# Patient Record
Sex: Female | Born: 1950
Health system: Southern US, Community
[De-identification: ages and names within clinical notes are randomized; demographics above are authoritative.]

## PROBLEM LIST (undated history)

## (undated) DIAGNOSIS — I10 Essential (primary) hypertension: Secondary | ICD-10-CM

## (undated) DIAGNOSIS — Z992 Dependence on renal dialysis: Secondary | ICD-10-CM

## (undated) DIAGNOSIS — I219 Acute myocardial infarction, unspecified: Secondary | ICD-10-CM

## (undated) DIAGNOSIS — R7303 Prediabetes: Secondary | ICD-10-CM

## (undated) HISTORY — PX: TUBAL LIGATION: SHX77

## (undated) HISTORY — PX: ABDOMINAL EXPLORATION SURGERY: SHX538

---

## 2015-08-17 ENCOUNTER — Emergency Department (HOSPITAL_COMMUNITY): Payer: Self-pay

## 2015-08-17 ENCOUNTER — Emergency Department (HOSPITAL_COMMUNITY)
Admission: EM | Admit: 2015-08-17 | Discharge: 2015-08-17 | Disposition: A | Discharge status: Home / Self Care | Payer: Self-pay | Attending: Emergency Medicine | Admitting: Emergency Medicine

## 2015-08-17 ENCOUNTER — Encounter (HOSPITAL_COMMUNITY): Payer: Self-pay | Admitting: Emergency Medicine

## 2015-08-17 DIAGNOSIS — J9809 Other diseases of bronchus, not elsewhere classified: Secondary | ICD-10-CM

## 2015-08-17 DIAGNOSIS — E119 Type 2 diabetes mellitus without complications: Secondary | ICD-10-CM | POA: Insufficient documentation

## 2015-08-17 DIAGNOSIS — E040 Nontoxic diffuse goiter: Secondary | ICD-10-CM | POA: Insufficient documentation

## 2015-08-17 DIAGNOSIS — J40 Bronchitis, not specified as acute or chronic: Secondary | ICD-10-CM | POA: Insufficient documentation

## 2015-08-17 DIAGNOSIS — I10 Essential (primary) hypertension: Secondary | ICD-10-CM | POA: Insufficient documentation

## 2015-08-17 DIAGNOSIS — R911 Solitary pulmonary nodule: Secondary | ICD-10-CM | POA: Insufficient documentation

## 2015-08-17 HISTORY — DX: Essential (primary) hypertension: I10

## 2015-08-17 LAB — BASIC METABOLIC PANEL
ANION GAP: 8 (ref 5–15)
BUN: 22 mg/dL — ABNORMAL HIGH (ref 6–20)
CALCIUM: 8.9 mg/dL (ref 8.9–10.3)
CO2: 27 mmol/L (ref 22–32)
CREATININE: 1.33 mg/dL — AB (ref 0.44–1.00)
Chloride: 105 mmol/L (ref 101–111)
GFR, EST AFRICAN AMERICAN: 49 mL/min — AB (ref >60)
GFR, EST NON AFRICAN AMERICAN: 42 mL/min — AB (ref >60)
Glucose, Bld: 196 mg/dL — ABNORMAL HIGH (ref 65–99)
Potassium: 4.3 mmol/L (ref 3.5–5.1)
SODIUM: 140 mmol/L (ref 135–145)

## 2015-08-17 LAB — I-STAT TROPONIN, ED: TROPONIN I, POC: 0.01 ng/mL (ref 0.00–0.08)

## 2015-08-17 LAB — CBC
HCT: 38.1 % (ref 36.0–46.0)
HEMOGLOBIN: 12.6 g/dL (ref 12.0–15.0)
MCH: 28.9 pg (ref 26.0–34.0)
MCHC: 33.1 g/dL (ref 30.0–36.0)
MCV: 87.4 fL (ref 78.0–100.0)
PLATELETS: 276 10*3/uL (ref 150–400)
RBC: 4.36 MIL/uL (ref 3.87–5.11)
RDW: 12.8 % (ref 11.5–15.5)
WBC: 7.6 10*3/uL (ref 4.0–10.5)

## 2015-08-17 LAB — D-DIMER, QUANTITATIVE (NOT AT ARMC): D DIMER QUANT: 0.56 ug{FEU}/mL — AB (ref 0.00–0.50)

## 2015-08-17 MED ORDER — SODIUM CHLORIDE 0.9 % IV BOLUS (SEPSIS)
1000.0000 mL | Freq: Once | INTRAVENOUS | Status: AC
  Administered 2015-08-17: 1000 mL via INTRAVENOUS

## 2015-08-17 MED ORDER — ALBUTEROL SULFATE (2.5 MG/3ML) 0.083% IN NEBU
2.5000 mg | INHALATION_SOLUTION | Freq: Four times a day (QID) | RESPIRATORY_TRACT | Status: DC | PRN
Start: 2015-08-17 — End: 2018-03-17

## 2015-08-17 MED ORDER — IPRATROPIUM-ALBUTEROL 0.5-2.5 (3) MG/3ML IN SOLN
3.0000 mL | Freq: Once | RESPIRATORY_TRACT | Status: AC
  Administered 2015-08-17: 3 mL via RESPIRATORY_TRACT
  Filled 2015-08-17: qty 3

## 2015-08-17 MED ORDER — ALBUTEROL SULFATE HFA 108 (90 BASE) MCG/ACT IN AERS: 1.0000 | INHALATION_SPRAY | Freq: Four times a day (QID) | RESPIRATORY_TRACT | Status: DC | PRN

## 2015-08-17 MED ORDER — IOHEXOL 350 MG/ML SOLN
100.0000 mL | Freq: Once | INTRAVENOUS | Status: AC | PRN
  Administered 2015-08-17: 100 mL via INTRAVENOUS

## 2015-08-17 NOTE — ED Notes (Signed)
Erika Bates reports difficult IV stick; this RN unsuccessful attempt to right hand. IV team consult in place.

## 2015-08-17 NOTE — ED Provider Notes (Signed)
CSN: [id number]     Arrival date & time 08/17/15  1421 History   First MD Initiated Contact with Patient 08/17/15 1559     Chief Complaint  Patient presents with  . Chest Pain  . Shortness of Breath     (Consider location/radiation/quality/duration/timing/severity/associated sxs/prior Treatment) The history is provided by the patient.  Telecia Larocque is a 64 y.o. female hx of HTN, DM, who presented with shortness of breath. She just moved here from a [place] close to [place]. Acute onset of shortness of breath since yesterday. Associated with some diaphoresis. Also has nonproductive cough. Denies any fevers or chills. No known CAD or cardiac stents. She did run out of her albuterol.   Past Medical History  Diagnosis Date  . Hypertension   . Diabetes mellitus without complication (HCC)    History reviewed. No pertinent past surgical history. History reviewed. No pertinent family history. Social History  Substance Use Topics  . Smoking status: Never Smoker   . Smokeless tobacco: None  . Alcohol Use: No   OB History    No data available     Review of Systems  Respiratory: Positive for shortness of breath.   All other systems reviewed and are negative.     Allergies  Review of patient's allergies indicates no known allergies.  Home Medications   Prior to Admission medications   Medication Sig Start Date End Date Taking? Authorizing Provider  DM-Doxylamine-Acetaminophen (NYQUIL COLD & FLU PO) Take 15 mLs by mouth every 6 (six) hours as needed (cold symptoms).   Yes Historical Provider, MD   BP 127/69 mmHg  Pulse 65  Resp 20  SpO2 100% Physical Exam  Constitutional: She is oriented to person, place, and time. She appears well-developed and well-nourished.  HENT:  Head: Normocephalic.  Eyes: Conjunctivae are normal. Pupils are equal, round, and reactive to light.  Neck: Normal range of motion. Neck supple.  Cardiovascular: Normal rate, regular rhythm and  normal heart sounds.   Pulmonary/Chest:  Diminished breath sounds. No wheezing   Abdominal: Soft. Bowel sounds are normal. She exhibits no distension. There is no tenderness. There is no rebound.  Musculoskeletal: Normal range of motion. She exhibits no edema or tenderness.  Neurological: She is alert and oriented to person, place, and time. Coordination normal.  Skin: Skin is warm and dry.  Psychiatric: She has a normal mood and affect. Her behavior is normal. Judgment and thought content normal.  Nursing note and vitals reviewed.   ED Course  Procedures (including critical care time) Labs Review Labs Reviewed  BASIC METABOLIC PANEL - Abnormal; Notable for the following:    Glucose, Bld 196 (*)    BUN 22 (*)    Creatinine, Ser 1.33 (*)    GFR calc non Af Amer 42 (*)    GFR calc Af Amer 49 (*)    All other components within normal limits  D-DIMER, QUANTITATIVE (NOT AT [facility]) - Abnormal; Notable for the following:    D-Dimer, Quant 0.56 (*)    All other components within normal limits  CBC  I-STAT TROPOININ, ED    Imaging Review Dg Chest 2 View  08/17/2015  CLINICAL DATA:  Cough for 1 week with worsening shortness of breath. Initial encounter. EXAM: CHEST  2 VIEW COMPARISON:  None. FINDINGS: A calcified granuloma is seen in the left lung. The lungs are otherwise clear. Heart size is normal. No pneumothorax or pleural effusion. No focal bony abnormality. IMPRESSION: No acute disease. Electronically Signed  By: Drusilla Kanner M.D.   On: 08/17/2015 15:35   Ct Angio Chest Pe W/cm &/or Wo Cm  08/17/2015  CLINICAL DATA:  Patient with intermittent left-sided chest pain radiating to the thorax. Shortness of breath. EXAM: CT ANGIOGRAPHY CHEST WITH CONTRAST TECHNIQUE: Multidetector CT imaging of the chest was performed using the standard protocol during bolus administration of intravenous contrast. Multiplanar CT image reconstructions and MIPs were obtained to evaluate the vascular  anatomy. CONTRAST:  OMNIPAQUE IOHEXOL 350 MG/ML SOLN COMPARISON:  Chest radiograph earlier same date. FINDINGS: Mediastinum/Nodes: No enlarged axillary, mediastinal or hilar lymphadenopathy. Normal heart size. No pericardial effusion. Small hiatal hernia. Adequate opacification the main pulmonary artery. No evidence for pulmonary embolism. Lungs/Pleura: Central airways are patent. There is mosaic attenuation to the lungs bilaterally. Within the right upper lobe there is a 7 mm nodule (image 79; series 10). Additionally there is an adjacent 6 mm right upper lobe nodule (image 76; series 10). Within the left upper lobe there is a low attenuation branching tubular structure (image 78; series 10), most compatible with mucous filled bronchus/bronchocele. There is a 14 mm calcified granuloma within the left lower lobe. No pleural effusion or pneumothorax. Upper abdomen: Unremarkable Musculoskeletal: No aggressive or acute appearing osseous lesions. Review of the MIP images confirms the above findings. IMPRESSION: No evidence for acute pulmonary embolism. Mosaic attenuation to the lungs. This may be secondary to small airways disease versus vascular etiology. Favor small airways disease. Consider followup evaluation with high-resolution chest CT including inspiration and expiration imaging. Right upper lobe pulmonary nodules measuring up to 7 mm. Recommend follow-up chest CT in 3 months. This recommendation follows the consensus statement: Guidelines for Management of Small Pulmonary Nodules Detected on CT Scans: A Statement from the Fleischner Society as published in Radiology 2005; 237:395-400. Low-density branching tubular structure within the left upper lobe favored to represent a bronchocele. Attention on above recommended followup chest CTs is recommended. Electronically Signed   By: Annia Belt M.D.   On: 08/17/2015 21:20   I have personally reviewed and evaluated these images and lab results as part of my  medical decision-making.   EKG Interpretation   Date/Time:  Friday August 17 2015 14:35:48 EST Ventricular Rate:  75 PR Interval:  166 QRS Duration: 74 QT Interval:  380 QTC Calculation: 424 R Axis:   28 Text Interpretation:  Sinus rhythm Low voltage, precordial leads No old  tracing to compare Confirmed by KNAPP  MD-J, JON ([id number]) on 08/17/2015  2:43:51 PM      MDM   Final diagnoses:  None    Anysha Nordness is a 64 y.o. female here with shortness of breath, chest pain. She traveled here recently. Consider ACS (symptoms for a day so trop x 1 sufficient), bronchitis, pneumonia, PE. Will get labs, d-dimer, trop x 1, CXR.  9:30 PM D-dimer mildly elevated. CT showed no PE. Has pulmonary nodules and bronchocele. No wheezing and has improved air movement. Will dc home with prn albuterol. No need for steroids currently.     Richardean Canal, MD 08/17/15 2130

## 2015-08-17 NOTE — Discharge Instructions (Signed)
Use either albuterol pump or nebulizer every 4 hrs for shortness of breath.   You have lung nodule that needs to be followed up with imaging in several months.   See your doctor.   Return to ER if you have worse shortness of breath, chest pain, fevers.

## 2015-08-17 NOTE — ED Notes (Signed)
Pt c/o intermittent left side CP radiating through thorax to back, onset last night with SOB worsening on laying down.

## 2015-08-17 NOTE — Progress Notes (Signed)
EDCM spoke to patient and her daughter at bedside. Patient does not speak english.  Patient's daughter confirms patient does not have a pcp or insurance living in [place].  Patient is visiting here. [facility] provided patient with pamphlet to [facility], informed patient of services there.  EDCM also provided patient with list of pcps who accept self pay patients, list of discount pharmacies and websites needymeds.org and GoodRX.com for medication assistance, phone number to inquire about the orange card, phone number to inquire about Mediciad, phone number to inquire about the Affordable Care Act, financial resources in the community such as local churches, salvation army, urban ministries, and dental assistance for uninsured patients.  Patient thankful for resources.  No further EDCM needs at this time.

## 2015-08-17 NOTE — ED Notes (Signed)
Patient calls out for worsening SOB. Lung sounds clear throughout, VS stable. Pt diaphoretic in cold room, was not diaphoretic on initial exam.

## 2017-07-22 ENCOUNTER — Emergency Department (HOSPITAL_COMMUNITY): Payer: Self-pay

## 2017-07-22 ENCOUNTER — Emergency Department (HOSPITAL_COMMUNITY)
Admission: EM | Admit: 2017-07-22 | Discharge: 2017-07-22 | Disposition: A | Discharge status: Home / Self Care | Payer: Self-pay | Attending: Emergency Medicine | Admitting: Emergency Medicine

## 2017-07-22 ENCOUNTER — Encounter (HOSPITAL_COMMUNITY): Payer: Self-pay | Admitting: *Deleted

## 2017-07-22 DIAGNOSIS — N183 Chronic kidney disease, stage 3 unspecified: Secondary | ICD-10-CM

## 2017-07-22 DIAGNOSIS — M5442 Lumbago with sciatica, left side: Secondary | ICD-10-CM | POA: Insufficient documentation

## 2017-07-22 DIAGNOSIS — E119 Type 2 diabetes mellitus without complications: Secondary | ICD-10-CM | POA: Insufficient documentation

## 2017-07-22 DIAGNOSIS — I10 Essential (primary) hypertension: Secondary | ICD-10-CM | POA: Insufficient documentation

## 2017-07-22 DIAGNOSIS — N12 Tubulo-interstitial nephritis, not specified as acute or chronic: Secondary | ICD-10-CM | POA: Insufficient documentation

## 2017-07-22 LAB — URINALYSIS, ROUTINE W REFLEX MICROSCOPIC
BILIRUBIN URINE: NEGATIVE
Glucose, UA: NEGATIVE mg/dL
KETONES UR: NEGATIVE mg/dL
LEUKOCYTES UA: NEGATIVE
Nitrite: POSITIVE — AB
Protein, ur: 30 mg/dL — AB
SPECIFIC GRAVITY, URINE: 1.012 (ref 1.005–1.030)
pH: 5 (ref 5.0–8.0)

## 2017-07-22 LAB — BASIC METABOLIC PANEL
ANION GAP: 8 (ref 5–15)
BUN: 13 mg/dL (ref 6–20)
CALCIUM: 9.3 mg/dL (ref 8.9–10.3)
CO2: 24 mmol/L (ref 22–32)
Chloride: 106 mmol/L (ref 101–111)
Creatinine, Ser: 1.05 mg/dL — ABNORMAL HIGH (ref 0.44–1.00)
GFR calc Af Amer: >60 mL/min (ref >60)
GFR, EST NON AFRICAN AMERICAN: 55 mL/min — AB (ref >60)
GLUCOSE: 115 mg/dL — AB (ref 65–99)
POTASSIUM: 3.8 mmol/L (ref 3.5–5.1)
Sodium: 138 mmol/L (ref 135–145)

## 2017-07-22 LAB — CBC WITH DIFFERENTIAL/PLATELET
BASOS ABS: 0 10*3/uL (ref 0.0–0.1)
BASOS PCT: 0 %
EOS ABS: 0.3 10*3/uL (ref 0.0–0.7)
Eosinophils Relative: 4 %
HCT: 38.3 % (ref 36.0–46.0)
HEMOGLOBIN: 13 g/dL (ref 12.0–15.0)
Lymphocytes Relative: 54 %
Lymphs Abs: 4.1 10*3/uL — ABNORMAL HIGH (ref 0.7–4.0)
MCH: 28.8 pg (ref 26.0–34.0)
MCHC: 33.9 g/dL (ref 30.0–36.0)
MCV: 84.9 fL (ref 78.0–100.0)
Monocytes Absolute: 0.6 10*3/uL (ref 0.1–1.0)
Monocytes Relative: 7 %
NEUTROS ABS: 2.7 10*3/uL (ref 1.7–7.7)
NEUTROS PCT: 35 %
PLATELETS: 254 10*3/uL (ref 150–400)
RBC: 4.51 MIL/uL (ref 3.87–5.11)
RDW: 13 % (ref 11.5–15.5)
WBC: 7.7 10*3/uL (ref 4.0–10.5)

## 2017-07-22 MED ORDER — CEPHALEXIN 500 MG PO CAPS: 500.0000 mg | ORAL_CAPSULE | Freq: Three times a day (TID) | ORAL | 0 refills | Status: AC

## 2017-07-22 NOTE — ED Provider Notes (Addendum)
[facility] EMERGENCY DEPARTMENT Provider Note   CSN: [id number] Arrival date & time: 07/22/17  4098     History   Chief Complaint Chief Complaint  Patient presents with  . Leg Pain  . Back Pain    HPI Erika Bates is a 66 y.o. female who presents for evaluation of Left lower lateral back pain that occasionally radiates down her leg. She reports that her pain has been gradually worsening for three days.  She denies any dysuria, does endorse increased frequency and urgency.  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  She does not know what her diagnosis was.  She denies any numbness or tingling to her bilateral legs or genitals.  No changes to bowel or bladder function.    She says that she does not normally have lower back pain at baseline and this is not normal for her.   HPI  Past Medical History:  Diagnosis Date  . Diabetes mellitus without complication (HCC)   . Hypertension     Patient Active Problem List   Diagnosis Date Noted  . History of kidney problems 07/22/2017    History reviewed. No pertinent surgical history.  OB History    No data available       Home Medications    Prior to Admission medications   Medication Sig Start Date End Date Taking? Authorizing Provider  albuterol (PROVENTIL HFA;VENTOLIN HFA) 108 (90 BASE) MCG/ACT inhaler Inhale 1-2 puffs into the lungs every 6 (six) hours as needed for wheezing or shortness of breath. 08/17/15   Charlynne Pander, MD  albuterol (PROVENTIL) (2.5 MG/3ML) 0.083% nebulizer solution Take 3 mLs (2.5 mg total) by nebulization every 6 (six) hours as needed for wheezing or shortness of breath. 08/17/15   Charlynne Pander, MD  cephALEXin (KEFLEX) 500 MG capsule Take 1 capsule (500 mg total) by mouth 3 (three) times daily. 07/22/17 08/05/17  Cristina Gong, PA-C  DM-Doxylamine-Acetaminophen (NYQUIL COLD  & FLU PO) Take 15 mLs by mouth every 6 (six) hours as needed (cold symptoms).    [provider]    Family History No family history on file.  Social History Social History  Substance Use Topics  . Smoking status: Never Smoker  . Smokeless tobacco: Not on file  . Alcohol use No     Allergies   Patient has no known allergies.   Review of Systems Review of Systems  Constitutional: Negative for chills and fever.  HENT: Negative for ear pain and sore throat.   Eyes: Negative for pain and visual disturbance.  Respiratory: Negative for cough and shortness of breath.   Cardiovascular: Negative for chest pain and palpitations.  Gastrointestinal: Negative for abdominal pain and vomiting.  Genitourinary: Positive for frequency and urgency. Negative for dysuria and hematuria.  Musculoskeletal: Positive for back pain. Negative for arthralgias.       Leg pain  Skin: Negative for color change and rash.  Neurological: Negative for seizures, syncope and numbness.  All other systems reviewed and are negative.    Physical Exam Updated Vital Signs BP (!) 147/72 (BP Location: Right Arm)   Pulse 61   Temp 97.6 F (36.4 C)   Resp 16   SpO2 98%   Physical Exam  Constitutional: She appears well-developed and well-nourished. No distress.  HENT:  Head: Normocephalic and atraumatic.  Eyes: Conjunctivae are normal.  Neck:  Neck supple.  Cardiovascular: Normal rate and regular rhythm.  No murmur heard. Pulmonary/Chest: Effort normal and breath sounds normal. No respiratory distress.  Abdominal: Soft. Normal appearance and bowel sounds are normal. She exhibits no distension. There is no tenderness. There is CVA tenderness (on the left side. ).  Musculoskeletal: Normal range of motion. She exhibits no edema.  5/5 strength to bilateral upper and lower extremities including plantar dorsi-flexion, knee, hip, elbow, and wrists.   She has TTP over lumbar paraspinal muscles.  No midline  lumbar tenderness.   Neurological: She is alert.  Sensation intact to bilateral lower extremities.    Skin: Skin is warm and dry.  Psychiatric: She has a normal mood and affect.  Nursing note and vitals reviewed.    ED Treatments / Results  Labs (all labs ordered are listed, but only abnormal results are displayed) Labs Reviewed  URINE CULTURE - Abnormal; Notable for the following:       Result Value   Culture   (*)    Value: >=100,000 COLONIES/mL ESCHERICHIA COLI SUSCEPTIBILITIES TO FOLLOW    All other components within normal limits  URINALYSIS, ROUTINE W REFLEX MICROSCOPIC - Abnormal; Notable for the following:    APPearance HAZY (*)    Hgb urine dipstick SMALL (*)    Protein, ur 30 (*)    Nitrite POSITIVE (*)    Bacteria, UA RARE (*)    Squamous Epithelial / LPF 0-5 (*)    All other components within normal limits  CBC WITH DIFFERENTIAL/PLATELET - Abnormal; Notable for the following:    Lymphs Abs 4.1 (*)    All other components within normal limits  BASIC METABOLIC PANEL - Abnormal; Notable for the following:    Glucose, Bld 115 (*)    Creatinine, Ser 1.05 (*)    GFR calc non Af Amer 55 (*)    All other components within normal limits    EKG  EKG Interpretation None       Radiology Dg Lumbar Spine Complete  Result Date: 07/22/2017 CLINICAL DATA:  Increasing lower back pain that radiates into the left hip and upper leg. Symptoms for several days. No known injury. EXAM: LUMBAR SPINE - COMPLETE 4+ VIEW COMPARISON:  None. FINDINGS: No convincing fracture; superior endplate concavity at L5 has a chronic smooth appearance. Mild spurring and L2-3 disc narrowing. Slight levocurvature. No listhesis. Prominent osteopenia. The lowest lumbar vertebra has a pseudoarticulation with the sacrum at its right transverse process. IMPRESSION: 1. No acute finding. 2. Osteopenia. Electronically Signed   By: Marnee Spring M.D.   On: 07/22/2017 13:09    Procedures Procedures  (including critical care time)  Medications Ordered in ED Medications - No data to display   Initial Impression / Assessment and Plan / ED Course  I have reviewed the triage vital signs and the nursing notes.  Pertinent labs & imaging results that were available during my care of the patient were reviewed by me and considered in my medical decision making (see chart for details).  Clinical Course as of Jul 23 1617  Wed Jul 22, 2017  1246 Attempted to see patient, she is not in room.   [EH]    Clinical Course User Index [EH] Cristina Gong, PA-C   Patient with back pain.  No neurological deficits and normal neuro exam.  Patient can walk but states is painful.  No loss of bowel or bladder control.  No concern for cauda equina.  No fever, night sweats, weight  loss, h/o cancer, IVDU.  RICE protocol and pain medicine indicated and discussed with patient. On UA she was found to have urine that was hazy, small blood, nitrite positive, with 0-5 wbc.  Given that she has back pain I feel this represents pyelonephritis.  Given her history of requiring dialysis and kidney problems in the past along with chills a CBC and a BMP were obtained.  Her Cr is better than her last set of labs and is slightly outside the reference range but essentially normal.  WBC with out leukocytosis or neutropenia.  She is afebrile, not tachycardic.  Does not meet sepsis criteria.  I feel like she is safe for outpatient trial of antibiotics.  She was given strict return precautions for both her back pain and pyelonephritis and states her understanding.      Final Clinical Impressions(s) / ED Diagnoses   Final diagnoses:  Acute left-sided low back pain with left-sided sciatica  Pyelonephritis    New Prescriptions Discharge Medication List as of 07/22/2017  4:13 PM    START taking these medications   Details  cephALEXin (KEFLEX) 500 MG capsule Take 1 capsule (500 mg total) by mouth 3 (three) times daily.,  Starting Wed 07/22/2017, Until Wed 08/05/2017, Print         Cristina GongHammond, Damarion Mendizabal W, PA-C 07/23/17 1624    Cathren LaineSteinl, Kevin, MD 07/24/17 1405    Cristina GongHammond, Maylee Bare W, PA-C 08/04/17 1511    Cathren LaineSteinl, Kevin, MD 08/07/17 1240

## 2017-07-22 NOTE — ED Notes (Signed)
Patient transported to X-ray 

## 2017-07-22 NOTE — Discharge Instructions (Signed)
Please take Tylenol (acetaminophen) to relieve your pain.  You may take tylenol, up to 1,000 mg (two extra strength pills).  Do not take more than 3,000 mg tylenol in a 24 hour period.  Please check all medication labels as many medications such as pain and cold medications may contain tylenol. Please do not drink alcohol while taking this medication.  ° °You may have diarrhea from the antibiotics.  It is very important that you continue to take the antibiotics even if you get diarrhea unless a medical professional tells you that you may stop taking them.  If you stop too early the bacteria you are being treated for will become stronger and you may need different, more powerful antibiotics that have more side effects and worsening diarrhea.  Please stay well hydrated and consider probiotics as they may decrease the severity of your diarrhea.   ° °

## 2017-07-22 NOTE — ED Triage Notes (Signed)
To ED for eval of lower back pain, left hip pain, and left leg pain for the past 3 days. Pt states the pain is worse with walking. No injury noted. Denies difficulty with urination or bm

## 2017-07-24 LAB — URINE CULTURE

## 2017-07-25 ENCOUNTER — Telehealth: Payer: Self-pay

## 2017-07-25 NOTE — Progress Notes (Signed)
ED Antimicrobial Stewardship Positive Culture Follow Up   Erika Bates is an 66 y.o. female who presented to [facility] on 07/22/2017 with a chief complaint of left lower lateral back pain that radiates to leg and dysuria. Chief Complaint  Patient presents with  . Leg Pain  . Back Pain    Recent Results (from the past 720 hour(s))  Urine culture     Status: Abnormal   Collection Time: 07/22/17  2:14 PM  Result Value Ref Range Status   Specimen Description URINE, CLEAN CATCH  Final   Special Requests NONE  Final   Culture >=100,000 COLONIES/mL ESCHERICHIA COLI (A)  Final   Report Status 07/24/2017 FINAL  Final   Organism ID, Bacteria ESCHERICHIA COLI (A)  Final      Susceptibility   Escherichia coli - MIC*    AMPICILLIN >=32 RESISTANT Resistant     CEFAZOLIN 32 INTERMEDIATE Intermediate     CEFTRIAXONE <=1 SENSITIVE Sensitive     CIPROFLOXACIN <=0.25 SENSITIVE Sensitive     GENTAMICIN <=1 SENSITIVE Sensitive     IMIPENEM <=0.25 SENSITIVE Sensitive     NITROFURANTOIN 32 SENSITIVE Sensitive     TRIMETH/SULFA >=320 RESISTANT Resistant     AMPICILLIN/SULBACTAM >=32 RESISTANT Resistant     PIP/TAZO <=4 SENSITIVE Sensitive     Extended ESBL NEGATIVE Sensitive     * >=100,000 COLONIES/mL ESCHERICHIA COLI    [x]  Treated with cephalexin 500 mg TID, organism resistant to prescribed antimicrobial   New antibiotic prescription: Ciprofloxacin 500 mg twice daily x 14 days   ED Provider: Dietrich Pates, PA-C   Vinnie Level, PharmD., BCPS Clinical Pharmacist Pager [phone]

## 2017-07-25 NOTE — Telephone Encounter (Incomplete)
Post ED Visit - Positive Culture Follow-up: Successful Patient Follow-Up  Culture assessed and recommendations reviewed by: []  Enzo Bi, Pharm.D. []  Celedonio Miyamoto, Pharm.D., BCPS AQ-ID []  Garvin Fila, Pharm.D., BCPS []  Georgina Pillion, Pharm.D., BCPS []  [place], [address].D., BCPS, AAHIVP []  Estella Husk, Pharm.D., BCPS, AAHIVP []  Lysle Pearl, PharmD, BCPS []  Casilda Carls, PharmD, BCPS []  Pollyann Samples, PharmD, BCPS [facility] Pharm D Positive urine culture  []  Patient discharged without antimicrobial prescription and treatment is now indicated [x]  Organism is resistant to prescribed ED discharge antimicrobial []  Patient with positive blood cultures  Changes discussed with ED provider: Dietrich Pates PA New antibiotic prescription Ciprofloxacin 500 mg BID x 14 days Called to [facility] city/[address] [phone]  Contacted patient, date 07/25/17, time 1201   Erika Bates, Linnell Fulling 07/25/2017, 11:55 AM

## 2017-12-16 ENCOUNTER — Emergency Department (HOSPITAL_COMMUNITY)
Admission: EM | Admit: 2017-12-16 | Discharge: 2017-12-16 | Discharge status: Against Medical Advice | Payer: Self-pay | Attending: Emergency Medicine | Admitting: Emergency Medicine

## 2017-12-16 ENCOUNTER — Encounter (HOSPITAL_COMMUNITY): Payer: Self-pay | Admitting: Emergency Medicine

## 2017-12-16 DIAGNOSIS — Z5321 Procedure and treatment not carried out due to patient leaving prior to being seen by health care provider: Secondary | ICD-10-CM | POA: Insufficient documentation

## 2017-12-16 NOTE — ED Triage Notes (Signed)
Per [facility] pt from home for abd pain (RUQ) that started today after having a "coughing fit". Is worse with palpation. Denies vomiting.

## 2017-12-16 NOTE — ED Notes (Signed)
Pt was stuck twice in triage by phlebotomy in each ac. There was a flash of blood but no flow. Pt's veins are small and apparently roll for blood draw. Pt also stated to phlebotomy, " when I was in [place] I had dialysis".

## 2018-01-27 DIAGNOSIS — I219 Acute myocardial infarction, unspecified: Secondary | ICD-10-CM

## 2018-01-27 HISTORY — DX: Acute myocardial infarction, unspecified: I21.9

## 2018-02-11 ENCOUNTER — Other Ambulatory Visit: Payer: Self-pay

## 2018-02-11 ENCOUNTER — Encounter (HOSPITAL_COMMUNITY): Payer: Self-pay | Admitting: Emergency Medicine

## 2018-02-11 DIAGNOSIS — J0100 Acute maxillary sinusitis, unspecified: Secondary | ICD-10-CM | POA: Insufficient documentation

## 2018-02-11 DIAGNOSIS — R51 Headache: Secondary | ICD-10-CM | POA: Insufficient documentation

## 2018-02-11 NOTE — ED Triage Notes (Signed)
Pt from home with c/o headache that radiates from head to back that began last night. Pt denies deficits and none are visible. Unremarkable neuro exam and pupils are equal and responsive. Pt states headache began when she lifted grandchild last night. Pt report sensitivity to light.

## 2018-02-12 ENCOUNTER — Emergency Department (HOSPITAL_COMMUNITY)
Admission: EM | Admit: 2018-02-12 | Discharge: 2018-02-12 | Disposition: A | Discharge status: Home / Self Care | Payer: Self-pay | Attending: Emergency Medicine | Admitting: Emergency Medicine

## 2018-02-12 ENCOUNTER — Emergency Department (HOSPITAL_COMMUNITY): Payer: Self-pay

## 2018-02-12 DIAGNOSIS — G4489 Other headache syndrome: Secondary | ICD-10-CM

## 2018-02-12 DIAGNOSIS — J0100 Acute maxillary sinusitis, unspecified: Secondary | ICD-10-CM

## 2018-02-12 LAB — CBC WITH DIFFERENTIAL/PLATELET
BASOS ABS: 0.1 10*3/uL (ref 0.0–0.1)
BASOS PCT: 1 %
Eosinophils Absolute: 0.5 10*3/uL (ref 0.0–0.7)
Eosinophils Relative: 6 %
HCT: 39.6 % (ref 36.0–46.0)
Hemoglobin: 13.3 g/dL (ref 12.0–15.0)
Lymphocytes Relative: 52 %
Lymphs Abs: 4.3 10*3/uL — ABNORMAL HIGH (ref 0.7–4.0)
MCH: 29 pg (ref 26.0–34.0)
MCHC: 33.6 g/dL (ref 30.0–36.0)
MCV: 86.3 fL (ref 78.0–100.0)
MONO ABS: 0.6 10*3/uL (ref 0.1–1.0)
MONOS PCT: 7 %
Neutro Abs: 2.8 10*3/uL (ref 1.7–7.7)
Neutrophils Relative %: 34 %
Platelets: 259 10*3/uL (ref 150–400)
RBC: 4.59 MIL/uL (ref 3.87–5.11)
RDW: 13 % (ref 11.5–15.5)
WBC: 8.1 10*3/uL (ref 4.0–10.5)

## 2018-02-12 LAB — BASIC METABOLIC PANEL
Anion gap: 9 (ref 5–15)
BUN: 16 mg/dL (ref 6–20)
CALCIUM: 9.4 mg/dL (ref 8.9–10.3)
CO2: 26 mmol/L (ref 22–32)
Chloride: 102 mmol/L (ref 101–111)
Creatinine, Ser: 1.21 mg/dL — ABNORMAL HIGH (ref 0.44–1.00)
GFR calc Af Amer: 54 mL/min — ABNORMAL LOW (ref >60)
GFR, EST NON AFRICAN AMERICAN: 46 mL/min — AB (ref >60)
GLUCOSE: 157 mg/dL — AB (ref 65–99)
Potassium: 3.9 mmol/L (ref 3.5–5.1)
Sodium: 137 mmol/L (ref 135–145)

## 2018-02-12 LAB — SEDIMENTATION RATE: SED RATE: 37 mm/h — AB (ref 0–22)

## 2018-02-12 MED ORDER — DIPHENHYDRAMINE HCL 50 MG/ML IJ SOLN
25.0000 mg | Freq: Once | INTRAMUSCULAR | Status: AC
  Administered 2018-02-12: 25 mg via INTRAVENOUS
  Filled 2018-02-12: qty 1

## 2018-02-12 MED ORDER — METOCLOPRAMIDE HCL 5 MG/ML IJ SOLN
10.0000 mg | Freq: Once | INTRAMUSCULAR | Status: AC
  Administered 2018-02-12: 10 mg via INTRAVENOUS
  Filled 2018-02-12: qty 2

## 2018-02-12 MED ORDER — AMOXICILLIN 500 MG PO CAPS: 500.0000 mg | ORAL_CAPSULE | Freq: Three times a day (TID) | ORAL | 0 refills | Status: DC

## 2018-02-12 NOTE — ED Provider Notes (Signed)
[facility]-EMERGENCY DEPT Provider Note   CSN: [id number] Arrival date & time: 02/11/18  2149     History   Chief Complaint Chief Complaint  Patient presents with  . Headache    HPI Erika Bates is a 67 y.o. female.  The history is provided by the patient and a relative.  Headache   This is a new problem. The current episode started 12 to 24 hours ago. The problem occurs constantly. The problem has been gradually worsening. The pain is moderate. The pain radiates to the upper back. Associated symptoms include malaise/fatigue. Pertinent negatives include no fever, no nausea and no vomiting. She has tried acetaminophen for the symptoms. The treatment provided no relief.  pt reports onset of HA over 24 hrs ago It is gradually worsening It is throughout the head and radiates into back No fever/vomiting No focal weakness No confusion per family No recent trauma She thought it started after picking up her grandchild No previous history of stroke   History is provided by daughter.  Patient is [place] and no immediate medical interpreter available  Past Medical History:  Diagnosis Date  . Diabetes mellitus without complication (HCC)    prediabetic   . Hypertension     Patient Active Problem List   Diagnosis Date Noted  . History of kidney problems 07/22/2017    History reviewed. No pertinent surgical history.   OB History   None      Home Medications    Prior to Admission medications   Medication Sig Start Date End Date Taking? Authorizing Provider  albuterol (PROVENTIL HFA;VENTOLIN HFA) 108 (90 BASE) MCG/ACT inhaler Inhale 1-2 puffs into the lungs every 6 (six) hours as needed for wheezing or shortness of breath. Patient not taking: Reported on 02/12/2018 08/17/15   Charlynne Pander, MD  albuterol (PROVENTIL) (2.5 MG/3ML) 0.083% nebulizer solution Take 3 mLs (2.5 mg total) by nebulization every 6 (six) hours as needed for wheezing or  shortness of breath. Patient not taking: Reported on 02/12/2018 08/17/15   Charlynne Pander, MD    Family History No family history on file.  Social History Social History   Tobacco Use  . Smoking status: Never Smoker  . Smokeless tobacco: Never Used  Substance Use Topics  . Alcohol use: No  . Drug use: No     Allergies   Patient has no known allergies.   Review of Systems Review of Systems  Constitutional: Positive for malaise/fatigue. Negative for fever.  Cardiovascular: Negative for chest pain.  Gastrointestinal: Negative for nausea and vomiting.  Neurological: Positive for headaches. Negative for syncope and weakness.  All other systems reviewed and are negative.    Physical Exam Updated Vital Signs BP 118/73 (BP Location: Left Arm)   Pulse 61   Temp 98 F (36.7 C) (Oral)   Resp 20   SpO2 100%   Physical Exam CONSTITUTIONAL: Elderly, ill-appearing HEAD: Normocephalic/atraumatic EYES: EOMI/PERRL, no nystagmus, no ptosis ENMT: Mucous membranes moist NECK: supple no meningeal signs, no bruits SPINE/BACK:entire spine nontender CV: S1/S2 noted, no murmurs/rubs/gallops noted LUNGS: Lungs are clear to auscultation bilaterally, no apparent distress ABDOMEN: soft, nontender, no rebound or guarding GU:no cva tenderness NEURO:Awake/alert, face symmetric, no arm or leg drift is noted Equal 5/5 strength with shoulder abduction, elbow flex/extension, wrist flex/extension in upper extremities and equal hand grips bilaterally Equal 5/5 strength with hip flexion,knee flex/extension, foot dorsi/plantar flexion Cranial nerves 3/4/5/6/04/06/09/11/12 tested and intact EXTREMITIES: pulses normal, full ROM SKIN: warm, color  normal PSYCH: no abnormalities of mood noted, alert and oriented to situation    ED Treatments / Results  Labs (all labs ordered are listed, but only abnormal results are displayed) Labs Reviewed  BASIC METABOLIC PANEL - Abnormal; Notable for the  following components:      Result Value   Glucose, Bld 157 (*)    Creatinine, Ser 1.21 (*)    GFR calc non Af Amer 46 (*)    GFR calc Af Amer 54 (*)    All other components within normal limits  CBC WITH DIFFERENTIAL/PLATELET - Abnormal; Notable for the following components:   Lymphs Abs 4.3 (*)    All other components within normal limits  SEDIMENTATION RATE - Abnormal; Notable for the following components:   Sed Rate 37 (*)    All other components within normal limits    EKG None  Radiology Ct Head Wo Contrast  Result Date: 02/12/2018 CLINICAL DATA:  67 y/o F; 1 day of headache starting in the posterior head and moving to the frontal area. EXAM: CT HEAD WITHOUT CONTRAST TECHNIQUE: Contiguous axial images were obtained from the base of the skull through the vertex without intravenous contrast. COMPARISON:  None. FINDINGS: Brain: No evidence of acute infarction, hemorrhage, hydrocephalus, extra-axial collection or mass lesion/mass effect. Vascular: No hyperdense vessel or unexpected calcification. Skull: Normal. Negative for fracture or focal lesion. Sinuses/Orbits: Opacification of the left maxillary sinus with chronic inflammatory changes of the sinus walls. Normal aeration of the mastoid air cells. Orbits are unremarkable. Other: None. IMPRESSION: 1. No acute intracranial abnormality.  Unremarkable CT of the brain. 2. Left maxillary sinus opacification and chronic inflammatory changes. Electronically Signed   By: Mitzi Hansen M.D.   On: 02/12/2018 04:11    Procedures Procedures (including critical care time)  Medications Ordered in ED Medications  metoCLOPramide (REGLAN) injection 10 mg (10 mg Intravenous Given 02/12/18 0310)  diphenhydrAMINE (BENADRYL) injection 25 mg (25 mg Intravenous Given 02/12/18 0310)     Initial Impression / Assessment and Plan / ED Course  I have reviewed the triage vital signs and the nursing notes.  Pertinent labs  results that were  available during my care of the patient were reviewed by me and considered in my medical decision making (see chart for details).     3:04 AM Patient with headache without any neuro deficits.  Imaging and labs have been ordered as this is new onset. Patient and family are agreeable.  She is hemodynamically appropriate.  She is afebrile at this time.  No signs of stroke 6:26 AM Scan revealed sinusitis.  Patient appears improved.  She is ambulatory no distress.  No focal neuro deficits.  She is afebrile.  No signs of meningitis, or SAH.  My suspicion for acute neurologic emergency is low  Discussed with daughter plan.  Will place her on antibiotics  Final Clinical Impressions(s) / ED Diagnoses   Final diagnoses:  Other headache syndrome  Acute non-recurrent maxillary sinusitis    ED Discharge Orders        Ordered    amoxicillin (AMOXIL) 500 MG capsule  3 times daily     02/12/18 1610       Zadie Rhine, MD 02/12/18 [phone]

## 2018-02-15 ENCOUNTER — Emergency Department (HOSPITAL_COMMUNITY): Payer: Self-pay

## 2018-02-15 ENCOUNTER — Other Ambulatory Visit: Payer: Self-pay

## 2018-02-15 ENCOUNTER — Inpatient Hospital Stay (HOSPITAL_COMMUNITY)
Admission: EM | Admit: 2018-02-15 | Discharge: 2018-02-17 | DRG: 247 | Disposition: A | Discharge status: Home / Self Care | Payer: Self-pay | Attending: Cardiovascular Disease | Admitting: Cardiovascular Disease

## 2018-02-15 ENCOUNTER — Encounter (HOSPITAL_COMMUNITY): Payer: Self-pay | Admitting: Emergency Medicine

## 2018-02-15 DIAGNOSIS — N183 Chronic kidney disease, stage 3 unspecified: Secondary | ICD-10-CM | POA: Diagnosis present

## 2018-02-15 DIAGNOSIS — I214 Non-ST elevation (NSTEMI) myocardial infarction: Principal | ICD-10-CM | POA: Clinically undetermined

## 2018-02-15 DIAGNOSIS — Z8249 Family history of ischemic heart disease and other diseases of the circulatory system: Secondary | ICD-10-CM

## 2018-02-15 DIAGNOSIS — E669 Obesity, unspecified: Secondary | ICD-10-CM | POA: Diagnosis present

## 2018-02-15 DIAGNOSIS — Z794 Long term (current) use of insulin: Secondary | ICD-10-CM

## 2018-02-15 DIAGNOSIS — Z6836 Body mass index (BMI) 36.0-36.9, adult: Secondary | ICD-10-CM

## 2018-02-15 DIAGNOSIS — E119 Type 2 diabetes mellitus without complications: Secondary | ICD-10-CM

## 2018-02-15 DIAGNOSIS — I25110 Atherosclerotic heart disease of native coronary artery with unstable angina pectoris: Secondary | ICD-10-CM | POA: Diagnosis present

## 2018-02-15 DIAGNOSIS — Z955 Presence of coronary angioplasty implant and graft: Secondary | ICD-10-CM

## 2018-02-15 DIAGNOSIS — I200 Unstable angina: Secondary | ICD-10-CM | POA: Diagnosis present

## 2018-02-15 DIAGNOSIS — E1122 Type 2 diabetes mellitus with diabetic chronic kidney disease: Secondary | ICD-10-CM | POA: Diagnosis present

## 2018-02-15 DIAGNOSIS — N189 Chronic kidney disease, unspecified: Secondary | ICD-10-CM | POA: Diagnosis present

## 2018-02-15 DIAGNOSIS — I129 Hypertensive chronic kidney disease with stage 1 through stage 4 chronic kidney disease, or unspecified chronic kidney disease: Secondary | ICD-10-CM | POA: Diagnosis present

## 2018-02-15 DIAGNOSIS — E785 Hyperlipidemia, unspecified: Secondary | ICD-10-CM

## 2018-02-15 DIAGNOSIS — I10 Essential (primary) hypertension: Secondary | ICD-10-CM

## 2018-02-15 HISTORY — DX: Acute myocardial infarction, unspecified: I21.9

## 2018-02-15 HISTORY — DX: Prediabetes: R73.03

## 2018-02-15 HISTORY — DX: Dependence on renal dialysis: Z99.2

## 2018-02-15 LAB — HEMOGLOBIN A1C
HEMOGLOBIN A1C: 8.4 % — AB (ref 4.8–5.6)
MEAN PLASMA GLUCOSE: 194.38 mg/dL

## 2018-02-15 LAB — CBC WITH DIFFERENTIAL/PLATELET
Abs Immature Granulocytes: 0 10*3/uL (ref 0.0–0.1)
Basophils Absolute: 0.1 10*3/uL (ref 0.0–0.1)
Basophils Relative: 1 %
EOS ABS: 0.4 10*3/uL (ref 0.0–0.7)
Eosinophils Relative: 5 %
HEMATOCRIT: 41.5 % (ref 36.0–46.0)
Hemoglobin: 13.7 g/dL (ref 12.0–15.0)
Immature Granulocytes: 0 %
LYMPHS ABS: 3.6 10*3/uL (ref 0.7–4.0)
Lymphocytes Relative: 47 %
MCH: 27.7 pg (ref 26.0–34.0)
MCHC: 33 g/dL (ref 30.0–36.0)
MCV: 84 fL (ref 78.0–100.0)
MONOS PCT: 6 %
Monocytes Absolute: 0.4 10*3/uL (ref 0.1–1.0)
Neutro Abs: 3.2 10*3/uL (ref 1.7–7.7)
Neutrophils Relative %: 41 %
Platelets: 238 10*3/uL (ref 150–400)
RBC: 4.94 MIL/uL (ref 3.87–5.11)
RDW: 12.8 % (ref 11.5–15.5)
WBC: 7.7 10*3/uL (ref 4.0–10.5)

## 2018-02-15 LAB — BASIC METABOLIC PANEL
Anion gap: 11 (ref 5–15)
BUN: 12 mg/dL (ref 6–20)
CHLORIDE: 102 mmol/L (ref 101–111)
CO2: 21 mmol/L — ABNORMAL LOW (ref 22–32)
CREATININE: 1.23 mg/dL — AB (ref 0.44–1.00)
Calcium: 8.9 mg/dL (ref 8.9–10.3)
GFR calc Af Amer: 53 mL/min — ABNORMAL LOW (ref >60)
GFR calc non Af Amer: 45 mL/min — ABNORMAL LOW (ref >60)
Glucose, Bld: 183 mg/dL — ABNORMAL HIGH (ref 65–99)
Potassium: 4.4 mmol/L (ref 3.5–5.1)
SODIUM: 134 mmol/L — AB (ref 135–145)

## 2018-02-15 LAB — D-DIMER, QUANTITATIVE: D-Dimer, Quant: 0.35 ug/mL-FEU (ref 0.00–0.50)

## 2018-02-15 LAB — I-STAT TROPONIN, ED: Troponin i, poc: 0.96 ng/mL (ref 0.00–0.08)

## 2018-02-15 LAB — TROPONIN I
Troponin I: 1.47 ng/mL (ref <0.03)
Troponin I: 1.46 ng/mL (ref <0.03)

## 2018-02-15 LAB — GLUCOSE, CAPILLARY: GLUCOSE-CAPILLARY: 137 mg/dL — AB (ref 65–99)

## 2018-02-15 LAB — MRSA PCR SCREENING: MRSA BY PCR: NEGATIVE

## 2018-02-15 LAB — MAGNESIUM: Magnesium: 1.7 mg/dL (ref 1.7–2.4)

## 2018-02-15 LAB — HEPARIN LEVEL (UNFRACTIONATED): Heparin Unfractionated: 0.52 IU/mL (ref 0.30–0.70)

## 2018-02-15 MED ORDER — SODIUM CHLORIDE 0.9% FLUSH
3.0000 mL | Freq: Two times a day (BID) | INTRAVENOUS | Status: DC
  Administered 2018-02-15: 3 mL via INTRAVENOUS

## 2018-02-15 MED ORDER — HEPARIN BOLUS VIA INFUSION
4000.0000 [IU] | Freq: Once | INTRAVENOUS | Status: AC
Start: 2018-02-15 — End: 2018-02-15
  Administered 2018-02-15: 4000 [IU] via INTRAVENOUS
  Filled 2018-02-15: qty 4000

## 2018-02-15 MED ORDER — SODIUM CHLORIDE 0.9 % WEIGHT BASED INFUSION
1.0000 mL/kg/h | INTRAVENOUS | Status: DC
  Administered 2018-02-16: 1 mL/kg/h via INTRAVENOUS

## 2018-02-15 MED ORDER — ONDANSETRON HCL 4 MG/2ML IJ SOLN: 4.0000 mg | Freq: Four times a day (QID) | INTRAMUSCULAR | Status: DC | PRN

## 2018-02-15 MED ORDER — ASPIRIN EC 81 MG PO TBEC
81.0000 mg | DELAYED_RELEASE_TABLET | Freq: Every day | ORAL | Status: DC
  Administered 2018-02-17: 81 mg via ORAL
  Filled 2018-02-15: qty 1

## 2018-02-15 MED ORDER — ASPIRIN 81 MG PO CHEW: 324.0000 mg | CHEWABLE_TABLET | ORAL | Status: DC

## 2018-02-15 MED ORDER — SODIUM CHLORIDE 0.9% FLUSH: 3.0000 mL | INTRAVENOUS | Status: DC | PRN

## 2018-02-15 MED ORDER — SODIUM CHLORIDE 0.9 % IV SOLN: 250.0000 mL | INTRAVENOUS | Status: DC | PRN

## 2018-02-15 MED ORDER — ACETAMINOPHEN 325 MG PO TABS
650.0000 mg | ORAL_TABLET | ORAL | Status: DC | PRN
  Administered 2018-02-15 – 2018-02-16 (×3): 650 mg via ORAL
  Filled 2018-02-15 (×3): qty 2

## 2018-02-15 MED ORDER — ASPIRIN 300 MG RE SUPP: 300.0000 mg | RECTAL | Status: DC

## 2018-02-15 MED ORDER — NITROGLYCERIN 0.4 MG SL SUBL
0.4000 mg | SUBLINGUAL_TABLET | SUBLINGUAL | Status: DC | PRN
  Administered 2018-02-15: 0.4 mg via SUBLINGUAL
  Filled 2018-02-15: qty 1

## 2018-02-15 MED ORDER — ASPIRIN 81 MG PO CHEW
81.0000 mg | CHEWABLE_TABLET | ORAL | Status: AC
  Administered 2018-02-16: 81 mg via ORAL
  Filled 2018-02-15: qty 1

## 2018-02-15 MED ORDER — HEPARIN (PORCINE) IN NACL 100-0.45 UNIT/ML-% IJ SOLN
900.0000 [IU]/h | INTRAMUSCULAR | Status: DC
  Administered 2018-02-15: 900 [IU]/h via INTRAVENOUS
  Filled 2018-02-15 (×2): qty 250

## 2018-02-15 MED ORDER — NITROGLYCERIN IN D5W 200-5 MCG/ML-% IV SOLN
0.0000 ug/min | INTRAVENOUS | Status: DC
  Administered 2018-02-15: 5 ug/min via INTRAVENOUS
  Filled 2018-02-15: qty 250

## 2018-02-15 MED ORDER — ATORVASTATIN CALCIUM 80 MG PO TABS
80.0000 mg | ORAL_TABLET | Freq: Every day | ORAL | Status: DC
  Administered 2018-02-15 – 2018-02-16 (×2): 80 mg via ORAL
  Filled 2018-02-15 (×2): qty 1

## 2018-02-15 MED ORDER — ASPIRIN 81 MG PO CHEW
324.0000 mg | CHEWABLE_TABLET | Freq: Once | ORAL | Status: AC
Start: 2018-02-15 — End: 2018-02-15
  Administered 2018-02-15: 324 mg via ORAL
  Filled 2018-02-15: qty 4

## 2018-02-15 MED ORDER — SODIUM CHLORIDE 0.9 % WEIGHT BASED INFUSION
3.0000 mL/kg/h | INTRAVENOUS | Status: DC
  Administered 2018-02-16: 3 mL/kg/h via INTRAVENOUS

## 2018-02-15 NOTE — ED Notes (Signed)
Pt denies any further pain after nitro was administered ; pt states " I feel much better "

## 2018-02-15 NOTE — Progress Notes (Signed)
ANTICOAGULATION CONSULT NOTE - Initial Consult  Pharmacy Consult for heparin Indication: chest pain/ACS  No Known Allergies  Patient Measurements: Height: 5\' 3"  (160 cm) Weight: 200 lb (90.7 kg) IBW/kg (Calculated) : 52.4 Heparin Dosing Weight: 73.1kg  Vital Signs: Temp: 97.8 F (36.6 C) (05/20 0815) Temp Source: Oral (05/20 0815) BP: 152/83 (05/20 0815) Pulse Rate: 60 (05/20 0815)  Labs: Recent Labs    02/15/18 0834  HGB 13.7  HCT 41.5  PLT 238  CREATININE 1.23*    Estimated Creatinine Clearance: 49.4 mL/min (A) (by C-G formula based on SCr of 1.23 mg/dL (H)).   Medical History: Past Medical History:  Diagnosis Date  . Diabetes mellitus without complication (HCC)    prediabetic   . Hypertension     Medications:  Infusions:  . heparin      Assessment: 58 yof presented to the ED with CP. Troponin elevated at 0.96 and now starting IV heparin. Baseline CBC is WNL and pt is not on anticoagulation PTA.   Goal of Therapy:  Heparin level 0.3-0.7 units/ml Monitor platelets by anticoagulation protocol: Yes   Plan:  Heparin bolus 4000 units IV x 1 Heparin gtt 900 units/hr Check a 6 hr heparin level Daily heparin level and CBC  Naasir Carreira, Drake Leach 02/15/2018,9:14 AM

## 2018-02-15 NOTE — Progress Notes (Signed)
ANTICOAGULATION CONSULT NOTE  Pharmacy Consult:  Heparin Indication: chest pain/ACS  No Known Allergies  Patient Measurements: Height: 5\' 3"  (160 cm) Weight: 198 lb 12.9 oz (90.2 kg) IBW/kg (Calculated) : 52.4 Heparin Dosing Weight: 73 kg  Vital Signs: Temp: 97.8 F (36.6 C) (05/20 0815) Temp Source: Oral (05/20 0815) BP: 134/80 (05/20 1500) Pulse Rate: 65 (05/20 1500)  Labs: Recent Labs    02/15/18 0834 02/15/18 1512  HGB 13.7  --   HCT 41.5  --   PLT 238  --   HEPARINUNFRC  --  0.52  CREATININE 1.23*  --     Estimated Creatinine Clearance: 49.2 mL/min (A) (by C-G formula based on SCr of 1.23 mg/dL (H)).   Assessment: 67 yof presented to the ED with CP. Troponin elevated at 0.96 and Pharmacy consulted to manage IV heparin. Baseline CBC is WNL and pt is not on anticoagulation PTA.   Heparin level is therapeutic; no bleeding reported.  Goal of Therapy:  Heparin level 0.3-0.7 units/ml Monitor platelets by anticoagulation protocol: Yes    Plan:  Continue heparin gtt at 900 units/hr F/U AM labs   Jyoti Harju D. Laney Potash, PharmD, BCPS, BCCCP Pager:  [phone] 02/15/2018, 4:27 PM

## 2018-02-15 NOTE — ED Triage Notes (Signed)
Pt arrives via EMS with complaints of CP and neck pain that started last night, pt reports taking 1g ibuprofen with no relief. 9/10 pain

## 2018-02-15 NOTE — ED Provider Notes (Addendum)
Emergency Department Provider Note   I have reviewed the triage vital signs and the nursing notes.   HISTORY  Chief Complaint Chest Pain   HPI Erika Bates is a 67 y.o. female with PMH of DM, HTN, and elevated BMI resents to the emergency department for evaluation of chest pain radiating to the neck.  Patient describes the pain as sharp, central chest pain.  It began last night with no obvious provoking factors.  Pain is not worse with movement or touching the area.  No chest wall injury.  She denies any shortness of breath or leg swelling/pain. No history of DVT.  Patient states that she does believe she had a heart attack in the past but states it was approximately 30 years ago and she does not recall what was done for this.  He does not follow with a cardiologist currently.  She was seen several days ago for headache and states that has resolved.   Past Medical History:  Diagnosis Date  . Diabetes mellitus without complication (HCC)    prediabetic   . Hypertension     Patient Active Problem List   Diagnosis Date Noted  . Unstable angina (HCC) 02/15/2018  . History of kidney problems 07/22/2017    History reviewed. No pertinent surgical history.    Allergies Patient has no known allergies.  Family History  Problem Relation Age of Onset  . Hypertension Father     Social History Social History   Tobacco Use  . Smoking status: Never Smoker  . Smokeless tobacco: Never Used  Substance Use Topics  . Alcohol use: No  . Drug use: No    Review of Systems  Constitutional: No fever/chills Eyes: No visual changes. ENT: No sore throat. Cardiovascular: Positive chest pain. Respiratory: Denies shortness of breath. Gastrointestinal: No abdominal pain.  No nausea, no vomiting.  No diarrhea.  No constipation. Genitourinary: Negative for dysuria. Musculoskeletal: Negative for back pain. Skin: Negative for rash. Neurological: Negative for headaches, focal weakness  or numbness.  10-point ROS otherwise negative.  ____________________________________________   PHYSICAL EXAM:  VITAL SIGNS: ED Triage Vitals  Enc Vitals Group     BP 02/15/18 0815 (!) 152/83     Pulse Rate 02/15/18 0815 60     Resp 02/15/18 0815 14     Temp 02/15/18 0815 97.8 F (36.6 C)     Temp Source 02/15/18 0815 Oral     SpO2 02/15/18 0812 100 %     Pain Score 02/15/18 0815 9   Constitutional: Alert and oriented. Well appearing and in no acute distress. Eyes: Conjunctivae are normal. Head: Atraumatic. Nose: No congestion/rhinnorhea. Mouth/Throat: Mucous membranes are moist.  Neck: No stridor. Cardiovascular: Normal rate, regular rhythm. Good peripheral circulation. Grossly normal heart sounds.   Respiratory: Normal respiratory effort.  No retractions. Lungs CTAB. Gastrointestinal: Soft and nontender. No distention.  Musculoskeletal: No lower extremity tenderness nor edema. No gross deformities of extremities. Neurologic:  Normal speech and language. No gross focal neurologic deficits are appreciated.  Skin:  Skin is warm, dry and intact. No rash noted.  ____________________________________________   LABS (all labs ordered are listed, but only abnormal results are displayed)  Labs Reviewed  BASIC METABOLIC PANEL - Abnormal; Notable for the following components:      Result Value   Sodium 134 (*)    CO2 21 (*)    Glucose, Bld 183 (*)    Creatinine, Ser 1.23 (*)    GFR calc non Af Amer 45 (*)  GFR calc Af Amer 53 (*)    All other components within normal limits  HEMOGLOBIN A1C - Abnormal; Notable for the following components:   Hgb A1c MFr Bld 8.4 (*)    All other components within normal limits  I-STAT TROPONIN, ED - Abnormal; Notable for the following components:   Troponin i, poc 0.96 (*)    All other components within normal limits  MRSA PCR SCREENING  CBC WITH DIFFERENTIAL/PLATELET  D-DIMER, QUANTITATIVE (NOT AT [facility])  HEPARIN LEVEL (UNFRACTIONATED)    HIV ANTIBODY (ROUTINE TESTING)  MAGNESIUM  TROPONIN I  TROPONIN I  TROPONIN I   ____________________________________________  EKG   EKG Interpretation  Date/Time:  Monday Feb 15 2018 08:09:42 EDT Ventricular Rate:  60 PR Interval:    QRS Duration: 86 QT Interval:  437 QTC Calculation: 437 R Axis:   0 Text Interpretation:  Sinus rhythm Low voltage, precordial leads Non-specific ST depressions noted.  No STEMI.  Confirmed by Alona Bene [phone]) on 02/15/2018 8:49:14 AM       ____________________________________________  RADIOLOGY  Dg Chest 2 View  Result Date: 02/15/2018 CLINICAL DATA:  Chest and neck pain beginning last night. EXAM: CHEST - 2 VIEW COMPARISON:  Chest radiographs and CTA 08/17/2015 FINDINGS: The cardiac silhouette is within normal limits in size. The thoracic aorta is mildly tortuous. A calcified granuloma is again noted in the left lung. 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 No acute airspace consolidation, edema, pleural effusion, or pneumothorax is identified. No acute osseous abnormality is seen. IMPRESSION: No active cardiopulmonary disease. Electronically Signed   By: Sebastian Ache M.D.   On: 02/15/2018 09:14    ____________________________________________   PROCEDURES  Procedure(s) performed:   .Critical Care  Performed by: Maia Plan, MD  Authorized by: Maia Plan, MD   Critical care provider statement:    Critical care time (minutes):  35   Critical care time was exclusive of:  Separately billable procedures and treating other patients and teaching time   Critical care was necessary to treat or prevent imminent or life-threatening deterioration of the following conditions:  Circulatory failure and cardiac failure   Critical care was time spent personally by me on the following activities:  Blood draw for specimens, discussions with consultants, evaluation of patient's  response to treatment, development of treatment plan with patient or surrogate, examination of patient, obtaining history from patient or surrogate, ordering and review of laboratory studies, ordering and performing treatments and interventions, ordering and review of radiographic studies, pulse oximetry, re-evaluation of patient's condition and review of old charts   I assumed direction of critical care for this patient from another provider in my specialty: no      ____________________________________________   INITIAL IMPRESSION / ASSESSMENT AND PLAN / ED COURSE  Pertinent labs & imaging results that were available during my care of the patient were reviewed by me and considered in my medical decision making (see chart for details).  Patient presents to the emergency department for evaluation of chest pain.  The pain she describes as sharp but radiating to the neck.  The patient has several CAD risk factors.  No clear GI symptoms.  I palpate the patient's sternum and she does not experience any discomfort with this. Patient with a HEART score of 5 (2 for EKG, 1 for age, 2 for risk factors). Doubt PE but with sharp pain and patient's age will send d-dimer. Patient  will be given ASA and Nitro here in the ED.   Patient's troponin is positive. CP improved with nitro. Plan to start heparin and admit to the cardiology service. Labs and CXR reviewed otherwise with no acute findings.   Discussed patient's case with Cardiology to request admission. Patient and family (if present) updated with plan. Care transferred to Cardiology service.  I reviewed all nursing notes, vitals, pertinent old records, EKGs, labs, imaging (as available).  ____________________________________________  FINAL CLINICAL IMPRESSION(S) / ED DIAGNOSES  Final diagnoses:  NSTEMI (non-ST elevated myocardial infarction) (HCC)     MEDICATIONS GIVEN DURING THIS VISIT:  Medications  nitroGLYCERIN (NITROSTAT) SL tablet 0.4 mg  (0.4 mg Sublingual Given 02/15/18 0947)  heparin ADULT infusion 100 units/mL (25000 units/290mL sodium chloride 0.45%) (900 Units/hr Intravenous New Bag/Given 02/15/18 0948)  aspirin EC tablet 81 mg (has no administration in time range)  acetaminophen (TYLENOL) tablet 650 mg (has no administration in time range)  ondansetron (ZOFRAN) injection 4 mg (has no administration in time range)  nitroGLYCERIN 50 mg in dextrose 5 % 250 mL (0.2 mg/mL) infusion (10 mcg/min Intravenous Rate/Dose Change 02/15/18 1457)  atorvastatin (LIPITOR) tablet 80 mg (has no administration in time range)  aspirin chewable tablet 324 mg (324 mg Oral Given 02/15/18 0945)  heparin bolus via infusion 4,000 Units (4,000 Units Intravenous Bolus from Bag 02/15/18 0947)    Note:  This document was prepared using Dragon voice recognition software and may include unintentional dictation errors.  Alona Bene, MD Emergency Medicine    Nathyn Luiz, Arlyss Repress, MD 02/15/18 1542   Maia Plan, MD 02/15/18 [phone]

## 2018-02-15 NOTE — ED Notes (Signed)
Daughter Erika Bates [phone]

## 2018-02-15 NOTE — H&P (Addendum)
Cardiology Admission History and Physical:   Patient ID: Erika Bates; MRN: [id number]; DOB: [birth year]   Admission date: 02/15/2018  Primary Care Provider: Patient, No Pcp Per Primary Cardiologist: new - Dr. Allyson Sabal Primary Electrophysiologist:    Chief Complaint:  Unstable angina  Patient Profile:   Erika Bates is a 67 y.o. female with a history of DM, HTN, obesity,   History of Present Illness:   Erika Bates presented to [facility] with onset of chest pain that radiated to her anterior neck that started last evening at midnight. She took 1000 mg of ibuprofen, which did not help. She had associated shortness of breath.  The pain finally subsided when she put a wet towel on her chest. The pain returned this morning prompting her to seek medical care. The pain was rated 9/10.  She is from [place], [place]. She states that in her 30s she had chest pain and was found resting in the road and was taken to the hospital. She was later told that she had a heart attack, but can't remember the circumstances or treatment. Since that time, she has not had a recurrence of chest pain. She states that in 1990 she had a kidney problem and was transported to [place] where she had dialysis for 6 months. She took herself off of HD and returned to her home country. She states she hasn't had a problem since. She has been in the [place] for approximately 10 years, alternating spending 1 year here and 1 year in [place]. She does not see a regular medical provider. Per Epic, she has a history of DM, HTN, and obesity.    On arrival, chest pain was relieved with nitro. Initial POC troponin 0.96. EKG with ST depression in precordial leads that are new compared to prior. She complains of 5/10 chest pain.   Past Medical History:  Diagnosis Date  . Diabetes mellitus without complication (HCC)    prediabetic   . Hypertension     History reviewed. No pertinent surgical history.   Medications Prior to Admission: Prior to  Admission medications   Medication Sig Start Date End Date Taking? Authorizing Provider  ibuprofen (ADVIL,MOTRIN) 200 MG tablet Take 200 mg by mouth every 6 (six) hours as needed for moderate pain.   Yes [provider]  albuterol (PROVENTIL HFA;VENTOLIN HFA) 108 (90 BASE) MCG/ACT inhaler Inhale 1-2 puffs into the lungs every 6 (six) hours as needed for wheezing or shortness of breath. Patient not taking: Reported on 02/12/2018 08/17/15   Erika Pander, MD  albuterol (PROVENTIL) (2.5 MG/3ML) 0.083% nebulizer solution Take 3 mLs (2.5 mg total) by nebulization every 6 (six) hours as needed for wheezing or shortness of breath. Patient not taking: Reported on 02/12/2018 08/17/15   Erika Pander, MD  amoxicillin (AMOXIL) 500 MG capsule Take 1 capsule (500 mg total) by mouth 3 (three) times daily. 02/12/18   Zadie Rhine, MD     Allergies:   No Known Allergies  Social History:   Social History   Socioeconomic History  . Marital status: Significant Other    Spouse name: Not on file  . Number of children: Not on file  . Years of education: Not on file  . Highest education level: Not on file  Occupational History  . Not on file  Social Needs  . Financial resource strain: Not on file  . Food insecurity:    Worry: Not on file    Inability: Not on file  . Transportation needs:  Medical: Not on file    Non-medical: Not on file  Tobacco Use  . Smoking status: Never Smoker  . Smokeless tobacco: Never Used  Substance and Sexual Activity  . Alcohol use: No  . Drug use: No  . Sexual activity: Not on file  Lifestyle  . Physical activity:    Days per week: Not on file    Minutes per session: Not on file  . Stress: Not on file  Relationships  . Social connections:    Talks on phone: Not on file    Gets together: Not on file    Attends religious service: Not on file    Active member of club or organization: Not on file    Attends meetings of clubs or organizations:  Not on file    Relationship status: Not on file  . Intimate partner violence:    Fear of current or ex partner: Not on file    Emotionally abused: Not on file    Physically abused: Not on file    Forced sexual activity: Not on file  Other Topics Concern  . Not on file  Social History Narrative  . Not on file    Family History:   The patient's family history includes Hypertension in her father.    ROS:  Please see the history of present illness.  All other ROS reviewed and negative.     Physical Exam/Data:   Vitals:   02/15/18 1000 02/15/18 1030 02/15/18 1100 02/15/18 1130  BP: 139/90 110/84 (!) 146/95 (!) 146/81  Pulse: 66 65 63 66  Resp: 17 18 (!) 21 14  Temp:      TempSrc:      SpO2: 95% 100% 99% 99%  Weight:      Height:       No intake or output data in the 24 hours ending 02/15/18 1253 Filed Weights   02/15/18 0900  Weight: 200 lb (90.7 kg)   Body mass index is 35.43 kg/m.  General:  Well nourished, well developed, in no acute distress HEENT: normal Neck: no JVD Vascular: No carotid bruits  Cardiac:  normal S1, S2; RRR; no murmur  Lungs:  clear to auscultation bilaterally, no wheezing, rhonchi or rales  Abd: soft, nontender, no hepatomegaly  Ext: no edema Musculoskeletal:  No deformities, BUE and BLE strength normal and equal Skin: warm and dry  Neuro:  CNs 2-12 intact, no focal abnormalities noted Psych:  Normal affect    EKG:  The ECG that was done and was personally reviewed and demonstrates ST depression in precordial leads  Relevant CV Studies:  none  Laboratory Data:  Chemistry Recent Labs  Lab 02/12/18 0334 02/15/18 0834  NA 137 134*  K 3.9 4.4  CL 102 102  CO2 26 21*  GLUCOSE 157* 183*  BUN 16 12  CREATININE 1.21* 1.23*  CALCIUM 9.4 8.9  GFRNONAA 46* 45*  GFRAA 54* 53*  ANIONGAP 9 11    No results for input(s): PROT, ALBUMIN, AST, ALT, ALKPHOS, BILITOT in the last 168 hours. Hematology Recent Labs  Lab 02/12/18 0334  02/15/18 0834  WBC 8.1 7.7  RBC 4.59 4.94  HGB 13.3 13.7  HCT 39.6 41.5  MCV 86.3 84.0  MCH 29.0 27.7  MCHC 33.6 33.0  RDW 13.0 12.8  PLT 259 238   Cardiac EnzymesNo results for input(s): TROPONINI in the last 168 hours.  Recent Labs  Lab 02/15/18 0846  TROPIPOC 0.96*    BNPNo results for input(s):  BNP, PROBNP in the last 168 hours.  DDimer  Recent Labs  Lab 02/15/18 0834  DDIMER 0.35    Radiology/Studies:  Dg Chest 2 View  Result Date: 02/15/2018 CLINICAL DATA:  Chest and neck pain beginning last night. EXAM: CHEST - 2 VIEW COMPARISON:  Chest radiographs and CTA 08/17/2015 FINDINGS: The cardiac silhouette is within normal limits in size. The thoracic aorta is mildly tortuous. A calcified granuloma is again noted in the left lung. 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 No acute airspace consolidation, edema, pleural effusion, or pneumothorax is identified. No acute osseous abnormality is seen. IMPRESSION: No active cardiopulmonary disease. Electronically Signed   By: Sebastian Ache M.D.   On: 02/15/2018 09:14    Assessment and Plan:   1. Unstable angina, NSTEMI - initial POC troponin 0.96 - EKG with diffuse ST depression in precordial leads - chest pain was relieved with nitro - risk factors for ACS include HTN, obesity, and DM; she is a nonsmoker - history is difficult to gather with language barrier, working on getting an interpreter. - she is still having chest pain rated as a 5/10   2. Previous CKD requiring ?HD - sCr on admission 1.23 - creatinine in Epic: 1.05 (06/2017), 1.21 (02/12/18) - pt making good urine - will start gentle hydration   3. DM - A1c pending   4. HTN - no home medications - pressures 140s systolic   Will admit to 2H and plan for heart catheterization.  Need interpreter [place].   Severity of Illness: The appropriate patient status for this patient is INPATIENT.  Inpatient status is judged to be reasonable and necessary in order to provide the required intensity of service to ensure the patient's safety. The patient's presenting symptoms, physical exam findings, and initial radiographic and laboratory data in the context of their chronic comorbidities is felt to place them at high risk for further clinical deterioration. Furthermore, it is not anticipated that the patient will be medically stable for discharge from the hospital within 2 midnights of admission. The following factors support the patient status of inpatient.   " The patient's presenting symptoms include unstable angina. " The worrisome physical exam findings include . " The initial radiographic and laboratory data are worrisome because of NSTEMI. " The chronic co-morbidities include DM, HTN.   * I certify that at the point of admission it is my clinical judgment that the patient will require inpatient hospital care spanning beyond 2 midnights from the point of admission due to high intensity of service, high risk for further deterioration and high frequency of surveillance required.*    For questions or updates, please contact [facility] Please consult [email or web address] for contact info under Cardiology/STEMI.    Signed, Marcelino Duster, [place]  02/15/2018 12:53 PM   Agree with note by Micah Flesher PA-C  67 year old female from [place], a [address] in [place], who presents with unstable angina and positive enzymes.  She has no cardiac risk factors.  She travels back and forth every other year.  She does have family that lives here in [place].  She developed chest pain last night which persisted.  EMS was called.  She was given sublingual nitroglycerin which resulted in improvement.  She is currently almost pain-free on IV heparin.  Her vital signs are stable.  Her EKG does show some nonspecific ST and T wave changes.  Enzymes are minimally elevated.  Her exam is  benign.  Her daughter  acted as an Equities trader.  She will need diagnostic coronary angiography.  I discussed the procedure with the patient and family and they agreed to proceed.  It is scheduled for 9:00 tomorrow morning and the patient's daughter will arrive at 8:00 and serve as interpreter.   Runell Gess, M.D., FACP, [facility], [facility] [address] [place], [place]  [address]  [phone] 02/15/2018 1:47 PM

## 2018-02-15 NOTE — ED Notes (Signed)
Attempted to give report to floor nurse ,nurse unable  To obtain report at this time , per secretary, nurse will call me back

## 2018-02-16 ENCOUNTER — Inpatient Hospital Stay (HOSPITAL_COMMUNITY)
Admission: EM | Disposition: A | Discharge status: Home / Self Care | Payer: Self-pay | Source: Home / Self Care | Attending: Cardiovascular Disease

## 2018-02-16 ENCOUNTER — Encounter (HOSPITAL_COMMUNITY): Payer: Self-pay

## 2018-02-16 ENCOUNTER — Other Ambulatory Visit: Payer: Self-pay

## 2018-02-16 ENCOUNTER — Inpatient Hospital Stay (HOSPITAL_COMMUNITY): Payer: Self-pay

## 2018-02-16 DIAGNOSIS — I214 Non-ST elevation (NSTEMI) myocardial infarction: Secondary | ICD-10-CM | POA: Clinically undetermined

## 2018-02-16 DIAGNOSIS — I5030 Unspecified diastolic (congestive) heart failure: Secondary | ICD-10-CM

## 2018-02-16 DIAGNOSIS — I2510 Atherosclerotic heart disease of native coronary artery without angina pectoris: Secondary | ICD-10-CM

## 2018-02-16 HISTORY — PX: LEFT HEART CATH AND CORONARY ANGIOGRAPHY: CATH118249

## 2018-02-16 HISTORY — PX: CORONARY STENT INTERVENTION: CATH118234

## 2018-02-16 LAB — POCT ACTIVATED CLOTTING TIME
Activated Clotting Time: 285 seconds
Activated Clotting Time: 323 seconds
Activated Clotting Time: 312 seconds
Activated Clotting Time: 406 seconds

## 2018-02-16 LAB — BASIC METABOLIC PANEL
ANION GAP: 8 (ref 5–15)
BUN: 13 mg/dL (ref 6–20)
CALCIUM: 8.7 mg/dL — AB (ref 8.9–10.3)
CO2: 23 mmol/L (ref 22–32)
CREATININE: 1.12 mg/dL — AB (ref 0.44–1.00)
Chloride: 106 mmol/L (ref 101–111)
GFR calc Af Amer: 59 mL/min — ABNORMAL LOW (ref >60)
GFR, EST NON AFRICAN AMERICAN: 51 mL/min — AB (ref >60)
GLUCOSE: 171 mg/dL — AB (ref 65–99)
Potassium: 4 mmol/L (ref 3.5–5.1)
Sodium: 137 mmol/L (ref 135–145)

## 2018-02-16 LAB — LIPID PANEL
Cholesterol: 212 mg/dL — ABNORMAL HIGH (ref 0–200)
HDL: 37 mg/dL — AB (ref >40)
LDL CALC: 128 mg/dL — AB (ref 0–99)
Total CHOL/HDL Ratio: 5.7 RATIO
Triglycerides: 233 mg/dL — ABNORMAL HIGH (ref <150)
VLDL: 47 mg/dL — ABNORMAL HIGH (ref 0–40)

## 2018-02-16 LAB — CBC
HCT: 39.7 % (ref 36.0–46.0)
Hemoglobin: 13.1 g/dL (ref 12.0–15.0)
MCH: 27.9 pg (ref 26.0–34.0)
MCHC: 33 g/dL (ref 30.0–36.0)
MCV: 84.5 fL (ref 78.0–100.0)
PLATELETS: 255 10*3/uL (ref 150–400)
RBC: 4.7 MIL/uL (ref 3.87–5.11)
RDW: 13 % (ref 11.5–15.5)
WBC: 8.8 10*3/uL (ref 4.0–10.5)

## 2018-02-16 LAB — ECHOCARDIOGRAM COMPLETE
Height: 63 in
Weight: 3255.7533 oz

## 2018-02-16 LAB — GLUCOSE, CAPILLARY
GLUCOSE-CAPILLARY: 157 mg/dL — AB (ref 65–99)
GLUCOSE-CAPILLARY: 205 mg/dL — AB (ref 65–99)
Glucose-Capillary: 134 mg/dL — ABNORMAL HIGH (ref 65–99)

## 2018-02-16 LAB — HEPARIN LEVEL (UNFRACTIONATED): Heparin Unfractionated: 0.38 IU/mL (ref 0.30–0.70)

## 2018-02-16 LAB — TROPONIN I: Troponin I: 1.51 ng/mL (ref <0.03)

## 2018-02-16 LAB — HIV ANTIBODY (ROUTINE TESTING W REFLEX): HIV Screen 4th Generation wRfx: NONREACTIVE

## 2018-02-16 SURGERY — LEFT HEART CATH AND CORONARY ANGIOGRAPHY
Anesthesia: LOCAL

## 2018-02-16 MED ORDER — VERAPAMIL HCL 2.5 MG/ML IV SOLN
INTRAVENOUS | Status: AC
  Filled 2018-02-16: qty 2

## 2018-02-16 MED ORDER — SODIUM CHLORIDE 0.9% FLUSH: 3.0000 mL | INTRAVENOUS | Status: DC | PRN

## 2018-02-16 MED ORDER — HEPARIN SODIUM (PORCINE) 1000 UNIT/ML IJ SOLN
INTRAMUSCULAR | Status: AC
  Filled 2018-02-16: qty 1

## 2018-02-16 MED ORDER — LIDOCAINE HCL (PF) 1 % IJ SOLN
INTRAMUSCULAR | Status: DC | PRN
  Administered 2018-02-16: 2 mL

## 2018-02-16 MED ORDER — HEPARIN (PORCINE) IN NACL 1000-0.9 UT/500ML-% IV SOLN
INTRAVENOUS | Status: AC
  Filled 2018-02-16: qty 1000

## 2018-02-16 MED ORDER — SODIUM CHLORIDE 0.9 % IV SOLN: 250.0000 mL | INTRAVENOUS | Status: DC | PRN

## 2018-02-16 MED ORDER — VERAPAMIL HCL 2.5 MG/ML IV SOLN
INTRAVENOUS | Status: DC | PRN
  Administered 2018-02-16: 10 mL via INTRA_ARTERIAL

## 2018-02-16 MED ORDER — FENTANYL CITRATE (PF) 100 MCG/2ML IJ SOLN
INTRAMUSCULAR | Status: DC | PRN
  Administered 2018-02-16 (×2): 25 ug via INTRAVENOUS
  Administered 2018-02-16: 50 ug via INTRAVENOUS

## 2018-02-16 MED ORDER — LIDOCAINE HCL (PF) 1 % IJ SOLN
INTRAMUSCULAR | Status: AC
  Filled 2018-02-16: qty 30

## 2018-02-16 MED ORDER — HEPARIN (PORCINE) IN NACL 2-0.9 UNITS/ML
INTRAMUSCULAR | Status: AC | PRN
  Administered 2018-02-16 (×2): 500 mL

## 2018-02-16 MED ORDER — TICAGRELOR 90 MG PO TABS
ORAL_TABLET | ORAL | Status: AC
  Filled 2018-02-16: qty 2

## 2018-02-16 MED ORDER — IOHEXOL 350 MG/ML SOLN
INTRAVENOUS | Status: DC | PRN
  Administered 2018-02-16: 205 mL via INTRA_ARTERIAL

## 2018-02-16 MED ORDER — LABETALOL HCL 5 MG/ML IV SOLN: 10.0000 mg | INTRAVENOUS | Status: AC | PRN

## 2018-02-16 MED ORDER — TICAGRELOR 90 MG PO TABS
ORAL_TABLET | ORAL | Status: DC | PRN
  Administered 2018-02-16: 180 mg via ORAL

## 2018-02-16 MED ORDER — NITROGLYCERIN 1 MG/10 ML FOR IR/CATH LAB
INTRA_ARTERIAL | Status: AC
  Filled 2018-02-16: qty 10

## 2018-02-16 MED ORDER — MIDAZOLAM HCL 2 MG/2ML IJ SOLN
INTRAMUSCULAR | Status: DC | PRN
  Administered 2018-02-16 (×2): 1 mg via INTRAVENOUS

## 2018-02-16 MED ORDER — HYDRALAZINE HCL 20 MG/ML IJ SOLN: 5.0000 mg | INTRAMUSCULAR | Status: AC | PRN

## 2018-02-16 MED ORDER — HEPARIN SODIUM (PORCINE) 1000 UNIT/ML IJ SOLN
INTRAMUSCULAR | Status: DC | PRN
  Administered 2018-02-16: 4000 [IU] via INTRAVENOUS
  Administered 2018-02-16: 4500 [IU] via INTRAVENOUS
  Administered 2018-02-16: 3000 [IU] via INTRAVENOUS

## 2018-02-16 MED ORDER — GUAIFENESIN-DM 100-10 MG/5ML PO SYRP: 5.0000 mL | ORAL_SOLUTION | ORAL | Status: DC | PRN

## 2018-02-16 MED ORDER — SODIUM CHLORIDE 0.9% FLUSH
3.0000 mL | Freq: Two times a day (BID) | INTRAVENOUS | Status: DC
  Administered 2018-02-16 (×2): 3 mL via INTRAVENOUS

## 2018-02-16 MED ORDER — TICAGRELOR 90 MG PO TABS
90.0000 mg | ORAL_TABLET | Freq: Two times a day (BID) | ORAL | Status: DC
  Administered 2018-02-16 – 2018-02-17 (×2): 90 mg via ORAL
  Filled 2018-02-16 (×2): qty 1

## 2018-02-16 MED ORDER — MIDAZOLAM HCL 2 MG/2ML IJ SOLN
INTRAMUSCULAR | Status: AC
  Filled 2018-02-16: qty 2

## 2018-02-16 MED ORDER — SODIUM CHLORIDE 0.9 % IV SOLN
INTRAVENOUS | Status: AC
  Administered 2018-02-16: 12:00:00 via INTRAVENOUS

## 2018-02-16 MED ORDER — NITROGLYCERIN 1 MG/10 ML FOR IR/CATH LAB
INTRA_ARTERIAL | Status: DC | PRN
  Administered 2018-02-16 (×4): 200 ug via INTRACORONARY

## 2018-02-16 MED ORDER — FENTANYL CITRATE (PF) 100 MCG/2ML IJ SOLN
INTRAMUSCULAR | Status: AC
  Filled 2018-02-16: qty 2

## 2018-02-16 SURGICAL SUPPLY — 24 items
BALLN SAPPHIRE 2.0X12 (BALLOONS) ×2
BALLN SAPPHIRE 2.5X15 (BALLOONS) ×2
BALLN SAPPHIRE ~~LOC~~ 3.25X15 (BALLOONS) ×2 IMPLANT
BALLOON SAPPHIRE 2.0X12 (BALLOONS) ×1 IMPLANT
BALLOON SAPPHIRE 2.5X15 (BALLOONS) ×1 IMPLANT
CATH INFINITI JR4 5F (CATHETERS) ×2 IMPLANT
CATH LAUNCHER 5F JR4 (CATHETERS) ×2 IMPLANT
CATH LAUNCHER 5F RADR (CATHETERS) ×1 IMPLANT
CATH OPTITORQUE TIG 4.0 5F (CATHETERS) ×2 IMPLANT
CATH VISTA GUIDE 6FR XBLAD3.0 (CATHETERS) ×2 IMPLANT
CATHETER LAUNCHER 5F RADR (CATHETERS) ×2
DEVICE RAD COMP TR BAND LRG (VASCULAR PRODUCTS) ×2 IMPLANT
GLIDESHEATH SLEND A-KIT 6F 22G (SHEATH) ×2 IMPLANT
GUIDEWIRE INQWIRE 1.5J.035X260 (WIRE) ×1 IMPLANT
INQWIRE 1.5J .035X260CM (WIRE) ×2
KIT ENCORE 26 ADVANTAGE (KITS) ×2 IMPLANT
KIT HEART LEFT (KITS) ×2 IMPLANT
KIT HEMO VALVE WATCHDOG (MISCELLANEOUS) ×2 IMPLANT
PACK CARDIAC CATHETERIZATION (CUSTOM PROCEDURE TRAY) ×2 IMPLANT
STENT SYNERGY DES 2.25X12 (Permanent Stent) ×2 IMPLANT
STENT SYNERGY DES 2.75X24 (Permanent Stent) ×2 IMPLANT
TRANSDUCER W/STOPCOCK (MISCELLANEOUS) ×2 IMPLANT
TUBING CIL FLEX 10 FLL-RA (TUBING) ×2 IMPLANT
WIRE ASAHI PROWATER 180CM (WIRE) ×2 IMPLANT

## 2018-02-16 NOTE — Progress Notes (Signed)
  Echocardiogram 2D Echocardiogram has been performed.  Delcie Roch 02/16/2018, 3:14 PM

## 2018-02-16 NOTE — Care Management Note (Addendum)
Case Management Note  Patient Details  Name: Erika Bates MRN: [id number] Date of Birth: 1951/09/10  Subjective/Objective:   From home , s/p stent intervention, will be on brilinta, she will need 30 day coupon for brilinta, NCM sent  Match letter to patient to  assist with other medications, she will need patient assist form.  She can also utilize the [facility] for med asst also if dc Mon- Friday.  She has a follow up apt with [facility] for 6/19 at  3 pm.  Will need paper scripts at discharge.  NCM spoke with patient's daughter Tonna Corner [phone].  NCM informed Marylene Land to put in diabetes educator consult.                 Action/Plan: DC home when ready.  Expected Discharge Date:                  Expected Discharge Plan:  Home/Self Care  In-House Referral:     Discharge planning Services  CM Consult, [facility], [facility], Medication Assistance  Post Acute Care Choice:    Choice offered to:     DME Arranged:    DME Agency:     HH Arranged:    HH Agency:     Status of Service:  Completed, signed off  If discussed at [employer], dates discussed:    Additional Comments:  Leone Haven, RN 02/16/2018, 1:58 PM

## 2018-02-16 NOTE — Interval H&P Note (Signed)
History and Physical Interval Note:  02/16/2018 8:52 AM  Rossi Beechler  has presented today for surgery, with the diagnosis of NSTEMI (+ Troponin - progressive Anterolateral ST depression/TWI).   The various methods of treatment have been discussed with the patient and family (using the patient's daughter as interpreter (video interpreter not available for her dialect). After consideration of risks, benefits and other options for treatment, the patient has consented to  Procedure(s): LEFT HEART CATH AND CORONARY ANGIOGRAPHY (N/A) with possible PERCUTANEOUS CORONARY INTERVENTION as a surgical intervention .  The patient's history has been reviewed, patient examined, no change in status, stable for surgery.  I have reviewed the patient's chart and labs.  Questions were answered to the patient's satisfaction.    Cath Lab Visit (complete for each Cath Lab visit)  Clinical Evaluation Leading to the Procedure:   ACS: Yes.    Non-ACS:    Anginal Classification: CCS IV  Anti-ischemic medical therapy: No Therapy  Non-Invasive Test Results: No non-invasive testing performed  Prior CABG: No previous CABG     Bryan Lemma

## 2018-02-16 NOTE — Progress Notes (Addendum)
ANTICOAGULATION CONSULT NOTE  Pharmacy Consult:  Heparin Indication: chest pain/ACS  No Known Allergies  Patient Measurements: Height: 5\' 3"  (160 cm) Weight: 203 lb 7.8 oz (92.3 kg) IBW/kg (Calculated) : 52.4 Heparin Dosing Weight: 73 kg  Vital Signs: Temp: 97.8 F (36.6 C) (05/21 0406) Temp Source: Oral (05/21 0406) BP: 125/108 (05/21 0600) Pulse Rate: 72 (05/21 0600)  Labs: Recent Labs    02/15/18 0834 02/15/18 1512 02/15/18 1926 02/16/18 0223  HGB 13.7  --   --  13.1  HCT 41.5  --   --  39.7  PLT 238  --   --  255  HEPARINUNFRC  --  0.52  --  0.38  CREATININE 1.23*  --   --  1.12*  TROPONINI  --  1.47* 1.46* 1.51*    Estimated Creatinine Clearance: 54.8 mL/min (A) (by C-G formula based on SCr of 1.12 mg/dL (H)).   Assessment: Erika Bates presented to the ED with CP. Troponin elevated at 0.96 initially and increased to 1.51 overnight. Pharmacy consulted to manage IV heparin.   Heparin level is therapeutic at 0.38 today, on 900 units/hr. Hgb is 13.1, plt 255. Pt was not on anticoagulation PTA. No documented signs/symptoms of bleeding.  Goal of Therapy:  Heparin level 0.3-0.7 units/ml Monitor platelets by anticoagulation protocol: Yes   Plan:  Continue heparin gtt at 900 units/hr Monitor daily HL, CBC, for s/sx of bleeding Follow up after cath  Girard Cooter, PharmD Clinical Pharmacist  Pager: [phone] Phone: [phone] 02/16/2018, 7:29 AM

## 2018-02-16 NOTE — Progress Notes (Signed)
TR BAND REMOVAL  LOCATION:  right radial  DEFLATED PER PROTOCOL:  Yes.    TIME BAND OFF / DRESSING APPLIED:   1500   SITE UPON ARRIVAL:   Level 0  SITE AFTER BAND REMOVAL:  Level 0  CIRCULATION SENSATION AND MOVEMENT:  Within Normal Limits  Yes.    COMMENTS:  Hematoma proximal to insertion site has developed X 2.  Each time manual pressure applied X 10 minutes and site wrapped with coban, ice applied, and maintained elevation of arm.

## 2018-02-17 ENCOUNTER — Telehealth: Payer: Self-pay | Admitting: *Deleted

## 2018-02-17 ENCOUNTER — Telehealth: Payer: Self-pay | Admitting: Cardiology

## 2018-02-17 DIAGNOSIS — E119 Type 2 diabetes mellitus without complications: Secondary | ICD-10-CM

## 2018-02-17 DIAGNOSIS — Z794 Long term (current) use of insulin: Secondary | ICD-10-CM

## 2018-02-17 DIAGNOSIS — I10 Essential (primary) hypertension: Secondary | ICD-10-CM

## 2018-02-17 DIAGNOSIS — E785 Hyperlipidemia, unspecified: Secondary | ICD-10-CM

## 2018-02-17 LAB — GLUCOSE, CAPILLARY: GLUCOSE-CAPILLARY: 154 mg/dL — AB (ref 65–99)

## 2018-02-17 LAB — CBC
HEMATOCRIT: 39.7 % (ref 36.0–46.0)
HEMOGLOBIN: 13.4 g/dL (ref 12.0–15.0)
MCH: 28 pg (ref 26.0–34.0)
MCHC: 33.8 g/dL (ref 30.0–36.0)
MCV: 82.9 fL (ref 78.0–100.0)
Platelets: 231 10*3/uL (ref 150–400)
RBC: 4.79 MIL/uL (ref 3.87–5.11)
RDW: 12.8 % (ref 11.5–15.5)
WBC: 7.9 10*3/uL (ref 4.0–10.5)

## 2018-02-17 LAB — BASIC METABOLIC PANEL
ANION GAP: 9 (ref 5–15)
BUN: 17 mg/dL (ref 6–20)
CALCIUM: 8.3 mg/dL — AB (ref 8.9–10.3)
CO2: 19 mmol/L — ABNORMAL LOW (ref 22–32)
Chloride: 108 mmol/L (ref 101–111)
Creatinine, Ser: 1.19 mg/dL — ABNORMAL HIGH (ref 0.44–1.00)
GFR, EST AFRICAN AMERICAN: 55 mL/min — AB (ref >60)
GFR, EST NON AFRICAN AMERICAN: 47 mL/min — AB (ref >60)
GLUCOSE: 151 mg/dL — AB (ref 65–99)
Potassium: 4.9 mmol/L (ref 3.5–5.1)
Sodium: 136 mmol/L (ref 135–145)

## 2018-02-17 MED ORDER — GLIMEPIRIDE 1 MG PO TABS: 1.0000 mg | ORAL_TABLET | Freq: Every day | ORAL | 0 refills | Status: DC

## 2018-02-17 MED ORDER — CARVEDILOL 3.125 MG PO TABS: 3.1250 mg | ORAL_TABLET | Freq: Two times a day (BID) | ORAL | 11 refills | Status: DC

## 2018-02-17 MED ORDER — ATORVASTATIN CALCIUM 80 MG PO TABS: 80.0000 mg | ORAL_TABLET | Freq: Every day | ORAL | 11 refills | Status: DC

## 2018-02-17 MED ORDER — NITROGLYCERIN 0.4 MG SL SUBL: 0.4000 mg | SUBLINGUAL_TABLET | SUBLINGUAL | 1 refills | Status: DC | PRN

## 2018-02-17 MED ORDER — LIVING WELL WITH DIABETES BOOK
Freq: Once | Status: AC
  Administered 2018-02-17: 11:00:00
  Filled 2018-02-17: qty 1

## 2018-02-17 MED ORDER — TICAGRELOR 90 MG PO TABS: 90.0000 mg | ORAL_TABLET | Freq: Two times a day (BID) | ORAL | 0 refills | Status: DC

## 2018-02-17 MED ORDER — ANGIOPLASTY BOOK
Freq: Once | Status: DC
  Filled 2018-02-17 (×2): qty 1

## 2018-02-17 MED ORDER — GLIMEPIRIDE 1 MG PO TABS: 1.0000 mg | ORAL_TABLET | Freq: Every day | ORAL | Status: DC

## 2018-02-17 MED ORDER — TICAGRELOR 90 MG PO TABS: 90.0000 mg | ORAL_TABLET | Freq: Two times a day (BID) | ORAL | 3 refills | Status: DC

## 2018-02-17 MED ORDER — ASPIRIN 81 MG PO TBEC: 81.0000 mg | DELAYED_RELEASE_TABLET | Freq: Every day | ORAL | 11 refills | Status: DC

## 2018-02-17 MED ORDER — CARVEDILOL 3.125 MG PO TABS
3.1250 mg | ORAL_TABLET | Freq: Two times a day (BID) | ORAL | Status: DC
  Administered 2018-02-17: 3.125 mg via ORAL
  Filled 2018-02-17: qty 1

## 2018-02-17 MED FILL — Heparin Sod (Porcine)-NaCl IV Soln 1000 Unit/500ML-0.9%: INTRAVENOUS | Qty: 500 | Status: AC

## 2018-02-17 MED FILL — Heparin Sod (Porcine)-NaCl IV Soln 1000 Unit/500ML-0.9%: INTRAVENOUS | Qty: 1000 | Status: AC

## 2018-02-17 NOTE — Progress Notes (Signed)
Inpatient Diabetes Program Recommendations  AACE/ADA: New Consensus Statement on Inpatient Glycemic Control (2015)  Target Ranges:  Prepandial:   less than 140 mg/dL      Peak postprandial:   less than 180 mg/dL (1-2 hours)      Critically ill patients:  140 - 180 mg/dL   Results for SHENISE, SUDDUTH (MRN [id number]) as of 02/17/2018 11:12  Ref. Range 02/16/2018 11:52 02/16/2018 17:23 02/16/2018 21:25 02/17/2018 06:12  Glucose-Capillary Latest Ref Range: 65 - 99 mg/dL 142 (H) 395 (H) 320 (H) 154 (H)  Results for ARLINE, SZETO (MRN [id number]) as of 02/17/2018 11:12  Ref. Range 02/15/2018 08:34  Hemoglobin A1C Latest Ref Range: 4.8 - 5.6 % 8.4 (H)   Review of Glycemic Control  Diabetes history:NO Outpatient Diabetes medications: NA Current orders for Inpatient glycemic control: None  Inpatient Diabetes Program Recommendations: Oral Agents: Noted Dr. Allyson Sabal noted consider using Jardiance for DM control. However, patient does not have any insurance and the cheapest cash price is $370. Therefore, would recommend prescribing a more affordable DM medication and have patient follow up with the clinic. Please consider discharging on Amaryl 1 mg daily. HgbA1C: A1C 8.4% on 02/15/18 indicating an average glucose of 194 mg/dl over the past 2-3 months.  NOTE: Spoke with patient and her daughter about new diabetes diagnosis. Patient speaks English well and is able to understand most of the conversation and her daughter translated when needed. Patient reports that she has never been told she had DM or pre-DM.  Discussed A1C results (8.4% on 02/15/18) and explained what an A1C is and informed patient that her current A1C indicates an average glucose of 194 mg/dl over the past 2-3 months. Discussed basic pathophysiology of DM Type 2, basic home care, importance of checking CBGs and maintaining good CBG control to prevent long-term and short-term complications. Reviewed glucose and A1C goals.  Reviewed signs and  symptoms of hyperglycemia and hypoglycemia along with treatment for both. Discussed impact of nutrition, exercise, stress, sickness, and medications on diabetes control. Reviewed Living Well with diabetes booklet and encouraged patient to read through entire book. Patient does not have any insurance or PCP but has been set up with an appointment for the [facility] for follow up. Encouraged patient to go to [facility] to get the Reli-On Prime glucometer for $9 and a box of 50 Reli-On test strips for $9 for glucose montioring. Discussed lifestyle modifications (diet and exercise) and patient has been eating a high carb diet so expect dietary changes with make a significant improvement in glycemic control. Explained to patient it would be recommended, to start patient on Amaryl 1 mg daily. Discussed how Amaryl works and discussed risk of hypoglycemia with Amaryl as well. Informed patient that Amaryl is a $4 medication at [facility].  Patient's daughter states that she will plan to go to [facility] today get glucometer and testing supplies. Asked patient to check her glucose at least once a day and any other time she had symptoms of hyperglycemia or hypoglycemia. Asked patient to keep a log book of glucose readings and to take the log with her to follow up appointments at the clinic.   Patient and her daughter verbalized understanding of information discussed and they state that they has no further questions at this time related to diabetes.   RNs to provide ongoing basic DM education at bedside with this patient and engage patient to actively check blood glucose. Discussed recommendations with Bettina Gavia, PA.   Thanks, Orlando Penner, RN,  MSN, CDE Diabetes Coordinator Inpatient Diabetes Program [phone] (Team Pager from 8am to 5pm)

## 2018-02-17 NOTE — Telephone Encounter (Signed)
Case manager Gavin Pound contacted the office to speak with a provider in regards to prescribing and monitoring Brilinta which the patient would be started on after a stent was put in place. Patient has an appointment with [facility] for 03/17/18 due to [facility] not having any sooner openings. MA will route need to lead MD for advice.

## 2018-02-17 NOTE — Telephone Encounter (Signed)
TOC Patient- Please call Patient- Pt Has an appointment with Corine Shelter on 02-26-18

## 2018-02-17 NOTE — Telephone Encounter (Signed)
She was discharged today with a prescription for Brilinta and has an upcoming appointment with cardiology on 02/26/2018 where she can receive refills. Once she has established with the clinic on 03/17/2018 we can write whatever additional refills are requested.

## 2018-02-17 NOTE — Telephone Encounter (Signed)
Pt currently admitted.

## 2018-02-17 NOTE — Plan of Care (Signed)
  Problem: Education: Goal: Knowledge of General Education information will improve Outcome: Progressing   Problem: Health Behavior/Discharge Planning: Goal: Ability to manage health-related needs will improve Outcome: Progressing   Problem: Clinical Measurements: Goal: Ability to maintain clinical measurements within normal limits will improve Outcome: Progressing Goal: Will remain free from infection Outcome: Progressing Goal: Diagnostic test results will improve Outcome: Progressing Goal: Respiratory complications will improve Outcome: Progressing Goal: Cardiovascular complication will be avoided Outcome: Progressing   Problem: Pain Managment: Goal: General experience of comfort will improve Outcome: Progressing   Problem: Education: Goal: Understanding of CV disease, CV risk reduction, and recovery process will improve Outcome: Progressing   Problem: Cardiovascular: Goal: Ability to achieve and maintain adequate cardiovascular perfusion will improve Outcome: Progressing Goal: Vascular access site(s) Level 0-1 will be maintained Outcome: Progressing   Problem: Health Behavior/Discharge Planning: Goal: Ability to safely manage health-related needs after discharge will improve Outcome: Progressing

## 2018-02-17 NOTE — Progress Notes (Addendum)
CARDIAC REHAB PHASE I   PRE:  Rate/Rhythm: 81 SR    BP: sitting 147/84    SaO2:   MODE:  Ambulation: 100 ft   POST:  Rate/Rhythm: 93 SR    BP: sitting 160/75     SaO2:   Pt ambulated and was steady with slow pace however felt dizzy and returned to room.  She denied CP. BP stable, elevated. Pts daughter is on the way to help with education/translation. Apparently her language translation is not available through any of [facility]'s formats so I will not have daughter sign responsibility waiver. Will return. [phone]  Harriet Masson CES, ACSM 02/17/2018 9:26 AM   Ed completed with daughter and pt. Good reception. Will refer to [facility] in [place] however she will probably do the Maintenance program due to lack of insurance. DM educator came to discuss DM more. They understand the importance of Brilinta. [phone]

## 2018-02-17 NOTE — Progress Notes (Addendum)
Progress Note  Patient Name: Samyia Motter Date of Encounter: 02/17/2018  Primary Cardiologist: Nanetta Batty, MD   Subjective   Pt is chest pain free. I discussed medications, limitations, and restrictions with pt's daughter.  Inpatient Medications    Scheduled Meds: . angioplasty book   Does not apply Once  . aspirin EC  81 mg Oral Daily  . atorvastatin  80 mg Oral q1800  . sodium chloride flush  3 mL Intravenous Q12H  . ticagrelor  90 mg Oral BID   Continuous Infusions: . sodium chloride    . nitroGLYCERIN Stopped (02/16/18 1054)   PRN Meds: sodium chloride, acetaminophen, guaiFENesin-dextromethorphan, nitroGLYCERIN, ondansetron (ZOFRAN) IV, sodium chloride flush   Vital Signs    Vitals:   02/16/18 1537 02/16/18 1915 02/17/18 0210 02/17/18 0641  BP: (!) 153/70 (!) 150/66 130/72 136/75  Pulse: 70 87 86   Resp: 19 (!) 25 15 13   Temp: 98 F (36.7 C) 98.2 F (36.8 C) 98.4 F (36.9 C) 97.8 F (36.6 C)  TempSrc: Oral Oral Oral Oral  SpO2: 100% 97% 98% 100%  Weight:   205 lb 0.4 oz (93 kg)   Height:        Intake/Output Summary (Last 24 hours) at 02/17/2018 0735 Last data filed at 02/16/2018 2000 Gross per 24 hour  Intake 1100 ml  Output 500 ml  Net 600 ml   Filed Weights   02/15/18 1452 02/16/18 0500 02/17/18 0210  Weight: 198 lb 12.9 oz (90.2 kg) 203 lb 7.8 oz (92.3 kg) 205 lb 0.4 oz (93 kg)    Telemetry    sinus - Personally Reviewed  ECG    No new tracings - Personally Reviewed  Physical Exam   GEN: No acute distress.   Neck: No JVD Cardiac: RRR, no murmurs, rubs, or gallops.  Respiratory: Clear to auscultation bilaterally. GI: Soft, nontender, non-distended  MS: No edema; No deformity. Neuro:  Nonfocal  Psych: Normal affect   Labs    Chemistry Recent Labs  Lab 02/15/18 0834 02/16/18 0223 02/17/18 0157  NA 134* 137 136  K 4.4 4.0 4.9  CL 102 106 108  CO2 21* 23 19*  GLUCOSE 183* 171* 151*  BUN 12 13 17   CREATININE 1.23*  1.12* 1.19*  CALCIUM 8.9 8.7* 8.3*  GFRNONAA 45* 51* 47*  GFRAA 53* 59* 55*  ANIONGAP 11 8 9      Hematology Recent Labs  Lab 02/15/18 0834 02/16/18 0223 02/17/18 0157  WBC 7.7 8.8 7.9  RBC 4.94 4.70 4.79  HGB 13.7 13.1 13.4  HCT 41.5 39.7 39.7  MCV 84.0 84.5 82.9  MCH 27.7 27.9 28.0  MCHC 33.0 33.0 33.8  RDW 12.8 13.0 12.8  PLT 238 255 231    Cardiac Enzymes Recent Labs  Lab 02/15/18 1512 02/15/18 1926 02/16/18 0223  TROPONINI 1.47* 1.46* 1.51*    Recent Labs  Lab 02/15/18 0846  TROPIPOC 0.96*     BNPNo results for input(s): BNP, PROBNP in the last 168 hours.   DDimer  Recent Labs  Lab 02/15/18 0834  DDIMER 0.35     Radiology    Dg Chest 2 View  Result Date: 02/15/2018 CLINICAL DATA:  Chest and neck pain beginning last night. EXAM: CHEST - 2 VIEW COMPARISON:  Chest radiographs and CTA 08/17/2015 FINDINGS: The cardiac silhouette is within normal limits in size. The thoracic aorta is mildly tortuous. A calcified granuloma is again noted in the left lung. Small tubular density projecting in the left  upper lobe likely corresponds to the bronchocele on prior CT. No acute airspace consolidation, edema, pleural effusion, or pneumothorax is identified. No acute osseous abnormality is seen. IMPRESSION: No active cardiopulmonary disease. Electronically Signed   By: Sebastian Ache M.D.   On: 02/15/2018 09:14    Cardiac Studies   Heart cath 02/16/18:  CULPRIT LESION: Ost Ramus to Ramus lesion is 95% stenosed.  A drug-eluting stent was successfully placed using a STENT SYNERGY DES 2.25X12. - post-dilated to 2.4 mm  Post intervention, there is a 0% residual stenosis.  LESION #2: Prox LAD lesion is 75% stenosed.  A drug-eluting stent was successfully placed using a STENT SYNERGY DES 2.75X24. - post-dilated to 3.1 mm  Post intervention, there is a 0% residual stenosis.  _________________________________________________________________  Suezanne Jacquet RCA to Prox RCA lesion  is 55% stenosed.  Prox Cx to Mid Cx lesion is 75% stenosed - followed by Suezanne Jacquet 2nd Mrg CTO 100% stenosed - fills via RPL-OM collaterals  The left ventricular systolic function is "low" normal. The left ventricular ejection fraction is 50-55% by visual estimate. --Mid to distal anterolateral hypokinesis  LV end diastolic pressure is normal.   Successful DES PCI of the proximal Ramus Intermedius and proximal LAD. Medical management of mid circumflex CTO as well as ostial RCA 50 to 60%.. Anterolateral hypokinesis on LV gram but otherwise preserved EF.   Plan:   Transfer to 6 Central post procedure unit for TR band removal and overnight monitoring.  Nitroglycerin was weaned off  Dual antiplatelet therapy for minimum 1 year  Continue aggressive risk factor modification with blood pressure, glycemic and lipid management   Echo 02/16/18: Study Conclusions - Left ventricle: The cavity size was normal. Wall thickness was   normal. Systolic function was normal. The estimated ejection   fraction was in the range of 55% to 60%. Wall motion was normal;   there were no regional wall motion abnormalities. Doppler   parameters are consistent with abnormal left ventricular   relaxation (grade 1 diastolic dysfunction).   Patient Profile     67 y.o. female is a 67 yo female who with a history of obesity. She takes no medications at home and does not see a a PCP regularly. She presented with NSTEMI and underwent heart cath 02/16/18.  Assessment & Plan    1. NSTEMI - pt underwent heart catheterization yesterday with placement of DES to proximal RI and proximal LAD. Recommended medical management of mid Cx CTO and ostial RCA that is 50-60% occluded.  - plan for DAPT with ASA and brilinta for 12 months - echo with preserved EF   2. HLD - 02/16/2018: Cholesterol 212; HDL 37; LDL Cholesterol 128; Triglycerides 233; VLDL 47 - started high dose lipitor for LDL goal of less than 70 - check lipids  and LFTs in 6 weeks   3. HTN - pressures have been labile, HR 60-80s - will add low dose coreg today - titrate as tolerated in the OP setting   4. DM - A1c this admission was 8.4 - will defer to primary to start PO medication, may be a good candidate for jardiance - needs to follow up with a PCP - consulted case management to give information about DM and information to establish with a PCP    For questions or updates, please contact [facility] Please consult [email or web address] for contact info under Cardiology/STEMI.      Signed, Roe Rutherford [place], PA  02/17/2018, 7:35 AM     Agree  with note by Micah Flesher PA-C  Postop day 1 LAD and ramus branch intervention by Dr. Herbie Baltimore via the right radial approach.  Her puncture site looks good.  She is asymptomatic.  She is on optimal medical therapy including DAPT.  We will get the diabetes teaching nurse to see her this morning.  She is signed up for a outpatient medicine clinic appointment.  We will arrange for her currently seen back in our office in the next 2 to 3 weeks.  Runell Gess, M.D., FACP, [facility], [facility] [address] [place], [place]  [address]  [phone] 02/17/2018 10:12 AM

## 2018-02-17 NOTE — Discharge Summary (Addendum)
Discharge Summary    Patient ID: Karima Carrell,  MRN: [id number], DOB/AGE: December 26, 1950 67 y.o.  Admit date: 02/15/2018 Discharge date: 02/17/2018  Primary Care Provider: Patient, No Pcp Per Primary Cardiologist: Nanetta Batty, MD  Discharge Diagnoses    Principal Problem:   Non-ST elevation (NSTEMI) myocardial infarction [facility]) Active Problems:   History of kidney problems   Unstable angina (HCC)   Type 2 diabetes mellitus without complication, without long-term current use of insulin (HCC)   Hyperlipidemia LDL goal <70   Hypertension, essential   Allergies No Known Allergies  Diagnostic Studies/Procedures    Heart cath 02-23-2018:  CULPRIT LESION: Ost Ramus to Ramus lesion is 95% stenosed.  A drug-eluting stent was successfully placed using a STENT SYNERGY DES 2.25X12. - post-dilated to 2.4 mm  Post intervention, there is a 0% residual stenosis.  LESION #2: Prox LAD lesion is 75% stenosed.  A drug-eluting stent was successfully placed using a STENT SYNERGY DES 2.75X24. - post-dilated to 3.1 mm  Post intervention, there is a 0% residual stenosis.  _________________________________________________________________  Suezanne Jacquet RCA to Prox RCA lesion is 55% stenosed.  Prox Cx to Mid Cx lesion is 75% stenosed - followed by Suezanne Jacquet 2nd Mrg CTO 100% stenosed - fills via RPL-OM collaterals  The left ventricular systolic function is "low" normal. The left ventricular ejection fraction is 50-55% by visual estimate. --Mid to distal anterolateral hypokinesis  LV end diastolic pressure is normal.  Successful DES PCI of the proximal Ramus Intermedius and proximal LAD. Medical management of mid circumflex CTO as well as ostial RCA 50 to 60%.. Anterolateral hypokinesis on LV gram but otherwise preserved EF.   Plan:   Transfer to 6 Central post procedure unit for TR band removal and overnight monitoring.  Nitroglycerin was weaned off  Dual antiplatelet therapy for minimum 1  year  Continue aggressive risk factor modification with blood pressure, glycemic and lipid management   Echo 02-23-2018: Study Conclusions - Left ventricle: The cavity size was normal. Wall thickness was normal. Systolic function was normal. The estimated ejection fraction was in the range of 55% to 60%. Wall motion was normal; there were no regional wall motion abnormalities. Doppler parameters are consistent with abnormal left ventricular relaxation (grade 1 diastolic dysfunction).   History of Present Illness     Ms. Dexheimer presented to [facility] with onset of chest pain that radiated to her anterior neck that started last evening at midnight. She took 1000 mg of ibuprofen, which did not help. She had associated shortness of breath.  The pain finally subsided when she put a wet towel on her chest. The pain returned this morning prompting her to seek medical care. The pain was rated 9/10.  She is from [place], [place]. She states that in her 30s she had chest pain and was found resting in the road and was taken to the hospital. She was later told that she had a heart attack, but can't remember the circumstances or treatment. Since that time, she has not had a recurrence of chest pain. She states that in 1990 she had a kidney problem and was transported to [place] where she had dialysis for 6 months. She took herself off of HD and returned to her home country. She states she hasn't had a problem since. She has been in the [place] for approximately 10 years, alternating spending 1 year here and 1 year in [place]. She does not see a regular medical provider. Per Epic, she has a history  of DM, HTN, and obesity.    On arrival, chest pain was relieved with nitro. Initial POC troponin 0.96. EKG with ST depression in precordial leads that are new compared to prior. She complains of 5/10 chest pain.    Hospital Course     Consultants: none  NSTEMI PT was taken to the cath lab 02/16/18 for heart  cath with successful placement of DES to proximal RI and proximal LAD. Recommended medical management of mid Cx CTO and ostial RCA that is 50-60% occluded.  Plan for DAPT with ASA and brilinta for 12 months. Echo with preserved EF.  She tolerated the procedure well. Right radial access site is C/D/I with good radial pulse. She denies chest pain since the procedure.   HTN Pressures have been labile. Started low dose of coreg this admission, titrate up as tolerated outpatient.   HLD with goal less than 70 02/16/2018: Cholesterol 212; HDL 37; LDL Cholesterol 128; Triglycerides 233; VLDL 47 Started high dose lipitor for LDL goal of less than 70.  Check lipids and LFTs in 6 weeks.  DM Diagnosed this admission with and A1c of 8.4%. Will defer to PCP for management. DM coordinator was consulted and recommended starting amaryl 1 mg daily. Script written without refills.  Prior ?CKD Records unavailable for her time in [place] undergoing HD for 6 months in the 1990s. sCr on arrival was 1.21, discharge creatinine was 1.19. Recommend repeat BMP at OP follow up.  I spoke with the patient's daughter and explains medications and restrictions. Pt is chest pain free and has ambulated in the halls on room air. She is ready for discharge.   _____________  Discharge Vitals Blood pressure (!) 160/75, pulse 85, temperature 97.8 F (36.6 C), temperature source Oral, resp. rate 12, height 5\' 3"  (1.6 m), weight 205 lb 0.4 oz (93 kg), SpO2 100 %.  Filed Weights   02/15/18 1452 02/16/18 0500 02/17/18 0210  Weight: 198 lb 12.9 oz (90.2 kg) 203 lb 7.8 oz (92.3 kg) 205 lb 0.4 oz (93 kg)    Labs & Radiologic Studies    CBC Recent Labs    02/15/18 0834 02/16/18 0223 02/17/18 0157  WBC 7.7 8.8 7.9  NEUTROABS 3.2  --   --   HGB 13.7 13.1 13.4  HCT 41.5 39.7 39.7  MCV 84.0 84.5 82.9  PLT 238 255 231   Basic Metabolic Panel Recent Labs    77/03/40 1512 02/16/18 0223 02/17/18 0157  NA  --  137 136  K  --  4.0  4.9  CL  --  106 108  CO2  --  23 19*  GLUCOSE  --  171* 151*  BUN  --  13 17  CREATININE  --  1.12* 1.19*  CALCIUM  --  8.7* 8.3*  MG 1.7  --   --    Liver Function Tests No results for input(s): AST, ALT, ALKPHOS, BILITOT, PROT, ALBUMIN in the last 72 hours. No results for input(s): LIPASE, AMYLASE in the last 72 hours. Cardiac Enzymes Recent Labs    02/15/18 1512 02/15/18 1926 02/16/18 0223  TROPONINI 1.47* 1.46* 1.51*   BNP Invalid input(s): POCBNP D-Dimer Recent Labs    02/15/18 0834  DDIMER 0.35   Hemoglobin A1C Recent Labs    02/15/18 0834  HGBA1C 8.4*   Fasting Lipid Panel Recent Labs    02/16/18 0223  CHOL 212*  HDL 37*  LDLCALC 128*  TRIG 233*  CHOLHDL 5.7   Thyroid Function Tests  No results for input(s): TSH, T4TOTAL, T3FREE, THYROIDAB in the last 72 hours.  Invalid input(s): FREET3 _____________  Dg Chest 2 View  Result Date: 02/15/2018 CLINICAL DATA:  Chest and neck pain beginning last night. EXAM: CHEST - 2 VIEW COMPARISON:  Chest radiographs and CTA 08/17/2015 FINDINGS: The cardiac silhouette is within normal limits in size. The thoracic aorta is mildly tortuous. A calcified granuloma is again noted in the left lung. 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 No acute airspace consolidation, edema, pleural effusion, or pneumothorax is identified. No acute osseous abnormality is seen. IMPRESSION: No active cardiopulmonary disease. Electronically Signed   By: Sebastian Ache M.D.   On: 02/15/2018 09:14   Ct Head Wo Contrast  Result Date: 02/12/2018 CLINICAL DATA:  67 y/o F; 1 day of headache starting in the posterior head and moving to the frontal area. EXAM: CT HEAD WITHOUT CONTRAST TECHNIQUE: Contiguous axial images were obtained from the base of the skull through the vertex without intravenous contrast. COMPARISON:  None. FINDINGS: Brain: No evidence of acute infarction, hemorrhage,  hydrocephalus, extra-axial collection or mass lesion/mass effect. Vascular: No hyperdense vessel or unexpected calcification. Skull: Normal. Negative for fracture or focal lesion. Sinuses/Orbits: Opacification of the left maxillary sinus with chronic inflammatory changes of the sinus walls. Normal aeration of the mastoid air cells. Orbits are unremarkable. Other: None. IMPRESSION: 1. No acute intracranial abnormality.  Unremarkable CT of the brain. 2. Left maxillary sinus opacification and chronic inflammatory changes. Electronically Signed   By: Mitzi Hansen M.D.   On: 02/12/2018 04:11   Disposition   Pt is being discharged home today in good condition.  Follow-up Plans & Appointments    Follow-up Information    [facility] Follow up on 03/17/2018.   Why:  3 pm for hospital follow up Contact information: [place] [phone]       [facility] Follow up.   Why:  you can utilize the pharmacy Contact information: [address] [facility] [address] [phone]       Abelino Derrick, PA-C Follow up on 02/26/2018.   Specialties:  Cardiology, Radiology Why:  8:30 for TCM follow up appt Contact information: [address] [place] [address] [phone]          Discharge Instructions    Amb Referral to Cardiac Rehabilitation   Complete by:  As directed    Diagnosis:   Coronary Stents NSTEMI PTCA     Diet - low sodium heart healthy   Complete by:  As directed    Discharge instructions   Complete by:  As directed    No driving for 1 week. No lifting over 5 lbs for 1 week. No sexual activity for 1 week. You may return to work in 1 week. Keep procedure site clean & dry. If you notice increased pain, swelling, bleeding or pus, call/return!  You may shower, but no soaking baths/hot tubs/pools for 1 week.   Increase  activity slowly   Complete by:  As directed       Discharge Medications   Allergies as of 02/17/2018   No Known Allergies     Medication List    STOP taking these medications   amoxicillin 500 MG capsule Commonly known as:  AMOXIL   ibuprofen 200 MG tablet Commonly known as:  ADVIL,MOTRIN  TAKE these medications   albuterol 108 (90 Base) MCG/ACT inhaler Commonly known as:  PROVENTIL HFA;VENTOLIN HFA Inhale 1-2 puffs into the lungs every 6 (six) hours as needed for wheezing or shortness of breath.   albuterol (2.5 MG/3ML) 0.083% nebulizer solution Commonly known as:  PROVENTIL Take 3 mLs (2.5 mg total) by nebulization every 6 (six) hours as needed for wheezing or shortness of breath.   aspirin 81 MG EC tablet Take 1 tablet (81 mg total) by mouth daily.   atorvastatin 80 MG tablet Commonly known as:  LIPITOR Take 1 tablet (80 mg total) by mouth daily at 6 PM.   carvedilol 3.125 MG tablet Commonly known as:  COREG Take 1 tablet (3.125 mg total) by mouth 2 (two) times daily with a meal.   glimepiride 1 MG tablet Commonly known as:  AMARYL Take 1 tablet (1 mg total) by mouth daily with breakfast. Start taking on:  02/18/2018   nitroGLYCERIN 0.4 MG SL tablet Commonly known as:  NITROSTAT Place 1 tablet (0.4 mg total) under the tongue every 5 (five) minutes as needed for chest pain.   ticagrelor 90 MG Tabs tablet Commonly known as:  BRILINTA Take 1 tablet (90 mg total) by mouth 2 (two) times daily.        Aspirin prescribed at discharge?  Yes High Intensity Statin Prescribed? (Lipitor 40-80mg  or Crestor 20-40mg ): Yes Beta Blocker Prescribed? Yes For EF <40%, was ACEI/ARB Prescribed? No: normal ef ADP Receptor Inhibitor Prescribed? (i.e. Plavix etc.-Includes Medically Managed Patients): Yes For EF <40%, Aldosterone Inhibitor Prescribed? No: normal EF Was EF assessed during THIS hospitalization? Yes Was Cardiac Rehab II ordered? (Included Medically managed  Patients): Yes   Outstanding Labs/Studies   Lipids and LFTs in 6 weeks, BMP at follow up  Needs DM management  Duration of Discharge Encounter   Greater than 30 minutes including physician time.  Signed, Roe Rutherford [address] [place], [place] 02/17/2018, 11:20 AM  Agree with note by Micah Flesher PA-C  Stable for discharge this morning status post LAD and ramus branch intervention by Dr. Herbie Baltimore.  Her LV function was normal.  She is on optimal medical medical therapy.  She had no recurrent symptoms.  Her exam is benign.  We will arrange outpatient follow-up.  Runell Gess, M.D., FACP, [facility], [facility] [address] [place], [place]  [address]  [phone] 02/17/2018 12:49 PM

## 2018-02-18 NOTE — Telephone Encounter (Signed)
Left message for pt to call.

## 2018-02-18 NOTE — Telephone Encounter (Signed)
Patient contacted regarding discharge from [facility] on 02/17/18  Patient understands to follow up with provider kilroy on 02/26/18 at 8:30 am at [facility] Patient understands discharge instructions? yes Patient understands medications and regiment? yes Patient understands to bring all medications to this visit? yes

## 2018-02-26 ENCOUNTER — Encounter: Payer: Self-pay | Admitting: Cardiology

## 2018-02-26 ENCOUNTER — Ambulatory Visit (INDEPENDENT_AMBULATORY_CARE_PROVIDER_SITE_OTHER): Payer: Self-pay | Admitting: Cardiology

## 2018-02-26 VITALS — BP 122/70 | HR 64 | Ht 63.0 in | Wt 197.4 lb

## 2018-02-26 DIAGNOSIS — E119 Type 2 diabetes mellitus without complications: Secondary | ICD-10-CM

## 2018-02-26 DIAGNOSIS — I2510 Atherosclerotic heart disease of native coronary artery without angina pectoris: Secondary | ICD-10-CM | POA: Insufficient documentation

## 2018-02-26 DIAGNOSIS — I214 Non-ST elevation (NSTEMI) myocardial infarction: Secondary | ICD-10-CM

## 2018-02-26 DIAGNOSIS — Z9861 Coronary angioplasty status: Secondary | ICD-10-CM

## 2018-02-26 DIAGNOSIS — E785 Hyperlipidemia, unspecified: Secondary | ICD-10-CM

## 2018-02-26 NOTE — Assessment & Plan Note (Signed)
LAD and RI PCI with DES 02/15/18. Residual occluded dCFX with R-L collaterals. Normal LVF

## 2018-02-26 NOTE — Progress Notes (Signed)
02/26/2018 Erika Bates   10-10-1950  [id number]  Primary Physician Patient, No Pcp Per Primary Cardiologist: Dr Allyson Sabal  HPI:  67 y/o female from [place], admitted 02/15/18 with a NSTEMI. The pt was on no medications prior to admission. Cath revealed mLAD, RI, and occluded dCFX with R-L collaterals. Her LVF was normal, Troponin peak 1.51. She underwent PCI to the RI and LAD with DES. She is in the office today for follow up. She is accompanied by her daughter who acted as an Equities trader. The pt has been doing well, no chest pain, tolerating her medications. Her daughter says they have "totalyy changed her diet".    Current Outpatient Medications  Medication Sig Dispense Refill  . aspirin EC 81 MG EC tablet Take 1 tablet (81 mg total) by mouth daily. 30 tablet 11  . atorvastatin (LIPITOR) 80 MG tablet Take 1 tablet (80 mg total) by mouth daily at 6 PM. 30 tablet 11  . carvedilol (COREG) 3.125 MG tablet Take 1 tablet (3.125 mg total) by mouth 2 (two) times daily with a meal. 60 tablet 11  . glimepiride (AMARYL) 1 MG tablet Take 1 tablet (1 mg total) by mouth daily with breakfast. 30 tablet 0  . nitroGLYCERIN (NITROSTAT) 0.4 MG SL tablet Place 1 tablet (0.4 mg total) under the tongue every 5 (five) minutes as needed for chest pain. 25 tablet 1  . ticagrelor (BRILINTA) 90 MG TABS tablet Take 1 tablet (90 mg total) by mouth 2 (two) times daily. 180 tablet 3  . albuterol (PROVENTIL HFA;VENTOLIN HFA) 108 (90 BASE) MCG/ACT inhaler Inhale 1-2 puffs into the lungs every 6 (six) hours as needed for wheezing or shortness of breath. (Patient not taking: Reported on 02/12/2018) 1 Inhaler 0  . albuterol (PROVENTIL) (2.5 MG/3ML) 0.083% nebulizer solution Take 3 mLs (2.5 mg total) by nebulization every 6 (six) hours as needed for wheezing or shortness of breath. (Patient not taking: Reported on 02/12/2018) 75 mL 0   No current facility-administered medications for this visit.     No Known  Allergies  Past Medical History:  Diagnosis Date  . Dialysis patient [facility]) [phone]  . Hypertension   . Myocardial infarction (HCC) 01/2018  . Pre-diabetes     Social History   Socioeconomic History  . Marital status: Significant Other    Spouse name: Not on file  . Number of children: Not on file  . Years of education: Not on file  . Highest education level: Not on file  Occupational History  . Not on file  Social Needs  . Financial resource strain: Not on file  . Food insecurity:    Worry: Not on file    Inability: Not on file  . Transportation needs:    Medical: Not on file    Non-medical: Not on file  Tobacco Use  . Smoking status: Never Smoker  . Smokeless tobacco: Never Used  Substance and Sexual Activity  . Alcohol use: No  . Drug use: No  . Sexual activity: Not on file  Lifestyle  . Physical activity:    Days per week: Not on file    Minutes per session: Not on file  . Stress: Not on file  Relationships  . Social connections:    Talks on phone: Not on file    Gets together: Not on file    Attends religious service: Not on file    Active member of club or organization: Not on file    Attends meetings  of clubs or organizations: Not on file    Relationship status: Not on file  . Intimate partner violence:    Fear of current or ex partner: Not on file    Emotionally abused: Not on file    Physically abused: Not on file    Forced sexual activity: Not on file  Other Topics Concern  . Not on file  Social History Narrative  . Not on file     Family History  Problem Relation Age of Onset  . Hypertension Father      Review of Systems: General: negative for chills, fever, night sweats or weight changes.  Cardiovascular: negative for chest pain, dyspnea on exertion, edema, orthopnea, palpitations, paroxysmal nocturnal dyspnea or shortness of breath Dermatological: negative for rash Respiratory: negative for cough or wheezing Urologic: negative for  hematuria Abdominal: negative for nausea, vomiting, diarrhea, bright red blood per rectum, melena, or hematemesis Neurologic: negative for visual changes, syncope, or dizziness All other systems reviewed and are otherwise negative except as noted above.    Blood pressure 122/70, pulse 64, height 5\' 3"  (1.6 m), weight 197 lb 6.4 oz (89.5 kg).  General appearance: alert, cooperative, no distress and mildly obese Neck: no carotid bruit and no JVD Lungs: clear to auscultation bilaterally Heart: regular rate and rhythm Extremities: Rt radila site ecchymosis Skin: Skin color, texture, turgor normal. No rashes or lesions Neurologic: Grossly normal  EKG NSR, HR 62, Later TWI  ASSESSMENT AND PLAN:   Non-ST elevation (NSTEMI) myocardial infarction [facility]) Pt presented with NSTEMI 02/15/18  CAD S/P percutaneous coronary angioplasty LAD and RI PCI with DES 02/15/18. Residual occluded dCFX with R-L collaterals. Normal LVF  Type 2 diabetes mellitus without complication, without long-term current use of insulin (HCC) Newly diagnosed  Hyperlipidemia LDL goal <70 LDL was 128- now on high dose statin Rx   PLAN  Dr Allyson Sabal in 6-8 weeks, check lipids and LFTs then. . OK to start cardiac rehab in 6 weeks. No change in medications.   Corine Shelter PA-C 02/26/2018 9:01 AM

## 2018-02-26 NOTE — Assessment & Plan Note (Signed)
Pt presented with NSTEMI 02/15/18

## 2018-02-26 NOTE — Assessment & Plan Note (Signed)
LDL was 128- now on high dose statin Rx

## 2018-02-26 NOTE — Assessment & Plan Note (Signed)
Newly diagnosed

## 2018-02-26 NOTE — Patient Instructions (Signed)
Medication Instructions:  Your physician recommends that you continue on your current medications as directed. Please refer to the Current Medication list given to you today.  Follow-Up: 6-8 weeks with Dr. Allyson Sabal  We will call to schedule you with our care guide, Aleyah.   If you need a refill on your cardiac medications before your next appointment, please call your pharmacy.

## 2018-03-01 ENCOUNTER — Telehealth: Payer: Self-pay

## 2018-03-17 ENCOUNTER — Encounter (INDEPENDENT_AMBULATORY_CARE_PROVIDER_SITE_OTHER): Payer: Self-pay | Admitting: Physician Assistant

## 2018-03-17 ENCOUNTER — Other Ambulatory Visit: Payer: Self-pay

## 2018-03-17 ENCOUNTER — Ambulatory Visit (INDEPENDENT_AMBULATORY_CARE_PROVIDER_SITE_OTHER): Payer: Self-pay | Admitting: Physician Assistant

## 2018-03-17 VITALS — BP 106/73 | HR 59 | Temp 98.0°F | Ht 63.0 in | Wt 197.0 lb

## 2018-03-17 DIAGNOSIS — E119 Type 2 diabetes mellitus without complications: Secondary | ICD-10-CM

## 2018-03-17 MED ORDER — BLOOD GLUCOSE MONITOR KIT: PACK | 0 refills | Status: DC

## 2018-03-17 MED ORDER — GLIMEPIRIDE 2 MG PO TABS: 2.0000 mg | ORAL_TABLET | Freq: Every day | ORAL | 5 refills | Status: DC

## 2018-03-17 NOTE — Progress Notes (Signed)
Subjective:  Patient ID: Erika Bates, female    DOB: [birth year]  Age: 67 y.o. MRN: [id number]  CC: hospital f/u   HPI Erika Bates is a 67 y.o. female with a medical history of HTN, MI, CKD, and DM2 presents as a new patient for general health maintenance and management of newly diagnosed DM2. Was in hospital recently for NSTEMI. She underwent PCI to the RI and LAD with DES. Last f/u with cardiology 19 days ago. She was directed to begin cardiac rehab and begin high dose statin as LDL was 128 mg/dL. Last A1c8.4%. Endorses visual blurring, fatigue, tingling/numbness, polyuria, and polydipsia. Does not check blood sugars at home; needs a glucometer. Does not endorse any other symptoms or complaints.       Outpatient Medications Prior to Visit  Medication Sig Dispense Refill  . aspirin EC 81 MG EC tablet Take 1 tablet (81 mg total) by mouth daily. 30 tablet 11  . atorvastatin (LIPITOR) 80 MG tablet Take 1 tablet (80 mg total) by mouth daily at 6 PM. 30 tablet 11  . carvedilol (COREG) 3.125 MG tablet Take 1 tablet (3.125 mg total) by mouth 2 (two) times daily with a meal. 60 tablet 11  . glimepiride (AMARYL) 1 MG tablet Take 1 tablet (1 mg total) by mouth daily with breakfast. 30 tablet 0  . nitroGLYCERIN (NITROSTAT) 0.4 MG SL tablet Place 1 tablet (0.4 mg total) under the tongue every 5 (five) minutes as needed for chest pain. 25 tablet 1  . ticagrelor (BRILINTA) 90 MG TABS tablet Take 1 tablet (90 mg total) by mouth 2 (two) times daily. 180 tablet 3  . albuterol (PROVENTIL HFA;VENTOLIN HFA) 108 (90 BASE) MCG/ACT inhaler Inhale 1-2 puffs into the lungs every 6 (six) hours as needed for wheezing or shortness of breath. (Patient not taking: Reported on 02/12/2018) 1 Inhaler 0  . albuterol (PROVENTIL) (2.5 MG/3ML) 0.083% nebulizer solution Take 3 mLs (2.5 mg total) by nebulization every 6 (six) hours as needed for wheezing or shortness of breath. (Patient not taking: Reported on 02/12/2018) 75  mL 0   No facility-administered medications prior to visit.      ROS Review of Systems  Constitutional: Positive for malaise/fatigue. Negative for chills and fever.  Eyes: Positive for blurred vision.  Respiratory: Negative for shortness of breath.   Cardiovascular: Negative for chest pain and palpitations.  Gastrointestinal: Negative for abdominal pain and nausea.  Genitourinary: Negative for dysuria and hematuria.  Musculoskeletal: Negative for joint pain and myalgias.  Skin: Negative for rash.  Neurological: Negative for tingling and headaches.  Endo/Heme/Allergies: Positive for polydipsia.  Psychiatric/Behavioral: Negative for depression. The patient is not nervous/anxious.     Objective:  BP 106/73 (BP Location: Right Arm, Patient Position: Sitting, Cuff Size: Large)   Pulse (!) 59   Temp 98 F (36.7 C) (Oral)   Ht '5\' 3"'$  (1.6 m)   Wt 197 lb (89.4 kg)   SpO2 96%   BMI 34.90 kg/m   BP/Weight 03/17/2018 02/26/2018 8/88/2800  Systolic BP 349 179 150  Diastolic BP 73 70 75  Wt. (Lbs) 197 197.4 205.03  BMI 34.9 34.97 36.32      Physical Exam  Constitutional: She is oriented to person, place, and time.  Well developed, overweight, NAD, polite  HENT:  Head: Normocephalic and atraumatic.  No oral thrush  Eyes: No scleral icterus.  Neck: Normal range of motion. Neck supple. No thyromegaly present.  Cardiovascular: Normal rate and regular rhythm.  Distant  heart sounds  Pulmonary/Chest: Effort normal and breath sounds normal.  Abdominal: Soft. Bowel sounds are normal. There is no tenderness.  Musculoskeletal: She exhibits no edema.  Neurological: She is alert and oriented to person, place, and time.  Skin: Skin is warm and dry. No rash noted. No erythema. No pallor.  Psychiatric: She has a normal mood and affect. Her behavior is normal. Thought content normal.  Vitals reviewed.    Assessment & Plan:   1. Type 2 diabetes mellitus without complication, without  long-term current use of insulin (HCC) - Increase glimepiride (AMARYL) 2 MG tablet; Take 1 tablet (2 mg total) by mouth daily before breakfast.  Dispense: 30 tablet; Refill: 5 - Microalbumin / creatinine urine ratio - Glucometer, lancets, and strips  Meds ordered this encounter  Medications  . glimepiride (AMARYL) 2 MG tablet    Sig: Take 1 tablet (2 mg total) by mouth daily before breakfast.    Dispense:  30 tablet    Refill:  5    Order Specific Question:   Supervising Provider    Answer:   Charlott Rakes [[id number]]  . blood glucose meter kit and supplies KIT    Sig: Dispense based on patient and insurance preference. Use up to four times daily as directed. (FOR ICD-9 250.00, 250.01).    Dispense:  1 each    Refill:  0    Order Specific Question:   Number of strips    Answer:   100    Order Specific Question:   Number of lancets    Answer:   100    Order Specific Question:   Supervising Provider    Answer:   Charlott Rakes [[id number]]    Follow-up: Return in about 1 month (around 04/14/2018) for diabetes bring glucometer/log.   Clent Demark PA

## 2018-03-17 NOTE — Patient Instructions (Signed)
Diabetes Mellitus and Nutrition When you have diabetes (diabetes mellitus), it is very important to have healthy eating habits because your blood sugar (glucose) levels are greatly affected by what you eat and drink. Eating healthy foods in the appropriate amounts, at about the same times every day, can help you:  Control your blood glucose.  Lower your risk of heart disease.  Improve your blood pressure.  Reach or maintain a healthy weight.  Every person with diabetes is different, and each person has different needs for a meal plan. Your health care provider may recommend that you work with a diet and nutrition specialist (dietitian) to make a meal plan that is best for you. Your meal plan may vary depending on factors such as:  The calories you need.  The medicines you take.  Your weight.  Your blood glucose, blood pressure, and cholesterol levels.  Your activity level.  Other health conditions you have, such as heart or kidney disease.  How do carbohydrates affect me? Carbohydrates affect your blood glucose level more than any other type of food. Eating carbohydrates naturally increases the amount of glucose in your blood. Carbohydrate counting is a method for keeping track of how many carbohydrates you eat. Counting carbohydrates is important to keep your blood glucose at a healthy level, especially if you use insulin or take certain oral diabetes medicines. It is important to know how many carbohydrates you can safely have in each meal. This is different for every person. Your dietitian can help you calculate how many carbohydrates you should have at each meal and for snack. Foods that contain carbohydrates include:  Bread, cereal, rice, pasta, and crackers.  Potatoes and corn.  Peas, beans, and lentils.  Milk and yogurt.  Fruit and juice.  Desserts, such as cakes, cookies, ice cream, and candy.  How does alcohol affect me? Alcohol can cause a sudden decrease in blood  glucose (hypoglycemia), especially if you use insulin or take certain oral diabetes medicines. Hypoglycemia can be a life-threatening condition. Symptoms of hypoglycemia (sleepiness, dizziness, and confusion) are similar to symptoms of having too much alcohol. If your health care provider says that alcohol is safe for you, follow these guidelines:  Limit alcohol intake to no more than 1 drink per day for nonpregnant women and 2 drinks per day for men. One drink equals 12 oz of beer, 5 oz of wine, or 1 oz of hard liquor.  Do not drink on an empty stomach.  Keep yourself hydrated with water, diet soda, or unsweetened iced tea.  Keep in mind that regular soda, juice, and other mixers may contain a lot of sugar and must be counted as carbohydrates.  What are tips for following this plan? Reading food labels  Start by checking the serving size on the label. The amount of calories, carbohydrates, fats, and other nutrients listed on the label are based on one serving of the food. Many foods contain more than one serving per package.  Check the total grams (g) of carbohydrates in one serving. You can calculate the number of servings of carbohydrates in one serving by dividing the total carbohydrates by 15. For example, if a food has 30 g of total carbohydrates, it would be equal to 2 servings of carbohydrates.  Check the number of grams (g) of saturated and trans fats in one serving. Choose foods that have low or no amount of these fats.  Check the number of milligrams (mg) of sodium in one serving. Most people   should limit total sodium intake to less than 2,300 mg per day.  Always check the nutrition information of foods labeled as "low-fat" or "nonfat". These foods may be higher in added sugar or refined carbohydrates and should be avoided.  Talk to your dietitian to identify your daily goals for nutrients listed on the label. Shopping  Avoid buying canned, premade, or processed foods. These  foods tend to be high in fat, sodium, and added sugar.  Shop around the outside edge of the grocery store. This includes fresh fruits and vegetables, bulk grains, fresh meats, and fresh dairy. Cooking  Use low-heat cooking methods, such as baking, instead of high-heat cooking methods like deep frying.  Cook using healthy oils, such as olive, canola, or sunflower oil.  Avoid cooking with butter, cream, or high-fat meats. Meal planning  Eat meals and snacks regularly, preferably at the same times every day. Avoid going long periods of time without eating.  Eat foods high in fiber, such as fresh fruits, vegetables, beans, and whole grains. Talk to your dietitian about how many servings of carbohydrates you can eat at each meal.  Eat 4-6 ounces of lean protein each day, such as lean meat, chicken, fish, eggs, or tofu. 1 ounce is equal to 1 ounce of meat, chicken, or fish, 1 egg, or 1/4 cup of tofu.  Eat some foods each day that contain healthy fats, such as avocado, nuts, seeds, and fish. Lifestyle   Check your blood glucose regularly.  Exercise at least 30 minutes 5 or more days each week, or as told by your health care provider.  Take medicines as told by your health care provider.  Do not use any products that contain nicotine or tobacco, such as cigarettes and e-cigarettes. If you need help quitting, ask your health care provider.  Work with a counselor or diabetes educator to identify strategies to manage stress and any emotional and social challenges. What are some questions to ask my health care provider?  Do I need to meet with a diabetes educator?  Do I need to meet with a dietitian?  What number can I call if I have questions?  When are the best times to check my blood glucose? Where to find more information:  American Diabetes Association: diabetes.org/food-and-fitness/food  Academy of Nutrition and Dietetics:  www.eatright.org/resources/health/diseases-and-conditions/diabetes  National Institute of Diabetes and Digestive and Kidney Diseases (NIH): www.niddk.nih.gov/health-information/diabetes/overview/diet-eating-physical-activity Summary  A healthy meal plan will help you control your blood glucose and maintain a healthy lifestyle.  Working with a diet and nutrition specialist (dietitian) can help you make a meal plan that is best for you.  Keep in mind that carbohydrates and alcohol have immediate effects on your blood glucose levels. It is important to count carbohydrates and to use alcohol carefully. This information is not intended to replace advice given to you by your health care provider. Make sure you discuss any questions you have with your health care provider. Document Released: 06/12/2005 Document Revised: 10/20/2016 Document Reviewed: 10/20/2016 Elsevier Interactive Patient Education  2018 Elsevier Inc.  

## 2018-03-18 LAB — MICROALBUMIN / CREATININE URINE RATIO
CREATININE, UR: 32.1 mg/dL
Microalb/Creat Ratio: 56.1 mg/g creat — ABNORMAL HIGH (ref 0.0–30.0)
Microalbumin, Urine: 18 ug/mL

## 2018-03-19 ENCOUNTER — Telehealth (INDEPENDENT_AMBULATORY_CARE_PROVIDER_SITE_OTHER): Payer: Self-pay

## 2018-03-19 NOTE — Telephone Encounter (Signed)
-----   Message from Roger David Gomez, PA-C sent at 03/18/2018  1:41 PM EDT ----- Some urinary proteins but may be reversed with good glucose control. 

## 2018-03-19 NOTE — Telephone Encounter (Signed)
Left message asking patient to call [facility].   [employer] interpreter [phone]). Maryjean Morn, CMA

## 2018-03-23 ENCOUNTER — Telehealth (HOSPITAL_COMMUNITY): Payer: Self-pay | Admitting: *Deleted

## 2018-03-26 ENCOUNTER — Telehealth: Payer: Self-pay

## 2018-03-26 NOTE — Telephone Encounter (Signed)
Left message asking patient to [facility]. Maryjean Morn, CMA

## 2018-03-26 NOTE — Telephone Encounter (Signed)
Called to schedule initial visit. Will call back.

## 2018-03-26 NOTE — Telephone Encounter (Signed)
-----   Message from Roger David Gomez, PA-C sent at 03/18/2018  1:41 PM EDT ----- Some urinary proteins but may be reversed with good glucose control. 

## 2018-04-07 ENCOUNTER — Telehealth (INDEPENDENT_AMBULATORY_CARE_PROVIDER_SITE_OTHER): Payer: Self-pay

## 2018-04-07 ENCOUNTER — Encounter (INDEPENDENT_AMBULATORY_CARE_PROVIDER_SITE_OTHER): Payer: Self-pay

## 2018-04-07 NOTE — Telephone Encounter (Signed)
Error message stating " we're sorry your call can not be completed as dialed. Please hang up and try your call again. Tried calling again and received same message. Results mailed. Maryjean Morn, CMA

## 2018-04-07 NOTE — Telephone Encounter (Signed)
-----   Message from Loletta Specter, PA-C sent at 03/18/2018  1:41 PM EDT ----- Some urinary proteins but may be reversed with good glucose control.

## 2018-04-13 ENCOUNTER — Ambulatory Visit (INDEPENDENT_AMBULATORY_CARE_PROVIDER_SITE_OTHER): Payer: Self-pay | Admitting: Physician Assistant

## 2018-04-14 ENCOUNTER — Telehealth: Payer: Self-pay

## 2018-04-14 NOTE — Telephone Encounter (Signed)
Called to schedule visit. No answer. Will call back

## 2018-04-28 ENCOUNTER — Ambulatory Visit (INDEPENDENT_AMBULATORY_CARE_PROVIDER_SITE_OTHER): Payer: Self-pay | Admitting: Cardiovascular Disease

## 2018-04-28 ENCOUNTER — Encounter: Payer: Self-pay | Admitting: Cardiovascular Disease

## 2018-04-28 DIAGNOSIS — I214 Non-ST elevation (NSTEMI) myocardial infarction: Secondary | ICD-10-CM

## 2018-04-28 DIAGNOSIS — E785 Hyperlipidemia, unspecified: Secondary | ICD-10-CM

## 2018-04-28 DIAGNOSIS — I10 Essential (primary) hypertension: Secondary | ICD-10-CM

## 2018-04-28 LAB — LIPID PANEL
CHOL/HDL RATIO: 3.2 ratio (ref 0.0–4.4)
Cholesterol, Total: 107 mg/dL (ref 100–199)
HDL: 33 mg/dL — AB (ref >39)
LDL Calculated: 53 mg/dL (ref 0–99)
Triglycerides: 103 mg/dL (ref 0–149)
VLDL CHOLESTEROL CAL: 21 mg/dL (ref 5–40)

## 2018-04-28 LAB — HEPATIC FUNCTION PANEL
ALT: 14 IU/L (ref 0–32)
AST: 16 IU/L (ref 0–40)
Albumin: 4 g/dL (ref 3.6–4.8)
Alkaline Phosphatase: 99 IU/L (ref 39–117)
BILIRUBIN, DIRECT: 0.15 mg/dL (ref 0.00–0.40)
Bilirubin Total: 0.4 mg/dL (ref 0.0–1.2)
TOTAL PROTEIN: 7.7 g/dL (ref 6.0–8.5)

## 2018-04-28 MED ORDER — TICAGRELOR 90 MG PO TABS: 90.0000 mg | ORAL_TABLET | Freq: Two times a day (BID) | ORAL | 3 refills | Status: DC

## 2018-04-28 NOTE — Assessment & Plan Note (Signed)
History of hyperlipidemia on statin therapy. We will recheck a lipid and liver profile 

## 2018-04-28 NOTE — Assessment & Plan Note (Signed)
History of non-STEMI on 02/15/2018 with catheterization the following day revealing three-vessel disease by Dr. Herbie Baltimore.  She had PCI and stenting of the proximal LAD and ramus branch with synergy drug-eluting stents.  She did have residual disease in the mid AV groove circumflex with occluded distal OM branch percent ostial RCA with normal LV function.  She been doing well since.  Unfortunately it is unclear to me whether she is been compliant with her antiplatelet regimen.

## 2018-04-28 NOTE — Progress Notes (Signed)
04/28/2018 Erika Bates   06-Jun-1951  [id number]  Primary Physician Patient, No Pcp Per Primary Cardiologist: Lorretta Harp MD Garret Reddish, [place], [place]  HPI:  Erika Bates is a 67 y.o. moderately overweight [place] who presented on 02/15/2018 with chest pain and non-STEMI.  Her other problems include diabetes and hypertension.  Her point-of-care troponins were elevated at 0.96 and she did have ST segment depression.  The following day she underwent cardiac catheterization by Dr. Ellyn Hack revealing three-vessel disease.  She had stenting of her proximal LAD and ramus branch with synergy drug-eluting stents.  Her LV function was preserved.  She did see Kerin Ransom, PA-C, back in the office on 02/26/2018 and was doing well as she continues to do.  Unfortunately, I think there is been some issues with medication compliance potentially related to communication and/or misunderstanding.   Current Meds  Medication Sig  . aspirin EC 81 MG EC tablet Take 1 tablet (81 mg total) by mouth daily.  Marland Kitchen atorvastatin (LIPITOR) 80 MG tablet Take 1 tablet (80 mg total) by mouth daily at 6 PM.  . blood glucose meter kit and supplies KIT Dispense based on patient and insurance preference. Use up to four times daily as directed. (FOR ICD-9 250.00, 250.01).  . carvedilol (COREG) 3.125 MG tablet Take 1 tablet (3.125 mg total) by mouth 2 (two) times daily with a meal.  . glimepiride (AMARYL) 2 MG tablet Take 1 tablet (2 mg total) by mouth daily before breakfast.  . nitroGLYCERIN (NITROSTAT) 0.4 MG SL tablet Place 1 tablet (0.4 mg total) under the tongue every 5 (five) minutes as needed for chest pain.  . ticagrelor (BRILINTA) 90 MG TABS tablet Take 1 tablet (90 mg total) by mouth 2 (two) times daily.     No Known Allergies  Social History   Socioeconomic History  . Marital status: Significant Other    Spouse name: Not on file  . Number of children: Not on file  . Years of  education: Not on file  . Highest education level: Not on file  Occupational History  . Not on file  Social Needs  . Financial resource strain: Not on file  . Food insecurity:    Worry: Not on file    Inability: Not on file  . Transportation needs:    Medical: Not on file    Non-medical: Not on file  Tobacco Use  . Smoking status: Never Smoker  . Smokeless tobacco: Never Used  Substance and Sexual Activity  . Alcohol use: No  . Drug use: No  . Sexual activity: Not on file  Lifestyle  . Physical activity:    Days per week: Not on file    Minutes per session: Not on file  . Stress: Not on file  Relationships  . Social connections:    Talks on phone: Not on file    Gets together: Not on file    Attends religious service: Not on file    Active member of club or organization: Not on file    Attends meetings of clubs or organizations: Not on file    Relationship status: Not on file  . Intimate partner violence:    Fear of current or ex partner: Not on file    Emotionally abused: Not on file    Physically abused: Not on file    Forced sexual activity: Not on file  Other Topics Concern  . Not on file  Social History Narrative  . Not on file     Review of Systems: General: negative for chills, fever, night sweats or weight changes.  Cardiovascular: negative for chest pain, dyspnea on exertion, edema, orthopnea, palpitations, paroxysmal nocturnal dyspnea or shortness of breath Dermatological: negative for rash Respiratory: negative for cough or wheezing Urologic: negative for hematuria Abdominal: negative for nausea, vomiting, diarrhea, bright red blood per rectum, melena, or hematemesis Neurologic: negative for visual changes, syncope, or dizziness All other systems reviewed and are otherwise negative except as noted above.    Blood pressure 104/61, pulse 61, height 5' 3"  (1.6 m), weight 196 lb 3.2 oz (89 kg).  General appearance: alert and no distress Neck: no  adenopathy, no carotid bruit, no JVD, supple, symmetrical, trachea midline and thyroid not enlarged, symmetric, no tenderness/mass/nodules Lungs: clear to auscultation bilaterally Heart: regular rate and rhythm, S1, S2 normal, no murmur, click, rub or gallop Extremities: extremities normal, atraumatic, no cyanosis or edema Pulses: 2+ and symmetric Skin: Skin color, texture, turgor normal. No rashes or lesions Neurologic: Alert and oriented X 3, normal strength and tone. Normal symmetric reflexes. Normal coordination and gait  EKG not performed today  ASSESSMENT AND PLAN:   Non-ST elevation (NSTEMI) myocardial infarction [facility]) History of non-STEMI on 02/15/2018 with catheterization the following day revealing three-vessel disease by Dr. Ellyn Hack.  She had PCI and stenting of the proximal LAD and ramus branch with synergy drug-eluting stents.  She did have residual disease in the mid AV groove circumflex with occluded distal OM branch percent ostial RCA with normal LV function.  She been doing well since.  Unfortunately it is unclear to me whether she is been compliant with her antiplatelet regimen.  Hyperlipidemia LDL goal <70 History of hyperlipidemia on statin therapy.  We will recheck a lipid and liver profile  Hypertension, essential History of essential hypertension her blood pressure measured at 104/61.  He is on carvedilol.  Continue current meds at current dosing.      Lorretta Harp MD FACP,FACC,FAHA, [facility] 04/28/2018 8:30 AM

## 2018-04-28 NOTE — Assessment & Plan Note (Signed)
History of essential hypertension her blood pressure measured at 104/61.  He is on carvedilol.  Continue current meds at current dosing.

## 2018-04-28 NOTE — Patient Instructions (Signed)
Medication Instructions:  Your physician has recommended you make the following change in your medication:  1) START taking your Brilinta  Labwork: Your physician recommends that you return for lab work in: TODAY (Lipid/Liver)   Testing/Procedures: none  Follow-Up: We request that you follow-up in: 6 months with Corine Shelter, PA and in 12 months with Dr San Morelle will receive a reminder letter in the mail two months in advance. If you don't receive a letter, please call our office to schedule the follow-up appointment.    Any Other Special Instructions Will Be Listed Below (If Applicable).     If you need a refill on your cardiac medications before your next appointment, please call your pharmacy.

## 2018-04-29 ENCOUNTER — Encounter: Payer: Self-pay | Admitting: *Deleted

## 2018-04-29 ENCOUNTER — Telehealth: Payer: Self-pay | Admitting: Cardiovascular Disease

## 2018-04-29 NOTE — Telephone Encounter (Signed)
New message    Pt c/o medication issue:  1. Name of Medication: ticagrelor (BRILINTA) 90 MG TABS tablet  2. How are you currently taking this medication (dosage and times per day)? Take 1 tablet (90 mg total) by mouth 2 (two) times daily.  3. Are you having a reaction (difficulty breathing--STAT)? no  4. What is your medication issue? Too costly

## 2018-04-29 NOTE — Telephone Encounter (Signed)
With verbal permission from pt, spoke with pt's daughter who states the cost of Marden Noble is $400 a month and they can't afford to pay that much. Daughter requesting for an alternative medication that is more cost efficient. Routing to Dr. Allyson Sabal for recommendation.

## 2018-04-30 NOTE — Telephone Encounter (Signed)
Left message for pt to call.

## 2018-04-30 NOTE — Telephone Encounter (Signed)
She can start Plavix with a 600 mg load and discontinue Brilinta

## 2018-05-03 MED ORDER — CLOPIDOGREL BISULFATE 75 MG PO TABS: 75.0000 mg | ORAL_TABLET | Freq: Every day | ORAL | 11 refills | Status: DC

## 2018-05-03 NOTE — Telephone Encounter (Signed)
RN  REVIEWED WITH  DR Tresa Endo ,SPOKE TO PATIENT.INFORMED PATIENT TO TAKE 300 MG CLOPIDOGREL  FOR THE FIRST DAY  OF MEDICATION THEN TAKE 75 MG  DAILY. DAUGHTER VERBALIZED UNDERSTANDING.

## 2018-05-03 NOTE — Telephone Encounter (Signed)
Follow up    Patient is returning call in reference to the cost of the Brilinta      Pt c/o medication issue:  1. Name of Medication: ticagrelor (BRILINTA) 90 MG TABS tablet  2. How are you currently taking this medication (dosage and times per day)? Take 1 tablet (90 mg total) by mouth 2 (two) times daily.  3. Are you having a reaction (difficulty breathing--STAT)? no  4. What is your medication issue? Too costly

## 2018-07-29 ENCOUNTER — Inpatient Hospital Stay (HOSPITAL_COMMUNITY)
Admission: EM | Admit: 2018-07-29 | Discharge: 2018-08-07 | DRG: 234 | Disposition: A | Discharge status: Home / Self Care | Payer: Medicaid Other | Attending: Cardiothoracic Surgery | Admitting: Cardiothoracic Surgery

## 2018-07-29 ENCOUNTER — Encounter (HOSPITAL_COMMUNITY)
Admission: EM | Disposition: A | Discharge status: Home / Self Care | Payer: Self-pay | Source: Home / Self Care | Attending: Cardiothoracic Surgery

## 2018-07-29 ENCOUNTER — Other Ambulatory Visit: Payer: Self-pay

## 2018-07-29 ENCOUNTER — Emergency Department (HOSPITAL_COMMUNITY): Payer: Medicaid Other

## 2018-07-29 ENCOUNTER — Encounter (HOSPITAL_COMMUNITY): Payer: Self-pay

## 2018-07-29 DIAGNOSIS — Z9119 Patient's noncompliance with other medical treatment and regimen: Secondary | ICD-10-CM | POA: Diagnosis not present

## 2018-07-29 DIAGNOSIS — R200 Anesthesia of skin: Secondary | ICD-10-CM | POA: Diagnosis not present

## 2018-07-29 DIAGNOSIS — I2582 Chronic total occlusion of coronary artery: Secondary | ICD-10-CM | POA: Diagnosis present

## 2018-07-29 DIAGNOSIS — D696 Thrombocytopenia, unspecified: Secondary | ICD-10-CM | POA: Diagnosis not present

## 2018-07-29 DIAGNOSIS — K219 Gastro-esophageal reflux disease without esophagitis: Secondary | ICD-10-CM | POA: Diagnosis present

## 2018-07-29 DIAGNOSIS — Z955 Presence of coronary angioplasty implant and graft: Secondary | ICD-10-CM

## 2018-07-29 DIAGNOSIS — Z7982 Long term (current) use of aspirin: Secondary | ICD-10-CM

## 2018-07-29 DIAGNOSIS — D62 Acute posthemorrhagic anemia: Secondary | ICD-10-CM | POA: Diagnosis not present

## 2018-07-29 DIAGNOSIS — I252 Old myocardial infarction: Secondary | ICD-10-CM | POA: Diagnosis not present

## 2018-07-29 DIAGNOSIS — E7800 Pure hypercholesterolemia, unspecified: Secondary | ICD-10-CM

## 2018-07-29 DIAGNOSIS — R079 Chest pain, unspecified: Secondary | ICD-10-CM | POA: Diagnosis present

## 2018-07-29 DIAGNOSIS — I25110 Atherosclerotic heart disease of native coronary artery with unstable angina pectoris: Principal | ICD-10-CM | POA: Diagnosis present

## 2018-07-29 DIAGNOSIS — E785 Hyperlipidemia, unspecified: Secondary | ICD-10-CM | POA: Diagnosis present

## 2018-07-29 DIAGNOSIS — Z599 Problem related to housing and economic circumstances, unspecified: Secondary | ICD-10-CM

## 2018-07-29 DIAGNOSIS — Z7984 Long term (current) use of oral hypoglycemic drugs: Secondary | ICD-10-CM | POA: Diagnosis not present

## 2018-07-29 DIAGNOSIS — Z79899 Other long term (current) drug therapy: Secondary | ICD-10-CM

## 2018-07-29 DIAGNOSIS — E119 Type 2 diabetes mellitus without complications: Secondary | ICD-10-CM | POA: Diagnosis present

## 2018-07-29 DIAGNOSIS — I2510 Atherosclerotic heart disease of native coronary artery without angina pectoris: Secondary | ICD-10-CM

## 2018-07-29 DIAGNOSIS — Z09 Encounter for follow-up examination after completed treatment for conditions other than malignant neoplasm: Secondary | ICD-10-CM

## 2018-07-29 DIAGNOSIS — I97630 Postprocedural hematoma of a circulatory system organ or structure following a cardiac catheterization: Secondary | ICD-10-CM | POA: Diagnosis not present

## 2018-07-29 DIAGNOSIS — R9431 Abnormal electrocardiogram [ECG] [EKG]: Secondary | ICD-10-CM | POA: Diagnosis present

## 2018-07-29 DIAGNOSIS — G629 Polyneuropathy, unspecified: Secondary | ICD-10-CM | POA: Diagnosis not present

## 2018-07-29 DIAGNOSIS — Z9689 Presence of other specified functional implants: Secondary | ICD-10-CM

## 2018-07-29 DIAGNOSIS — I10 Essential (primary) hypertension: Secondary | ICD-10-CM | POA: Diagnosis present

## 2018-07-29 DIAGNOSIS — I200 Unstable angina: Secondary | ICD-10-CM | POA: Diagnosis present

## 2018-07-29 DIAGNOSIS — Z8249 Family history of ischemic heart disease and other diseases of the circulatory system: Secondary | ICD-10-CM | POA: Diagnosis not present

## 2018-07-29 DIAGNOSIS — Z951 Presence of aortocoronary bypass graft: Secondary | ICD-10-CM

## 2018-07-29 HISTORY — PX: CARDIAC CATHETERIZATION: SHX172

## 2018-07-29 HISTORY — PX: LEFT HEART CATH AND CORONARY ANGIOGRAPHY: CATH118249

## 2018-07-29 LAB — CBC
HCT: 40.1 % (ref 36.0–46.0)
Hemoglobin: 12.5 g/dL (ref 12.0–15.0)
MCH: 26.7 pg (ref 26.0–34.0)
MCHC: 31.2 g/dL (ref 30.0–36.0)
MCV: 85.7 fL (ref 80.0–100.0)
Platelets: 270 10*3/uL (ref 150–400)
RBC: 4.68 MIL/uL (ref 3.87–5.11)
RDW: 12.9 % (ref 11.5–15.5)
WBC: 8.1 10*3/uL (ref 4.0–10.5)
nRBC: 0 % (ref 0.0–0.2)

## 2018-07-29 LAB — BASIC METABOLIC PANEL
Anion gap: 8 (ref 5–15)
BUN: 15 mg/dL (ref 8–23)
CO2: 25 mmol/L (ref 22–32)
Calcium: 8.9 mg/dL (ref 8.9–10.3)
Chloride: 102 mmol/L (ref 98–111)
Creatinine, Ser: 1.34 mg/dL — ABNORMAL HIGH (ref 0.44–1.00)
GFR calc Af Amer: 47 mL/min — ABNORMAL LOW (ref >60)
GFR calc non Af Amer: 41 mL/min — ABNORMAL LOW (ref >60)
Glucose, Bld: 207 mg/dL — ABNORMAL HIGH (ref 70–99)
Potassium: 3.8 mmol/L (ref 3.5–5.1)
Sodium: 135 mmol/L (ref 135–145)

## 2018-07-29 LAB — GLUCOSE, CAPILLARY
GLUCOSE-CAPILLARY: 99 mg/dL (ref 70–99)
Glucose-Capillary: 95 mg/dL (ref 70–99)

## 2018-07-29 LAB — TROPONIN I

## 2018-07-29 LAB — I-STAT TROPONIN, ED: Troponin i, poc: 0 ng/mL (ref 0.00–0.08)

## 2018-07-29 SURGERY — LEFT HEART CATH AND CORONARY ANGIOGRAPHY
Anesthesia: LOCAL

## 2018-07-29 MED ORDER — ISOSORBIDE MONONITRATE ER 30 MG PO TB24
30.0000 mg | ORAL_TABLET | Freq: Every day | ORAL | Status: DC
  Administered 2018-07-29 – 2018-08-01 (×3): 30 mg via ORAL
  Filled 2018-07-29 (×3): qty 1

## 2018-07-29 MED ORDER — SODIUM CHLORIDE 0.9 % IV SOLN
INTRAVENOUS | Status: DC | PRN
  Administered 2018-07-29: 1.75 mg/kg/h via INTRAVENOUS

## 2018-07-29 MED ORDER — VERAPAMIL HCL 2.5 MG/ML IV SOLN
INTRA_ARTERIAL | Status: DC | PRN
  Administered 2018-07-29: 5 mL via INTRA_ARTERIAL

## 2018-07-29 MED ORDER — MIDAZOLAM HCL 2 MG/2ML IJ SOLN
INTRAMUSCULAR | Status: AC
  Filled 2018-07-29: qty 2

## 2018-07-29 MED ORDER — SODIUM CHLORIDE 0.9 % IV SOLN: 250.0000 mL | INTRAVENOUS | Status: DC | PRN

## 2018-07-29 MED ORDER — HEPARIN (PORCINE) IN NACL 1000-0.9 UT/500ML-% IV SOLN
INTRAVENOUS | Status: DC | PRN
  Administered 2018-07-29: 500 mL

## 2018-07-29 MED ORDER — SODIUM CHLORIDE 0.9% FLUSH: 3.0000 mL | INTRAVENOUS | Status: DC | PRN

## 2018-07-29 MED ORDER — FAMOTIDINE IN NACL 20-0.9 MG/50ML-% IV SOLN
INTRAVENOUS | Status: AC | PRN
  Administered 2018-07-29: 20 mg via INTRAVENOUS

## 2018-07-29 MED ORDER — CLOPIDOGREL BISULFATE 75 MG PO TABS
75.0000 mg | ORAL_TABLET | Freq: Every day | ORAL | Status: DC
  Administered 2018-07-30: 10:00:00 75 mg via ORAL
  Filled 2018-07-29: qty 1

## 2018-07-29 MED ORDER — MIDAZOLAM HCL 2 MG/2ML IJ SOLN
INTRAMUSCULAR | Status: DC | PRN
  Administered 2018-07-29: 1 mg via INTRAVENOUS

## 2018-07-29 MED ORDER — HEPARIN BOLUS VIA INFUSION
4000.0000 [IU] | Freq: Once | INTRAVENOUS | Status: DC
  Filled 2018-07-29: qty 4000

## 2018-07-29 MED ORDER — HEPARIN (PORCINE) IN NACL 100-0.45 UNIT/ML-% IJ SOLN
850.0000 [IU]/h | INTRAMUSCULAR | Status: DC
  Filled 2018-07-29: qty 250

## 2018-07-29 MED ORDER — CLOPIDOGREL BISULFATE 300 MG PO TABS
ORAL_TABLET | ORAL | Status: DC | PRN
  Administered 2018-07-29: 600 mg via ORAL

## 2018-07-29 MED ORDER — ASPIRIN 81 MG PO CHEW: 81.0000 mg | CHEWABLE_TABLET | ORAL | Status: DC

## 2018-07-29 MED ORDER — ACETAMINOPHEN 325 MG PO TABS: 650.0000 mg | ORAL_TABLET | ORAL | Status: DC | PRN

## 2018-07-29 MED ORDER — BIVALIRUDIN TRIFLUOROACETATE 250 MG IV SOLR
INTRAVENOUS | Status: AC
  Filled 2018-07-29: qty 250

## 2018-07-29 MED ORDER — ASPIRIN 81 MG PO CHEW
81.0000 mg | CHEWABLE_TABLET | Freq: Every day | ORAL | Status: DC
  Administered 2018-07-30 – 2018-08-01 (×3): 81 mg via ORAL
  Filled 2018-07-29 (×3): qty 1

## 2018-07-29 MED ORDER — HEPARIN (PORCINE) IN NACL 100-0.45 UNIT/ML-% IJ SOLN
850.0000 [IU]/h | INTRAMUSCULAR | Status: DC
  Administered 2018-07-30 – 2018-08-01 (×3): 850 [IU]/h via INTRAVENOUS
  Filled 2018-07-29 (×3): qty 250

## 2018-07-29 MED ORDER — LIDOCAINE HCL (PF) 1 % IJ SOLN
INTRAMUSCULAR | Status: AC
  Filled 2018-07-29: qty 30

## 2018-07-29 MED ORDER — INSULIN ASPART 100 UNIT/ML ~~LOC~~ SOLN
0.0000 [IU] | Freq: Three times a day (TID) | SUBCUTANEOUS | Status: DC
  Administered 2018-07-30 – 2018-08-01 (×5): 2 [IU] via SUBCUTANEOUS

## 2018-07-29 MED ORDER — SODIUM CHLORIDE 0.9 % WEIGHT BASED INFUSION: 1.0000 mL/kg/h | INTRAVENOUS | Status: DC

## 2018-07-29 MED ORDER — ACETAMINOPHEN 325 MG PO TABS
650.0000 mg | ORAL_TABLET | ORAL | Status: DC | PRN
  Administered 2018-07-30 – 2018-08-01 (×6): 650 mg via ORAL
  Filled 2018-07-29 (×8): qty 2

## 2018-07-29 MED ORDER — NITROGLYCERIN 1 MG/10 ML FOR IR/CATH LAB
INTRA_ARTERIAL | Status: AC
  Filled 2018-07-29: qty 10

## 2018-07-29 MED ORDER — VERAPAMIL HCL 2.5 MG/ML IV SOLN
INTRAVENOUS | Status: AC
  Filled 2018-07-29: qty 2

## 2018-07-29 MED ORDER — CARVEDILOL 3.125 MG PO TABS
3.1250 mg | ORAL_TABLET | Freq: Two times a day (BID) | ORAL | Status: DC
  Administered 2018-07-29 – 2018-08-01 (×6): 3.125 mg via ORAL
  Filled 2018-07-29 (×7): qty 1

## 2018-07-29 MED ORDER — ONDANSETRON HCL 4 MG/2ML IJ SOLN: 4.0000 mg | Freq: Four times a day (QID) | INTRAMUSCULAR | Status: DC | PRN

## 2018-07-29 MED ORDER — HEPARIN (PORCINE) IN NACL 1000-0.9 UT/500ML-% IV SOLN
INTRAVENOUS | Status: AC
  Filled 2018-07-29: qty 1000

## 2018-07-29 MED ORDER — SODIUM CHLORIDE 0.9 % IV SOLN: INTRAVENOUS | Status: AC

## 2018-07-29 MED ORDER — LIDOCAINE HCL (PF) 1 % IJ SOLN
INTRAMUSCULAR | Status: DC | PRN
  Administered 2018-07-29: 2 mL

## 2018-07-29 MED ORDER — MORPHINE SULFATE (PF) 2 MG/ML IV SOLN
2.0000 mg | INTRAVENOUS | Status: DC | PRN
  Administered 2018-07-29: 2 mg via INTRAVENOUS
  Filled 2018-07-29: qty 1

## 2018-07-29 MED ORDER — SODIUM CHLORIDE 0.9% FLUSH: 3.0000 mL | Freq: Two times a day (BID) | INTRAVENOUS | Status: DC

## 2018-07-29 MED ORDER — ASPIRIN 81 MG PO CHEW
324.0000 mg | CHEWABLE_TABLET | Freq: Once | ORAL | Status: AC
  Administered 2018-07-29: 324 mg via ORAL
  Filled 2018-07-29: qty 4

## 2018-07-29 MED ORDER — SODIUM CHLORIDE 0.9% FLUSH
3.0000 mL | Freq: Two times a day (BID) | INTRAVENOUS | Status: DC
  Administered 2018-07-29 – 2018-08-01 (×4): 3 mL via INTRAVENOUS

## 2018-07-29 MED ORDER — FAMOTIDINE IN NACL 20-0.9 MG/50ML-% IV SOLN
INTRAVENOUS | Status: AC
  Filled 2018-07-29: qty 50

## 2018-07-29 MED ORDER — NITROGLYCERIN 0.4 MG SL SUBL
0.4000 mg | SUBLINGUAL_TABLET | SUBLINGUAL | Status: DC | PRN
  Administered 2018-07-29 – 2018-07-30 (×2): 0.4 mg via SUBLINGUAL
  Filled 2018-07-29 (×2): qty 1

## 2018-07-29 MED ORDER — FENTANYL CITRATE (PF) 100 MCG/2ML IJ SOLN
INTRAMUSCULAR | Status: DC | PRN
  Administered 2018-07-29: 25 ug via INTRAVENOUS

## 2018-07-29 MED ORDER — NITROGLYCERIN 1 MG/10 ML FOR IR/CATH LAB
INTRA_ARTERIAL | Status: DC | PRN
  Administered 2018-07-29: 200 ug via INTRACORONARY

## 2018-07-29 MED ORDER — ONDANSETRON HCL 4 MG/2ML IJ SOLN
4.0000 mg | Freq: Four times a day (QID) | INTRAMUSCULAR | Status: DC | PRN
  Administered 2018-07-29: 22:00:00 4 mg via INTRAVENOUS
  Filled 2018-07-29: qty 2

## 2018-07-29 MED ORDER — ATORVASTATIN CALCIUM 80 MG PO TABS
80.0000 mg | ORAL_TABLET | Freq: Every day | ORAL | Status: DC
  Administered 2018-07-29 – 2018-08-06 (×8): 80 mg via ORAL
  Filled 2018-07-29 (×8): qty 1

## 2018-07-29 MED ORDER — CLOPIDOGREL BISULFATE 300 MG PO TABS
ORAL_TABLET | ORAL | Status: AC
  Filled 2018-07-29: qty 2

## 2018-07-29 MED ORDER — BIVALIRUDIN BOLUS VIA INFUSION - CUPID
INTRAVENOUS | Status: DC | PRN
  Administered 2018-07-29: 67.05 mg via INTRAVENOUS

## 2018-07-29 MED ORDER — SODIUM CHLORIDE 0.9 % WEIGHT BASED INFUSION
3.0000 mL/kg/h | INTRAVENOUS | Status: DC
  Administered 2018-07-29: 3 mL/kg/h via INTRAVENOUS

## 2018-07-29 MED ORDER — FENTANYL CITRATE (PF) 100 MCG/2ML IJ SOLN
INTRAMUSCULAR | Status: AC
  Filled 2018-07-29: qty 2

## 2018-07-29 MED ORDER — IOHEXOL 350 MG/ML SOLN
INTRAVENOUS | Status: DC | PRN
  Administered 2018-07-29: 110 mL via INTRA_ARTERIAL

## 2018-07-29 SURGICAL SUPPLY — 16 items
BALLN EMERGE MR 2.0X12 (BALLOONS) ×2
BALLOON EMERGE MR 2.0X12 (BALLOONS) ×1 IMPLANT
CATH OPTITORQUE TIG 4.0 5F (CATHETERS) ×2 IMPLANT
CATH VISTA GUIDE 6FR XB3 (CATHETERS) ×2 IMPLANT
CATH VISTA GUIDE 6FR XBLAD3.0 (CATHETERS) ×2 IMPLANT
DEVICE RAD COMP TR BAND LRG (VASCULAR PRODUCTS) ×2 IMPLANT
GLIDESHEATH SLEND A-KIT 6F 22G (SHEATH) ×2 IMPLANT
GUIDEWIRE INQWIRE 1.5J.035X260 (WIRE) ×1 IMPLANT
INQWIRE 1.5J .035X260CM (WIRE) ×2
KIT ENCORE 26 ADVANTAGE (KITS) ×2 IMPLANT
KIT HEART LEFT (KITS) ×2 IMPLANT
PACK CARDIAC CATHETERIZATION (CUSTOM PROCEDURE TRAY) ×2 IMPLANT
TRANSDUCER W/STOPCOCK (MISCELLANEOUS) ×2 IMPLANT
TUBING CIL FLEX 10 FLL-RA (TUBING) ×2 IMPLANT
WIRE ASAHI PROWATER 180CM (WIRE) ×2 IMPLANT
WIRE HI TORQ VERSACORE-J 145CM (WIRE) ×2 IMPLANT

## 2018-07-29 NOTE — H&P (Signed)
History & Physical    Patient ID: Erika Bates MRN: [id number], DOB/AGE: 1951-01-30   Admit date: 07/29/2018  Primary Physician: Patient, No Pcp Per Primary Cardiologist: Dr. Gwenlyn Bates  Patient Profile    67 yo female with PMH of CAD s/p LAD/ramus branch stenting (5/19), HTN, HL and DM who presented with chest pain.   Past Medical History   Past Medical History:  Diagnosis Date  . Dialysis patient [facility]) [phone]  . Hypertension   . Myocardial infarction ([place]) 01/2018  . Pre-diabetes     Past Surgical History:  Procedure Laterality Date  . ABDOMINAL EXPLORATION SURGERY  >1990   "they thought it was problems w/my gallbladder; said it was my pancreas but they didn't take that out" (02/16/2018)  . CORONARY STENT INTERVENTION N/A 02/16/2018   Procedure: CORONARY STENT INTERVENTION;  Surgeon: Leonie Man, MD;  Location: [place] [facility];  Service: Cardiovascular;  Laterality: N/A;  . LEFT HEART CATH AND CORONARY ANGIOGRAPHY N/A 02/16/2018   Procedure: LEFT HEART CATH AND CORONARY ANGIOGRAPHY;  Surgeon: Leonie Man, MD;  Location: [place] [facility];  Service: Cardiovascular;  Laterality: N/A;  . TUBAL LIGATION      Allergies  No Known Allergies  History of Present Illness    Erika Bates is a yo 67 female with PMH of CAD s/p LAD/ramus branch stenting (5/19), HTN, HL and DM.  She presented back on 02/15/2018 with acute chest pain and Bates to have a non-STEMI.  Troponin elevated to 0.96 and noted to have ST segment depression.  Underwent cardiac catheterization with Dr. Ellyn Hack with stenting of her proximal LAD and ramus branch with drug-eluting stents.  Her EF was noted to be normal.  She was discharged home on dual antiplatelet therapy with aspirin and Brilinta.  She did see Kerin Ransom back in the office shortly after but there was concern over medication compliance potentially related to communication.  She was seen by Dr. Alvester Chou in follow-up on 04/28/2018.  At this  appointment she reported doing well, but it was again noted that there was question of compliance with her antiplatelet regimen.  Shortly thereafter her daughter called and stating her Erika Bates was too costly and the plan was to switch her to Plavix with a load.  Daughter reports she thought she picked up the correct medications as she helps to feel her mother's pill containers.  Daughter reports that the patient complained of centralized chest pain yesterday morning, that was relieved with sublingual nitro.  Reports that she went to work and came by later checked on her mother in the evening and she was feeling well.  She had a recurrence of symptoms again this morning with chest pain and shortness of breath but she reports as being very similar to when she had to have her stents placed. Again improved with sublingual nitro.  Daughter became concerned at that time and ultimately brought her to the ER.  In the ED her labs showed stable electrolytes, creatinine 1.34, POC troponin negative x1, hemoglobin 12.5.  EKG showed sinus rhythm with q waves in lead III. She denies any pain at the time of assessment, but reported having return of chest pain while walking to the bathroom in the ED.   In talking with the daughter there has been a mix up with the medications. She thought her mother was getting her plavix, but actually only taking atorvastatin. Seems the patient has potentially been off antiplatelet therapy for a month.  Home Medications    Prior to Admission medications   Medication Sig Start Date End Date Taking? Authorizing Provider  aspirin EC 81 MG EC tablet Take 1 tablet (81 mg total) by mouth daily. 02/17/18   [facility], Tami Lin, PA  atorvastatin (LIPITOR) 80 MG tablet Take 1 tablet (80 mg total) by mouth daily at 6 PM. 02/17/18   [facility], Tami Lin, PA  blood glucose meter kit and supplies KIT Dispense based on patient and insurance preference. Use up to four times daily as directed. (FOR  ICD-9 250.00, 250.01). 03/17/18   Clent Demark, PA-C  carvedilol (COREG) 3.125 MG tablet Take 1 tablet (3.125 mg total) by mouth 2 (two) times daily with a meal. 02/17/18   [facility], Tami Lin, PA  clopidogrel (PLAVIX) 75 MG tablet Take 1 tablet (75 mg total) by mouth daily. FOR THE FIRST DAY OF THE FIRST BOTTLE TAKE 4 TABLETS THAT DAY 05/03/18   Lorretta Harp, MD  glimepiride (AMARYL) 2 MG tablet Take 1 tablet (2 mg total) by mouth daily before breakfast. 03/17/18   Clent Demark, PA-C  nitroGLYCERIN (NITROSTAT) 0.4 MG SL tablet Place 1 tablet (0.4 mg total) under the tongue every 5 (five) minutes as needed for chest pain. 02/17/18   [facility], Tami Lin, PA    Family History    Family History  Problem Relation Age of Onset  . Hypertension Father     Social History    Social History   Socioeconomic History  . Marital status: Significant Other    Spouse name: Not on file  . Number of children: Not on file  . Years of education: Not on file  . Highest education level: Not on file  Occupational History  . Not on file  Social Needs  . Financial resource strain: Not on file  . Food insecurity:    Worry: Not on file    Inability: Not on file  . Transportation needs:    Medical: Not on file    Non-medical: Not on file  Tobacco Use  . Smoking status: Never Smoker  . Smokeless tobacco: Never Used  Substance and Sexual Activity  . Alcohol use: No  . Drug use: No  . Sexual activity: Not on file  Lifestyle  . Physical activity:    Days per week: Not on file    Minutes per session: Not on file  . Stress: Not on file  Relationships  . Social connections:    Talks on phone: Not on file    Gets together: Not on file    Attends religious service: Not on file    Active member of club or organization: Not on file    Attends meetings of clubs or organizations: Not on file    Relationship status: Not on file  . Intimate partner violence:    Fear of current or ex partner:  Not on file    Emotionally abused: Not on file    Physically abused: Not on file    Forced sexual activity: Not on file  Other Topics Concern  . Not on file  Social History Narrative  . Not on file     Review of Systems    See HPI  All other systems reviewed and are otherwise negative except as noted above.  Physical Exam    Blood pressure (!) 122/108, pulse (!) 59, temperature 98.7 F (37.1 C), temperature source Oral, resp. rate 13, height 5' 3"  (1.6 m), weight 89.4 kg, SpO2  98 %.  General: Pleasant, older female NAD Psych: Normal affect. Neuro: Alert and oriented X 3. Moves all extremities spontaneously. HEENT: Normal  Neck: Supple, no JVD. Lungs:  Resp regular and unlabored, CTA. Heart: RRR no murmurs. Abdomen: Soft, non-tender, non-distended, BS + x 4.  Extremities: No clubbing, cyanosis or edema. DP/PT/Radials 2+ and equal bilaterally.  Labs    Troponin [facility] of Care Test) Recent Labs    07/29/18 1202  TROPIPOC 0.00   No results for input(s): CKTOTAL, CKMB, TROPONINI in the last 72 hours. Lab Results  Component Value Date   WBC 8.1 07/29/2018   HGB 12.5 07/29/2018   HCT 40.1 07/29/2018   MCV 85.7 07/29/2018   PLT 270 07/29/2018    Recent Labs  Lab 07/29/18 1157  NA 135  K 3.8  CL 102  CO2 25  BUN 15  CREATININE 1.34*  CALCIUM 8.9  GLUCOSE 207*   Lab Results  Component Value Date   CHOL 107 04/28/2018   HDL 33 (L) 04/28/2018   LDLCALC 53 04/28/2018   TRIG 103 04/28/2018   Lab Results  Component Value Date   DDIMER 0.35 02/15/2018     Radiology Studies    No results Bates.  ECG & Cardiac Imaging    EKG: SR with q waves in lead III  Echo: 02/16/18   Study Conclusions  - Left ventricle: The cavity size was normal. Wall thickness was   normal. Systolic function was normal. The estimated ejection   fraction was in the range of 55% to 60%. Wall motion was normal;   there were no regional wall motion abnormalities. Doppler    parameters are consistent with abnormal left ventricular   relaxation (grade 1 diastolic dysfunction).  Cath: 02/16/18   CULPRIT LESION: Ost Ramus to Ramus lesion is 95% stenosed.  A drug-eluting stent was successfully placed using a STENT SYNERGY DES 2.25X12. - post-dilated to 2.4 mm  Post intervention, there is a 0% residual stenosis.  LESION #2: Prox LAD lesion is 75% stenosed.  A drug-eluting stent was successfully placed using a STENT SYNERGY DES 2.75X24. - post-dilated to 3.1 mm  Post intervention, there is a 0% residual stenosis.  _________________________________________________________________  Colon Flattery RCA to Prox RCA lesion is 55% stenosed.  Prox Cx to Mid Cx lesion is 75% stenosed - followed by Colon Flattery 2nd Mrg CTO 100% stenosed - fills via RPL-OM collaterals  The left ventricular systolic function is "low" normal. The left ventricular ejection fraction is 50-55% by visual estimate. --Mid to distal anterolateral hypokinesis  LV end diastolic pressure is normal.   Successful DES PCI of the proximal Ramus Intermedius and proximal LAD. Medical management of mid circumflex CTO as well as ostial RCA 50 to 60%.. Anterolateral hypokinesis on LV gram but otherwise preserved EF.   Plan:   Transfer to 6 Central post procedure unit for TR band removal and overnight monitoring.  Nitroglycerin was weaned off  Dual antiplatelet therapy for minimum 1 year  Continue aggressive risk factor modification with blood pressure, glycemic and lipid management  Glenetta Hew, M.D., M.S. Interventional Cardiologist   Assessment & Plan    67 yo female with PMH of CAD s/p LAD/ramus branch stenting (5/19), HTN, HL and DM who presented with chest pain.   1. Chest pain: Reports onset yesterday that was relieved with SL nitro, and another episode this morning. Very similar to what she experienced with previous stents this year. Seems there has been a mix up with her medications  at home and she  has not been on antiplatelet therapy for about a month. She is within the window for ISR as her cath was back in May.  -- The patient understands that risks included but are not limited to stroke (1 in 1000), death (1 in 1000), kidney failure [usually temporary] (1 in 500), bleeding (1 in 200), allergic reaction [possibly serious] (1 in 200).  -- will start IV heparin -- plan for cardiac cath to reassess stent patency  2. HTN: stable with current therapy  3. 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 SSI while inpatient.   Severity of Illness: The appropriate patient status for this patient is OBSERVATION. Observation status is judged to be reasonable and necessary in order to provide the required intensity of service to ensure the patient's safety. The patient's presenting symptoms, physical exam findings, and initial radiographic and laboratory data in the context of their medical condition is felt to place them at decreased risk for further clinical deterioration. Furthermore, it is anticipated that the patient will be medically stable for discharge from the hospital within 2 midnights of admission. The following factors support the patient status of observation.   " The patient's presenting symptoms include chest pain. " The physical exam findings include normal exam. " The initial radiographic and laboratory data are labs stable.   Barnet Pall, NP-C Pager [phone] 07/29/2018, 1:59 PM

## 2018-07-29 NOTE — ED Provider Notes (Signed)
[facility] EMERGENCY DEPARTMENT Provider Note   CSN: [id number] Arrival date & time: 07/29/18  1111     History   Chief Complaint Chief Complaint  Patient presents with  . Chest Pain    HPI Monserath Neff is a 67 y.o. female.  HPI   67 year old female with chest pain.  There is a language barrier but daughter is at bedside translating.  Patient declines Oceanographer.  She has been having intermittent chest pain since yesterday.  Describes a burning sensation which spreads out over her anterior chest.  She had 2 episodes yesterday night at 9 and 10 PM and another morning around 9 AM this morning.  She was at rest during symptom onset.  Associated dyspnea.  Has a history of CAD s/p stenting after non-STEMI in May.  She reports similar symptoms then as she has been having the past 2 days.  Her episodes have been relieved by nitroglycerin.  Her cardiologist is Dr. Quay Burow.  Past Medical History:  Diagnosis Date  . Dialysis patient [facility]) [phone]  . Hypertension   . Myocardial infarction ([place]) 01/2018  . Pre-diabetes     Patient Active Problem List   Diagnosis Date Noted  . CAD S/P percutaneous coronary angioplasty 02/26/2018  . Type 2 diabetes mellitus without complication, without long-term current use of insulin ([place]) 02/17/2018  . Hyperlipidemia LDL goal <70 02/17/2018  . Hypertension, essential 02/17/2018  . Non-ST elevation (NSTEMI) myocardial infarction ([place]) 02/16/2018  . Unstable angina ([place]) 02/15/2018  . History of kidney problems 07/22/2017    Past Surgical History:  Procedure Laterality Date  . ABDOMINAL EXPLORATION SURGERY  >1990   "they thought it was problems w/my gallbladder; said it was my pancreas but they didn't take that out" (02/16/2018)  . CORONARY STENT INTERVENTION N/A 02/16/2018   Procedure: CORONARY STENT INTERVENTION;  Surgeon: Leonie Man, MD;  Location: [place] CV LAB;  Service: Cardiovascular;   Laterality: N/A;  . LEFT HEART CATH AND CORONARY ANGIOGRAPHY N/A 02/16/2018   Procedure: LEFT HEART CATH AND CORONARY ANGIOGRAPHY;  Surgeon: Leonie Man, MD;  Location: [place] CV LAB;  Service: Cardiovascular;  Laterality: N/A;  . TUBAL LIGATION       OB History   None      Home Medications    Prior to Admission medications   Medication Sig Start Date End Date Taking? Authorizing Provider  aspirin EC 81 MG EC tablet Take 1 tablet (81 mg total) by mouth daily. 02/17/18   Duke, Tami Lin, PA  atorvastatin (LIPITOR) 80 MG tablet Take 1 tablet (80 mg total) by mouth daily at 6 PM. 02/17/18   Duke, Tami Lin, PA  blood glucose meter kit and supplies KIT Dispense based on patient and insurance preference. Use up to four times daily as directed. (FOR ICD-9 250.00, 250.01). 03/17/18   Clent Demark, PA-C  carvedilol (COREG) 3.125 MG tablet Take 1 tablet (3.125 mg total) by mouth 2 (two) times daily with a meal. 02/17/18   Duke, Tami Lin, PA  clopidogrel (PLAVIX) 75 MG tablet Take 1 tablet (75 mg total) by mouth daily. FOR THE FIRST DAY OF THE FIRST BOTTLE TAKE 4 TABLETS THAT DAY 05/03/18   Lorretta Harp, MD  glimepiride (AMARYL) 2 MG tablet Take 1 tablet (2 mg total) by mouth daily before breakfast. 03/17/18   Clent Demark, PA-C  nitroGLYCERIN (NITROSTAT) 0.4 MG SL tablet Place 1 tablet (0.4 mg total) under the tongue every  5 (five) minutes as needed for chest pain. 02/17/18   Duke, Tami Lin, PA    Family History Family History  Problem Relation Age of Onset  . Hypertension Father     Social History Social History   Tobacco Use  . Smoking status: Never Smoker  . Smokeless tobacco: Never Used  Substance Use Topics  . Alcohol use: No  . Drug use: No     Allergies   Patient has no known allergies.   Review of Systems Review of Systems  All systems reviewed and negative, other than as noted in HPI.  Physical Exam Updated Vital Signs BP  125/67 (BP Location: Right Arm)   Pulse 70   Temp 98.7 F (37.1 C) (Oral)   Resp 16   Ht 5' 3"  (1.6 m)   Wt 89.4 kg   SpO2 97%   BMI 34.90 kg/m   Physical Exam  Constitutional: She appears well-developed and well-nourished. No distress.  HENT:  Head: Normocephalic and atraumatic.  Eyes: Conjunctivae are normal. Right eye exhibits no discharge. Left eye exhibits no discharge.  Neck: Neck supple.  Cardiovascular: Normal rate, regular rhythm and normal heart sounds. Exam reveals no gallop and no friction rub.  No murmur heard. Pulmonary/Chest: Effort normal and breath sounds normal. No respiratory distress.  Abdominal: Soft. She exhibits no distension. There is no tenderness.  Musculoskeletal: She exhibits no edema or tenderness.  Lower extremities symmetric as compared to each other. No calf tenderness. Negative Homan's. No palpable cords.   Neurological: She is alert.  Skin: Skin is warm and dry.  Psychiatric: She has a normal mood and affect. Her behavior is normal. Thought content normal.  Nursing note and vitals reviewed.    ED Treatments / Results  Labs (all labs ordered are listed, but only abnormal results are displayed) Labs Reviewed  BASIC METABOLIC PANEL - Abnormal; Notable for the following components:      Result Value   Glucose, Bld 207 (*)    Creatinine, Ser 1.34 (*)    GFR calc non Af Amer 41 (*)    GFR calc Af Amer 47 (*)    All other components within normal limits  CBC  I-STAT TROPONIN, ED    EKG EKG Interpretation  Date/Time:  Thursday July 29 2018 11:17:04 EDT Ventricular Rate:  76 PR Interval:  174 QRS Duration: 74 QT Interval:  348 QTC Calculation: 391 R Axis:   16 Text Interpretation:  Normal sinus rhythm Cannot rule out Anterior infarct , age undetermined Abnormal ECG Confirmed by Virgel Manifold [phone]) on 07/29/2018 12:51:11 PM   Radiology No results found.  Procedures Procedures (including critical care time)  Medications  Ordered in ED Medications  nitroGLYCERIN (NITROSTAT) SL tablet 0.4 mg (0.4 mg Sublingual Given 07/30/18 0920)  atorvastatin (LIPITOR) tablet 80 mg (80 mg Oral Given 07/29/18 2003)  carvedilol (COREG) tablet 3.125 mg (3.125 mg Oral Given 07/30/18 0900)  insulin aspart (novoLOG) injection 0-15 Units (0 Units Subcutaneous Not Given 07/30/18 1210)  acetaminophen (TYLENOL) tablet 650 mg (650 mg Oral Given 07/30/18 1437)  ondansetron (ZOFRAN) injection 4 mg (4 mg Intravenous Given 07/29/18 2154)  0.9 %  sodium chloride infusion ( Intravenous Stopped 07/30/18 0128)  sodium chloride flush (NS) 0.9 % injection 3 mL (3 mLs Intravenous Given 07/30/18 0947)  sodium chloride flush (NS) 0.9 % injection 3 mL (has no administration in time range)  0.9 %  sodium chloride infusion (has no administration in time range)  morphine 2 MG/ML  injection 2 mg (2 mg Intravenous Given 07/29/18 1948)  aspirin chewable tablet 81 mg (81 mg Oral Given 07/30/18 0947)  isosorbide mononitrate (IMDUR) 24 hr tablet 30 mg (30 mg Oral Not Given 07/30/18 0948)  heparin ADULT infusion 100 units/mL (25000 units/258m sodium chloride 0.45%) (850 Units/hr Intravenous New Bag/Given 07/30/18 0941)  nitroGLYCERIN 50 mg in dextrose 5 % 250 mL (0.2 mg/mL) infusion (10 mcg/min Intravenous New Bag/Given 07/30/18 0920)  pantoprazole (PROTONIX) EC tablet 40 mg (40 mg Oral Given 07/30/18 1300)  perflutren lipid microspheres (DEFINITY) IV suspension (has no administration in time range)  aspirin chewable tablet 324 mg (324 mg Oral Given 07/29/18 1330)  famotidine (PEPCID) IVPB 20 mg premix (20 mg Intravenous New Bag/Given 07/29/18 1710)  nitroGLYCERIN 0.2 mg/mL in dextrose 5 % infusion (50 mg  New Bag/Given 07/30/18 0930)     Initial Impression / Assessment and Plan / ED Course  I have reviewed the triage vital signs and the nursing notes.  Pertinent labs & imaging results that were available during my care of the patient were reviewed by me and  considered in my medical decision making (see chart for details).     67year old female with chest pain.  Currently chest pain-free.  Known CAD and reports that recent symptoms reminiscent of symptoms she was having during MI in May.  EKG is abnormal but improved from most recent for comparison.  Initial troponin is normal.  Aspirin given.  Will discuss with cardiology . Final Clinical Impressions(s) / ED Diagnoses   Final diagnoses:  Chest pain, unspecified type    ED Discharge Orders    None       KVirgel Manifold MD 07/30/18 1511

## 2018-07-29 NOTE — Progress Notes (Signed)
TR BAND REMOVAL  LOCATION:    right radial  DEFLATED PER PROTOCOL:    Yes.    TIME BAND OFF / DRESSING APPLIED:    2130   SITE UPON ARRIVAL:    Level 0  SITE AFTER BAND REMOVAL:    Level 0  CIRCULATION SENSATION AND MOVEMENT:    Within Normal Limits   Yes.    COMMENTS:   Post TR Band instructions given, DSD applied, good cap refill

## 2018-07-29 NOTE — Progress Notes (Signed)
Paged by nurse regarding R upper arm swelling, R bicep feels hard. Still has sensation in distal arm. Surrounding tissue soft, no significant ecchymosis. Does not appears to have reached compartment syndrome, but definitely concerned of artery bleeding during cath esp since the opposite arm is normal and R arm swelling is new today per patient. Does feel painful per patient. Informed patient to let us know if pain significantly worsens.   Will obtain R upper arm vascular doppler to check for large hematoma. Will ask MD to check as well.  Ramond Dial PA Pager: [phone]

## 2018-07-29 NOTE — Progress Notes (Signed)
ANTICOAGULATION CONSULT NOTE - Initial Consult  Pharmacy Consult for heparin Indication: chest pain/ACS  No Known Allergies  Patient Measurements: Height: 5\' 3"  (160 cm) Weight: 197 lb (89.4 kg) IBW/kg (Calculated) : 52.4 Heparin Dosing Weight: 72.7 kg  Vital Signs: Temp: 98.7 F (37.1 C) (10/31 1123) Temp Source: Oral (10/31 1123) BP: 106/81 (10/31 1415) Pulse Rate: 54 (10/31 1415)  Labs: Recent Labs    07/29/18 1157  HGB 12.5  HCT 40.1  PLT 270  CREATININE 1.34*    Estimated Creatinine Clearance: 44.4 mL/min (A) (by C-G formula based on SCr of 1.34 mg/dL (H)).   Medical History: Past Medical History:  Diagnosis Date  . Dialysis patient [facility]) [phone]  . Hypertension   . Myocardial infarction ([facility]) 01/2018  . Pre-diabetes      Assessment: Pt is a 44 YOF presenting with ACS/chest pain. PMH is significant for CAD s/p LAD/ramus branch stenting (5/19), HTN, HLD, DM. No PTA anticoagulation. Hgb - 12.5, Plt - 270  Goal of Therapy:  Heparin level 0.3-0.7 units/ml Monitor platelets by anticoagulation protocol: Yes   Plan:  Heparin 4000 unit bolus x1, then heparin gtt 850 units/hr 6-hour heparin level at 2230 Daily heparin level and CBC  Erika Bates 07/29/2018,3:48 PM

## 2018-07-29 NOTE — ED Notes (Signed)
Pt transported to cath lab.  

## 2018-07-29 NOTE — ED Triage Notes (Signed)
Pt presents for evaluation of chest pain. States around 9 am yesterday developed chest pain that was resolved by 2 nitro. Daughter states pt developed cp around the same time this morning and was again resolved by 2 nitro. States takes ASA at home. Pt had MI in June with 2 stent placements.

## 2018-07-29 NOTE — ED Notes (Signed)
Patient transported to X-ray 

## 2018-07-29 NOTE — Progress Notes (Signed)
Seen and examined by Dr.  Royann Shivers and PA Azalee Course. Hold Heparin drip for now per Dr. Royann Shivers.

## 2018-07-29 NOTE — Progress Notes (Signed)
ANTICOAGULATION CONSULT NOTE - Follow-up Consult  Pharmacy Consult for heparin Indication: chest pain/ACS  No Known Allergies  Patient Measurements: Height: 5\' 3"  (160 cm) Weight: 197 lb (89.4 kg) IBW/kg (Calculated) : 52.4 Heparin Dosing Weight: 72.7 kg  Vital Signs: Temp: 98.7 F (37.1 C) (10/31 1123) Temp Source: Oral (10/31 1123) BP: 126/73 (10/31 1717) Pulse Rate: 67 (10/31 1717)  Labs: Recent Labs    07/29/18 1157  HGB 12.5  HCT 40.1  PLT 270  CREATININE 1.34*    Estimated Creatinine Clearance: 44.4 mL/min (A) (by C-G formula based on SCr of 1.34 mg/dL (H)).   Medical History: Past Medical History:  Diagnosis Date  . Dialysis patient [facility]) [phone]  . Hypertension   . Myocardial infarction ([facility]) 01/2018  . Pre-diabetes      Assessment: Pt is a 5 YOF who presented with CP. S/p cath 10/31 which showed CAD. TCTS consulted. Noted pt on plavix so would need plavix washout if surgery is decided on.  Pharmacy consulted to restart heparin 4 hours post sheath removal. Sheath was removed 1740. TR band off ~2130.  Goal of Therapy:  Heparin level 0.3-0.7 units/ml Monitor platelets by anticoagulation protocol: Yes   Plan:  At 2200 (~4 hr post sheath removal), start heparin 850 units/hr. No bolus Will f/u 6 hr heparin level Daily heparin level and CBC F/u TCTS consult  Christoper Fabian, PharmD, BCPS Clinical pharmacist  **Pharmacist phone directory can now be found on [email or web address] ([email or web address]).  Listed under [facility]. 07/29/2018,8:07 PM

## 2018-07-29 NOTE — Progress Notes (Addendum)
Patient c/o of right upper arm pain, right upper arm is swollen and hard.  Notified [place], [place] for [facility], will continue to monitor

## 2018-07-30 ENCOUNTER — Other Ambulatory Visit: Payer: Self-pay

## 2018-07-30 ENCOUNTER — Inpatient Hospital Stay (HOSPITAL_COMMUNITY): Payer: Medicaid Other

## 2018-07-30 ENCOUNTER — Encounter (HOSPITAL_COMMUNITY): Payer: Self-pay | Admitting: Cardiovascular Disease

## 2018-07-30 ENCOUNTER — Other Ambulatory Visit: Payer: Self-pay | Admitting: *Deleted

## 2018-07-30 DIAGNOSIS — I2510 Atherosclerotic heart disease of native coronary artery without angina pectoris: Secondary | ICD-10-CM

## 2018-07-30 DIAGNOSIS — E119 Type 2 diabetes mellitus without complications: Secondary | ICD-10-CM

## 2018-07-30 DIAGNOSIS — I25110 Atherosclerotic heart disease of native coronary artery with unstable angina pectoris: Secondary | ICD-10-CM

## 2018-07-30 DIAGNOSIS — M7989 Other specified soft tissue disorders: Secondary | ICD-10-CM

## 2018-07-30 DIAGNOSIS — I10 Essential (primary) hypertension: Secondary | ICD-10-CM

## 2018-07-30 DIAGNOSIS — I5030 Unspecified diastolic (congestive) heart failure: Secondary | ICD-10-CM

## 2018-07-30 LAB — BASIC METABOLIC PANEL
ANION GAP: 5 (ref 5–15)
BUN: 15 mg/dL (ref 8–23)
CHLORIDE: 109 mmol/L (ref 98–111)
CO2: 23 mmol/L (ref 22–32)
Calcium: 8.2 mg/dL — ABNORMAL LOW (ref 8.9–10.3)
Creatinine, Ser: 1.31 mg/dL — ABNORMAL HIGH (ref 0.44–1.00)
GFR, EST AFRICAN AMERICAN: 48 mL/min — AB (ref >60)
GFR, EST NON AFRICAN AMERICAN: 42 mL/min — AB (ref >60)
Glucose, Bld: 144 mg/dL — ABNORMAL HIGH (ref 70–99)
Potassium: 3.8 mmol/L (ref 3.5–5.1)
SODIUM: 137 mmol/L (ref 135–145)

## 2018-07-30 LAB — LIPID PANEL
CHOL/HDL RATIO: 3.5 ratio
CHOLESTEROL: 91 mg/dL (ref 0–200)
HDL: 26 mg/dL — AB (ref >40)
LDL Cholesterol: 38 mg/dL (ref 0–99)
TRIGLYCERIDES: 134 mg/dL (ref <150)
VLDL: 27 mg/dL (ref 0–40)

## 2018-07-30 LAB — CBC
HCT: 33.7 % — ABNORMAL LOW (ref 36.0–46.0)
HEMOGLOBIN: 10.8 g/dL — AB (ref 12.0–15.0)
MCH: 27.1 pg (ref 26.0–34.0)
MCHC: 32 g/dL (ref 30.0–36.0)
MCV: 84.5 fL (ref 80.0–100.0)
NRBC: 0 % (ref 0.0–0.2)
Platelets: 244 10*3/uL (ref 150–400)
RBC: 3.99 MIL/uL (ref 3.87–5.11)
RDW: 12.8 % (ref 11.5–15.5)
WBC: 7.4 10*3/uL (ref 4.0–10.5)

## 2018-07-30 LAB — PULMONARY FUNCTION TEST
FEF 25-75 Pre: 1.54 L/sec
FEF2575-%Pred-Pre: 74 %
FEV1-%Pred-Pre: 61 %
FEV1-Pre: 1.43 L
FEV1FVC-%Pred-Pre: 105 %
FEV6-%Pred-Pre: 59 %
FEV6-Pre: 1.73 L
FEV6FVC-%Pred-Pre: 104 %
FVC-%Pred-Pre: 57 %
FVC-Pre: 1.76 L
Pre FEV1/FVC ratio: 81 %
Pre FEV6/FVC Ratio: 100 %

## 2018-07-30 LAB — POCT ACTIVATED CLOTTING TIME: Activated Clotting Time: 510 seconds

## 2018-07-30 LAB — ECHOCARDIOGRAM COMPLETE
Height: 63 in
Weight: 3128 oz

## 2018-07-30 LAB — GLUCOSE, CAPILLARY
Glucose-Capillary: 102 mg/dL — ABNORMAL HIGH (ref 70–99)
Glucose-Capillary: 120 mg/dL — ABNORMAL HIGH (ref 70–99)
Glucose-Capillary: 140 mg/dL — ABNORMAL HIGH (ref 70–99)
Glucose-Capillary: 139 mg/dL — ABNORMAL HIGH (ref 70–99)

## 2018-07-30 LAB — HEPARIN LEVEL (UNFRACTIONATED): Heparin Unfractionated: 0.43 IU/mL (ref 0.30–0.70)

## 2018-07-30 LAB — TROPONIN I
Troponin I: 0.07 ng/mL (ref <0.03)
Troponin I: 0.1 ng/mL (ref <0.03)

## 2018-07-30 LAB — PLATELET INHIBITION P2Y12: Platelet Function  P2Y12: 305 [PRU] (ref 194–418)

## 2018-07-30 MED ORDER — PLASMA-LYTE 148 IV SOLN
INTRAVENOUS | Status: AC
  Administered 2018-08-02: 500 mL
  Filled 2018-07-30: qty 2.5

## 2018-07-30 MED ORDER — PHENYLEPHRINE HCL-NACL 20-0.9 MG/250ML-% IV SOLN
30.0000 ug/min | INTRAVENOUS | Status: DC
  Filled 2018-07-30: qty 250

## 2018-07-30 MED ORDER — TRANEXAMIC ACID 1000 MG/10ML IV SOLN
1.5000 mg/kg/h | INTRAVENOUS | Status: DC
  Filled 2018-07-30: qty 25

## 2018-07-30 MED ORDER — TRANEXAMIC ACID (OHS) PUMP PRIME SOLUTION
2.0000 mg/kg | INTRAVENOUS | Status: DC
  Filled 2018-07-30: qty 1.77

## 2018-07-30 MED ORDER — POTASSIUM CHLORIDE 2 MEQ/ML IV SOLN
80.0000 meq | INTRAVENOUS | Status: DC
  Filled 2018-07-30: qty 40

## 2018-07-30 MED ORDER — MILRINONE LACTATE IN DEXTROSE 20-5 MG/100ML-% IV SOLN
0.3000 ug/kg/min | INTRAVENOUS | Status: AC
  Administered 2018-08-02: 0.3 ug/kg/min via INTRAVENOUS
  Filled 2018-07-30: qty 100

## 2018-07-30 MED ORDER — PERFLUTREN LIPID MICROSPHERE
1.0000 mL | INTRAVENOUS | Status: AC | PRN
  Administered 2018-07-30: 2 mL via INTRAVENOUS
  Filled 2018-07-30: qty 10

## 2018-07-30 MED ORDER — DOPAMINE-DEXTROSE 3.2-5 MG/ML-% IV SOLN
0.0000 ug/kg/min | INTRAVENOUS | Status: AC
  Administered 2018-08-02: 3 ug/kg/min via INTRAVENOUS
  Filled 2018-07-30: qty 250

## 2018-07-30 MED ORDER — EPINEPHRINE PF 1 MG/ML IJ SOLN
0.0000 ug/min | INTRAVENOUS | Status: DC
  Filled 2018-07-30: qty 4

## 2018-07-30 MED ORDER — PANTOPRAZOLE SODIUM 40 MG PO TBEC
40.0000 mg | DELAYED_RELEASE_TABLET | Freq: Every day | ORAL | Status: DC
  Administered 2018-07-30 – 2018-08-01 (×3): 40 mg via ORAL
  Filled 2018-07-30 (×3): qty 1

## 2018-07-30 MED ORDER — SODIUM CHLORIDE 0.9 % IV SOLN
750.0000 mg | INTRAVENOUS | Status: DC
  Filled 2018-07-30: qty 750

## 2018-07-30 MED ORDER — VANCOMYCIN HCL 10 G IV SOLR
1500.0000 mg | INTRAVENOUS | Status: DC
  Filled 2018-07-30: qty 1500

## 2018-07-30 MED ORDER — MAGNESIUM SULFATE 50 % IJ SOLN
40.0000 meq | INTRAMUSCULAR | Status: DC
  Filled 2018-07-30: qty 9.85

## 2018-07-30 MED ORDER — NITROGLYCERIN IN D5W 200-5 MCG/ML-% IV SOLN
0.0000 ug/min | INTRAVENOUS | Status: DC
  Administered 2018-07-30: 10 ug/min via INTRAVENOUS

## 2018-07-30 MED ORDER — NITROGLYCERIN IN D5W 200-5 MCG/ML-% IV SOLN
2.0000 ug/min | INTRAVENOUS | Status: DC
  Filled 2018-07-30: qty 250

## 2018-07-30 MED ORDER — SODIUM CHLORIDE 0.9 % IV SOLN
INTRAVENOUS | Status: DC
  Filled 2018-07-30: qty 30

## 2018-07-30 MED ORDER — NOREPINEPHRINE 4 MG/250ML-% IV SOLN
0.0000 ug/min | INTRAVENOUS | Status: DC
  Filled 2018-07-30: qty 250

## 2018-07-30 MED ORDER — NITROGLYCERIN IN D5W 200-5 MCG/ML-% IV SOLN
INTRAVENOUS | Status: AC
  Administered 2018-07-30: 50 mg
  Filled 2018-07-30: qty 250

## 2018-07-30 MED ORDER — INSULIN REGULAR(HUMAN) IN NACL 100-0.9 UT/100ML-% IV SOLN
INTRAVENOUS | Status: DC
  Filled 2018-07-30: qty 100

## 2018-07-30 MED ORDER — SODIUM CHLORIDE 0.9 % IV SOLN
1.5000 g | INTRAVENOUS | Status: AC
  Administered 2018-08-02: .75 g via INTRAVENOUS
  Administered 2018-08-02: 1.5 g via INTRAVENOUS
  Filled 2018-07-30: qty 1.5

## 2018-07-30 MED ORDER — TRANEXAMIC ACID (OHS) BOLUS VIA INFUSION
15.0000 mg/kg | INTRAVENOUS | Status: AC
  Administered 2018-08-02: 1330.5 mg via INTRAVENOUS
  Filled 2018-07-30: qty 1331

## 2018-07-30 MED ORDER — DEXMEDETOMIDINE HCL IN NACL 400 MCG/100ML IV SOLN
0.1000 ug/kg/h | INTRAVENOUS | Status: DC
  Filled 2018-07-30: qty 100

## 2018-07-30 NOTE — Progress Notes (Signed)
ANTICOAGULATION CONSULT NOTE - Follow-up Consult  Pharmacy Consult for heparin Indication: chest pain/ACS  No Known Allergies  Patient Measurements: Height: 5\' 3"  (160 cm) Weight: 195 lb 8 oz (88.7 kg) IBW/kg (Calculated) : 52.4 Heparin Dosing Weight: 72.7 kg  Vital Signs: Temp: 98.2 F (36.8 C) (11/01 0759) Temp Source: Oral (11/01 0759) BP: 104/51 (11/01 0759)  Labs: Recent Labs    07/29/18 1157 07/29/18 2138 07/30/18 0335 07/30/18 0943  HGB 12.5  --   --  10.8*  HCT 40.1  --   --  33.7*  PLT 270  --   --  244  CREATININE 1.34*  --   --   --   TROPONINI  --  <0.03 0.07*  --     Estimated Creatinine Clearance: 44.2 mL/min (A) (by C-G formula based on SCr of 1.34 mg/dL (H)).   Medical History: Past Medical History:  Diagnosis Date  . Dialysis patient [facility]) [phone]  . Hypertension   . Myocardial infarction ([facility]) 01/2018  . Pre-diabetes      Assessment: Pt is a 23 YOF who presented with CP. S/p cath 10/31 which showed CAD. Heparin was restarted post cath and TCTS consulted. Noted pt on plavix so would need plavix washout if surgery is decided on. Heparin was discontinued 10/31 due to R UE hematoma and was resumed 11/1 due to recurrent CP   Goal of Therapy:  Heparin level 0.3-0.7 units/ml Monitor platelets by anticoagulation protocol: Yes   Plan:  -Heparin was resumed at 850 units/hr -Heparin level in 8 hours and daily wth CBC daily  Harland German, PharmD Clinical Pharmacist **Pharmacist phone directory can now be found on [email or web address] ([email or web address]).  Listed under [facility].

## 2018-07-30 NOTE — Progress Notes (Signed)
ANTICOAGULATION CONSULT NOTE - Follow-up Consult  Pharmacy Consult for heparin Indication: chest pain/ACS  No Known Allergies  Patient Measurements: Height: 5\' 3"  (160 cm) Weight: 195 lb 8 oz (88.7 kg) IBW/kg (Calculated) : 52.4 Heparin Dosing Weight: 72.7 kg  Vital Signs: Temp: 98.3 F (36.8 C) (11/01 1928) Temp Source: Oral (11/01 1928) BP: 112/61 (11/01 1928) Pulse Rate: 71 (11/01 1928)  Labs: Recent Labs    07/29/18 1157 07/29/18 2138 07/30/18 0335 07/30/18 0943 07/30/18 1833  HGB 12.5  --   --  10.8*  --   HCT 40.1  --   --  33.7*  --   PLT 270  --   --  244  --   HEPARINUNFRC  --   --   --   --  0.43  CREATININE 1.34*  --   --  1.31*  --   TROPONINI  --  <0.03 0.07* 0.10*  --     Estimated Creatinine Clearance: 45.2 mL/min (A) (by C-G formula based on SCr of 1.31 mg/dL (H)).   Medical History: Past Medical History:  Diagnosis Date  . Dialysis patient [facility]) [phone]  . Hypertension   . Myocardial infarction ([facility]) 01/2018  . Pre-diabetes      Assessment: Pt is a 85 YOF who presented with CP. S/p cath 10/31 which showed CAD. Heparin was restarted post cath and TCTS consulted.  Heparin was discontinued 10/31 due to R UE hematoma and was resumed 11/1 due to recurrent CP Heparin drip 850 uts/hr HL 0.43 at goal Patient previously on clopidogrel - plan CABG after wash out - last dose 11/1 P2Y12 305 - shows low platelet inhibition  Goal of Therapy:  Heparin level 0.3-0.7 units/ml Monitor platelets by anticoagulation protocol: Yes   Plan:  -Continue Heparin at 850 units/hr -Heparin level daily wth CBC daily     Leota Sauers Pharm.D. CPP, BCPS Clinical Pharmacist [phone] 07/30/2018 7:43 PM

## 2018-07-30 NOTE — Care Management Note (Addendum)
Case Management Note  Patient Details  Name: Erika Bates MRN: [id number] Date of Birth: 08/21/1951  Subjective/Objective:  From home , presents with chest pain, s/p cath, will need surgery,  NCM spoke with daughter and patient, daughter states she thought the statin medication was patient's anticoagulant and was not taking the plavix that MD put patient on. Daughter states she had too much on her plate at the time.  NCM informed her about her follow up apts and medication ast at the [facility], daughter states she understands and she has been to both clinics. NCM asked if she has any questions, she states no she understands about her apts and discount medications at the clinic. Patient is now for surgery.                    Action/Plan: NCM will follow for transition of care needs.   Expected Discharge Date:  07/30/18               Expected Discharge Plan:  Home/Self Care  In-House Referral:     Discharge planning Services  CM Consult, Medication Assistance, Follow-up appt scheduled, [facility]  Post Acute Care Choice:    Choice offered to:     DME Arranged:    DME Agency:     HH Arranged:    HH Agency:     Status of Service:  In process, will continue to follow  If discussed at Long Length of Stay Meetings, dates discussed:    Additional Comments:  Leone Haven, RN 07/30/2018, 9:13 AM

## 2018-07-30 NOTE — Progress Notes (Signed)
RUE arterial duplex prelim:  Patent with no evidence of obstruction.  Farrel Demark, RDMS, RVT

## 2018-07-30 NOTE — Consult Note (Addendum)
[address]       [place] [address]             [phone]        Amil Mcaleer  Medical Record #[id number] Date of Birth: October 08, 1950  Referring: Dr Gwenlyn Found Primary Care: Patient, No Pcp Per Primary Cardiologist:Jonathan Gwenlyn Found, MD  Chief Complaint:    Chief Complaint  Patient presents with  . Chest Pain    History of Present Illness:      Ms. Buccieri is a 67 year old [place] female with a past medical history significant for hypertension, hyperlipidemia, type 2 diabetes, and previous CAD status post and STEMI with PCI done on 01/2018 to the proximal LAD and ramus who presents to [facility] emergency department with chest pain. Aug 2 patient was unable to afford Brelinta , was to be loaded with Plavix but did not happen and patient went off  Anti platelet therpy.   She had been having chest pain on and off for years and would take nitroglycerin.  2 days ago she took nitroglycerin and an hour later had to take another nitroglycerin tab, therefore her family drove her to the emergency department.  She ultimately underwent cardiac catheterization which showed an ostial ramus that was 95% stenosed, proximal LAD which was 70% stenosed, ostial left main which was 70% stenosed, and a mid to distal circumflex lesion which was 100% stenosed.  The patient does not have any past medical history of stroke.  We have been consulted for possible revascularization.  She is currently not having any chest pain, however she is describing reflux symptoms.  Patient has history of renal insufficiency, was on dialysis for 8-12 months in the late 1990's, details of cause not known.   Current Activity/ Functional Status: Patient was independent with mobility/ambulation, transfers, ADL's, IADL's.   Zubrod Score: At the time of surgery this patient's most appropriate activity status/level should be described as: []     0    Normal activity, no symptoms [x]     1    Restricted  in physical strenuous activity but ambulatory, able to do out light work []     2    Ambulatory and capable of self care, unable to do work activities, up and about                 more than 50%  Of the time                            []     3    Only limited self care, in bed greater than 50% of waking hours []     4    Completely disabled, no self care, confined to bed or chair []     5    Moribund  Past Medical History:  Diagnosis Date  . Dialysis patient [facility]) [phone]  . Hypertension   . Myocardial infarction ([place]) 01/2018  . Pre-diabetes     Past Surgical History:  Procedure Laterality Date  . ABDOMINAL EXPLORATION SURGERY  >1990   "they thought it was problems w/my gallbladder; said it was my pancreas but they didn't take that out" (02/16/2018)  . CARDIAC CATHETERIZATION  07/29/2018  . CORONARY STENT INTERVENTION N/A 02/16/2018   Procedure: CORONARY STENT INTERVENTION;  Surgeon: Leonie Man, MD;  Location: [facility];  Service: Cardiovascular;  Laterality: N/A;  . LEFT HEART CATH AND CORONARY ANGIOGRAPHY N/A  02/16/2018   Procedure: LEFT HEART CATH AND CORONARY ANGIOGRAPHY;  Surgeon: Leonie Man, MD;  Location: [facility];  Service: Cardiovascular;  Laterality: N/A;  . LEFT HEART CATH AND CORONARY ANGIOGRAPHY N/A 07/29/2018   Procedure: LEFT HEART CATH AND CORONARY ANGIOGRAPHY;  Surgeon: Lorretta Harp, MD;  Location: [facility];  Service: Cardiovascular;  Laterality: N/A;  . TUBAL LIGATION      Social History   Tobacco Use  Smoking Status Never Smoker  Smokeless Tobacco Never Used    Social History   Substance and Sexual Activity  Alcohol Use No     No Known Allergies  Current Facility-Administered Medications  Medication Dose Route Frequency Provider Last Rate Last Dose  . 0.9 %  sodium chloride infusion  250 mL Intravenous PRN Lorretta Harp, MD      . acetaminophen (TYLENOL) tablet 650 mg  650 mg Oral Q4H PRN Lorretta Harp,  MD   650 mg at 07/30/18 0056  . aspirin chewable tablet 81 mg  81 mg Oral Daily Lorretta Harp, MD   81 mg at 07/30/18 0947  . atorvastatin (LIPITOR) tablet 80 mg  80 mg Oral q1800 Reino Bellis B, NP   80 mg at 07/29/18 2003  . carvedilol (COREG) tablet 3.125 mg  3.125 mg Oral BID WC Reino Bellis B, NP   3.125 mg at 07/30/18 0900  . clopidogrel (PLAVIX) tablet 75 mg  75 mg Oral Daily Reino Bellis B, NP   75 mg at 07/30/18 0947  . heparin ADULT infusion 100 units/mL (25000 units/261m sodium chloride 0.45%)  850 Units/hr Intravenous Continuous MKris Mouton RPH 8.5 mL/hr at 07/30/18 0941 850 Units/hr at 07/30/18 0941  . insulin aspart (novoLOG) injection 0-15 Units  0-15 Units Subcutaneous TID WC RReino BellisB, NP      . isosorbide mononitrate (IMDUR) 24 hr tablet 30 mg  30 mg Oral Daily MParis HJaguas PUtah  30 mg at 07/29/18 2003  . morphine 2 MG/ML injection 2 mg  2 mg Intravenous Q2H PRN BLorretta Harp MD   2 mg at 07/29/18 1948  . nitroGLYCERIN (NITROSTAT) SL tablet 0.4 mg  0.4 mg Sublingual Q5 Min x 3 PRN RReino BellisB, NP   0.4 mg at 07/30/18 0920  . nitroGLYCERIN 50 mg in dextrose 5 % 250 mL (0.2 mg/mL) infusion  0-200 mcg/min Intravenous Titrated HDaune Perch NP 3 mL/hr at 07/30/18 0920 10 mcg/min at 07/30/18 0920  . ondansetron (ZOFRAN) injection 4 mg  4 mg Intravenous Q6H PRN BLorretta Harp MD   4 mg at 07/29/18 2154  . sodium chloride flush (NS) 0.9 % injection 3 mL  3 mL Intravenous Q12H BLorretta Harp MD   3 mL at 07/30/18 0947  . sodium chloride flush (NS) 0.9 % injection 3 mL  3 mL Intravenous PRN BLorretta Harp MD        Medications Prior to Admission  Medication Sig Dispense Refill Last Dose  . aspirin EC 81 MG EC tablet Take 1 tablet (81 mg total) by mouth daily. 30 tablet 11 07/29/2018 at Unknown time  . atorvastatin (LIPITOR) 80 MG tablet Take 1 tablet (80 mg total) by mouth daily at 6 PM. 30 tablet 11 07/28/2018 at Unknown time  .  carvedilol (COREG) 3.125 MG tablet Take 1 tablet (3.125 mg total) by mouth 2 (two) times daily with a meal. 60 tablet 11 07/29/2018 at 0800  . glimepiride (AMARYL)  2 MG tablet Take 1 tablet (2 mg total) by mouth daily before breakfast. 30 tablet 5 07/29/2018 at Unknown time  . nitroGLYCERIN (NITROSTAT) 0.4 MG SL tablet Place 1 tablet (0.4 mg total) under the tongue every 5 (five) minutes as needed for chest pain. 25 tablet 1 unk  . blood glucose meter kit and supplies KIT Dispense based on patient and insurance preference. Use up to four times daily as directed. (FOR ICD-9 250.00, 250.01). 1 each 0 Taking  . clopidogrel (PLAVIX) 75 MG tablet Take 1 tablet (75 mg total) by mouth daily. FOR THE FIRST DAY OF THE FIRST BOTTLE TAKE 4 TABLETS THAT DAY (Patient not taking: Reported on 07/29/2018) 34 tablet 11 Not Taking at Unknown time    Family History  Problem Relation Age of Onset  . Hypertension Father      Review of Systems:   Review of Systems  Constitutional: Positive for malaise/fatigue. Negative for chills and fever.  Respiratory: Positive for shortness of breath.   Cardiovascular: Positive for chest pain. Negative for leg swelling.  Gastrointestinal: Positive for heartburn. Negative for nausea and vomiting.   Pertinent items are noted in HPI.     Physical Exam: BP (!) 104/51 (BP Location: Left Arm)   Pulse 67   Temp 98.2 F (36.8 C) (Oral)   Resp 13   Ht 5' 3"  (1.6 m)   Wt 88.7 kg   SpO2 96%   BMI 34.63 kg/m    General appearance: alert, cooperative and no distress Resp: clear to auscultation bilaterally Cardio: regular rate and rhythm, S1, S2 normal, no murmur, click, rub or gallop GI: soft, non-tender; bowel sounds normal; no masses,  no organomegaly Extremities: extremities normal, atraumatic, no cyanosis or edema, good distal pulses and warm and well perfused extremities, hematoma,right upper arm following cath  Diagnostic Studies & Laboratory data:  Cardiac Cath  07/29/2018:   Previously placed Ramus stent (unknown type) is widely patent.  Ost Ramus lesion is 95% stenosed.  Prox LAD-1 lesion is 70% stenosed.  Previously placed Prox LAD-2 stent (unknown type) is widely patent.  Ost LM lesion is 70% stenosed.  Mid Cx to Dist Cx lesion is 100% stenosed.  The left ventricular systolic function is normal.  LV end diastolic pressure is normal.  The left ventricular ejection fraction is 55-65% by visual estimate.  Echocardiogram 07/2018  Transthoracic Echocardiography  Patient:    Linsi, Humann MR #:       [id number] Study Date: 07/30/2018 Gender:     F Age:        89 Height:     160 cm Weight:     88.7 kg BSA:        2.02 m^2 Pt. Status: Room:       6C03C   ADMITTING    Quay Burow, MD  ATTENDING    Quay Burow, MD  [place] MD  [place] MD  PERFORMING   [facility], Inpatient  SONOGRAPHER  Haroldine Laws  cc:  ------------------------------------------------------------------- LV EF: 60% -   65%  ------------------------------------------------------------------- History:   PMH:  CAD (coronary artery disease)  Chest pain. Coronary artery disease.  Risk factors:  Hypertension. Diabetes mellitus. Dyslipidemia.  ------------------------------------------------------------------- Study Conclusions  - Left ventricle: The cavity size was normal. Wall thickness was   normal. Systolic function was normal. The estimated ejection   fraction was in the range of 60% to 65%. Although no diagnostic   regional  wall motion abnormality was identified, this possibility   cannot be completely excluded on the basis of this study. Doppler   parameters are consistent with abnormal left ventricular   relaxation (grade 1 diastolic dysfunction). - Aortic valve: There was no stenosis. - Mitral valve: There was no significant regurgitation. - Right ventricle: The cavity size was normal. Systolic  function   was normal. - Pulmonary arteries: No complete TR doppler jet so unable to   estimate PA systolic pressure. - Inferior vena cava: The vessel was normal in size. The   respirophasic diameter changes were in the normal range (>= 50%),   consistent with normal central venous pressure.  Impressions:  - Technically difficult study with poor acoustic windows. Normal LV   size with EF 60-65%. Normal RV size and systolic function. No   significant valvular abnormalities.  ------------------------------------------------------------------- Study data:  Comparison was made to the study of 02/16/2018.  Study status:  Routine.  Procedure:  The patient reported no pain pre or post test. Transthoracic echocardiography. Image quality was poor. The study was technically difficult, as a result of poor acoustic windows and poor sound wave transmission.  Study completion:  There were no complications.          Transthoracic echocardiography. M-mode, complete 2D, spectral Doppler, and color Doppler. Birthdate:  Patient birthdate: August 10, 1951.  Age:  Patient is 67 yr old.  Sex:  Gender: female.    BMI: 34.6 kg/m^2.  Blood pressure:   95/50  Patient status:  Inpatient.  Study date:  Study date: 07/30/2018. Study time: 01:54 PM.  Location:  Bedside.  -------------------------------------------------------------------  ------------------------------------------------------------------- Left ventricle:  The cavity size was normal. Wall thickness was normal. Systolic function was normal. The estimated ejection fraction was in the range of 60% to 65%. Although no diagnostic regional wall motion abnormality was identified, this possibility cannot be completely excluded on the basis of this study. Doppler parameters are consistent with abnormal left ventricular relaxation (grade 1 diastolic dysfunction).  ------------------------------------------------------------------- Aortic valve:    Trileaflet.  Doppler:   There was no stenosis. There was no regurgitation.    VTI ratio of LVOT to aortic valve: 0.68. Valve area (VTI): 1.93 cm^2. Indexed valve area (VTI): 0.95 cm^2/m^2. Peak velocity ratio of LVOT to aortic valve: 0.59. Valve area (Vmax): 1.69 cm^2. Indexed valve area (Vmax): 0.83 cm^2/m^2. Mean velocity ratio of LVOT to aortic valve: 0.55. Valve area (Vmean): 1.57 cm^2. Indexed valve area (Vmean): 0.77 cm^2/m^2. Mean gradient (S): 8 mm Hg. Peak gradient (S): 14 mm Hg.  ------------------------------------------------------------------- Aorta:  Aortic root: The aortic root was normal in size. Ascending aorta: The ascending aorta was normal in size.  ------------------------------------------------------------------- Mitral valve:   Normal thickness leaflets .  Doppler:   There was no evidence for stenosis.   There was no significant regurgitation.    Peak gradient (D): 3 mm Hg.  ------------------------------------------------------------------- Left atrium:  The atrium was normal in size.  ------------------------------------------------------------------- Right ventricle:  The cavity size was normal. Systolic function was normal.  ------------------------------------------------------------------- Pulmonic valve:    Structurally normal valve.   Cusp separation was normal.  Doppler:  Transvalvular velocity was within the normal range. There was no regurgitation.  ------------------------------------------------------------------- Tricuspid valve:   Doppler:  There was trivial regurgitation.  ------------------------------------------------------------------- Pulmonary artery:   No complete TR doppler jet so unable to estimate PA systolic pressure.  ------------------------------------------------------------------- Right atrium:  The atrium was normal in size.  ------------------------------------------------------------------- Pericardium:   There was no pericardial effusion.  -------------------------------------------------------------------  Systemic veins: Inferior vena cava: The vessel was normal in size. The respirophasic diameter changes were in the normal range (>= 50%), consistent with normal central venous pressure.  ------------------------------------------------------------------- Measurements   Left ventricle                           Value          Reference  LV ID, ED, PLAX chordal          (L)     39    mm       43 - 52  LV ID, ES, PLAX chordal                  32    mm       23 - 38  LV fx shortening, PLAX chordal   (L)     18    %        >=29  LV PW thickness, ED                      10    mm       ----------  IVS/LV PW ratio, ED                      1              <=1.3  Stroke volume, 2D                        73    ml       ----------  Stroke volume/bsa, 2D                    36    ml/m^2   ----------  LV e&', lateral                           8.92  cm/s     ----------  LV E/e&', lateral                         9.79           ----------  LV e&', medial                            6.64  cm/s     ----------  LV E/e&', medial                          13.15          ----------  LV e&', average                           7.78  cm/s     ----------  LV E/e&', average                         11.22          ----------    Ventricular septum                       Value          Reference  IVS thickness, ED  10    mm       ----------    LVOT                                     Value          Reference  LVOT ID, S                               19    mm       ----------  LVOT area                                2.84  cm^2     ----------  LVOT peak velocity, S                    110   cm/s     ----------  LVOT mean velocity, S                    74.5  cm/s     ----------  LVOT VTI, S                              25.6  cm       ----------    Aortic valve                             Value           Reference  Aortic valve peak velocity, S            185   cm/s     ----------  Aortic valve mean velocity, S            135   cm/s     ----------  Aortic valve VTI, S                      37.7  cm       ----------  Aortic mean gradient, S                  8     mm Hg    ----------  Aortic peak gradient, S                  14    mm Hg    ----------  VTI ratio, LVOT/AV                       0.68           ----------  Aortic valve area, VTI                   1.93  cm^2     ----------  Aortic valve area/bsa, VTI               0.95  cm^2/m^2 ----------  Velocity ratio, peak, LVOT/AV            0.59           ----------  Aortic valve area, peak velocity         1.69  cm^2     ----------  Aortic valve area/bsa, peak              0.83  cm^2/m^2 ----------  velocity  Velocity ratio, mean, LVOT/AV            0.55           ----------  Aortic valve area, mean velocity         1.57  cm^2     ----------  Aortic valve area/bsa, mean              0.77  cm^2/m^2 ----------  velocity    Aorta                                    Value          Reference  Aortic root ID, ED                       27    mm       ----------    Left atrium                              Value          Reference  LA ID, A-P, ES                           27    mm       ----------  LA ID/bsa, A-P                           1.33  cm/m^2   <=2.2  LA volume, S                             32.2  ml       ----------  LA volume/bsa, S                         15.9  ml/m^2   ----------  LA volume, ES, 1-p A4C                   27.2  ml       ----------  LA volume/bsa, ES, 1-p A4C               13.4  ml/m^2   ----------  LA volume, ES, 1-p A2C                   34.7  ml       ----------  LA volume/bsa, ES, 1-p A2C               17.1  ml/m^2   ----------    Mitral valve                             Value          Reference  Mitral E-wave peak velocity              87.3  cm/s     ----------  Mitral A-wave peak velocity              92.7   cm/s     ----------  Mitral deceleration time         (H)     293   ms       150 - 230  Mitral peak gradient, D                  3     mm Hg    ----------  Mitral E/A ratio, peak                   0.9            ----------    Tricuspid valve                          Value          Reference  Tricuspid regurg peak velocity           104   cm/s     ----------  Tricuspid peak RV-RA gradient            4     mm Hg    ----------    Right atrium                             Value          Reference  RA ID, S-I, ES, A4C                      37.6  mm       34 - 49  RA area, ES, A4C                         8.77  cm^2     8.3 - 19.5  RA volume, ES, A/L                       17.4  ml       ----------  RA volume/bsa, ES, A/L                   8.6   ml/m^2   ----------    Systemic veins                           Value          Reference  Estimated CVP                            3     mm Hg    ----------    Right ventricle                          Value          Reference  TAPSE                                    15.2  mm       ----------  RV s&', lateral, S                        12    cm/s     ----------  Legend: (L)  and  (H)  mark values outside specified  reference range.  ------------------------------------------------------------------- Prepared and Electronically Authenticated by  Loralie Champagne, M.D. 2019-11-01T15:38:01           Recent Radiology Findings:   Dg Chest 2 View  Result Date: 07/29/2018 CLINICAL DATA:  Chest pain EXAM: CHEST - 2 VIEW COMPARISON:  Chest radiograph and chest CT August 17, 2015; chest radiograph Feb 15, 2018 FINDINGS: There is a stable calcified granuloma in the superior segment of the left lower lobe. There is no edema or consolidation. Heart size and pulmonary vascularity normal. No adenopathy. No pneumothorax. No bone lesions. IMPRESSION: Calcified granuloma on the left, stable. No edema or consolidation. Stable cardiac silhouette. Electronically  Signed   By: Lowella Grip III M.D.   On: 07/29/2018 14:31     I have independently reviewed the above radiologic studies and discussed with the patient   Recent Lab Findings: Lab Results  Component Value Date   WBC 7.4 07/30/2018   HGB 10.8 (L) 07/30/2018   HCT 33.7 (L) 07/30/2018   PLT 244 07/30/2018   GLUCOSE 207 (H) 07/29/2018   CHOL 91 07/30/2018   TRIG 134 07/30/2018   HDL 26 (L) 07/30/2018   LDLCALC 38 07/30/2018   ALT 14 04/28/2018   AST 16 04/28/2018   NA 135 07/29/2018   K 3.8 07/29/2018   CL 102 07/29/2018   CREATININE 1.34 (H) 07/29/2018   BUN 15 07/29/2018   CO2 25 07/29/2018   HGBA1C 8.4 (H) 02/15/2018   Chronic Kidney Disease   Stage I     GFR >90  Stage II    GFR 60-89  Stage IIIA GFR 45-59  Stage IIIB GFR 30-44  Stage IV   GFR 15-29  Stage V    GFR  <15  Lab Results  Component Value Date   CREATININE 1.31 (H) 07/30/2018   Estimated Creatinine Clearance: 45.2 mL/min (A) (by C-G formula based on SCr of 1.31 mg/dL (H)).  P2Y12 -305  Assessment / Plan:      1. Multivessel CAD- Cardiac cath listed above. Patient has had erratic antiplatelet therapy past several months and since admission.  including Plavix  After cath until  D/c by me .-P2Y12 305 . Will require a wash-out period.  On Heparin and Nitro gtt and chest pain free. Plan for CABG  Monday if repeat p2y2 Sunday shows no inhibition. I have reviewed the cath findings with the patient and daughter ( who speaks english fluently and translates ) With left main and  Progression of cad  In setting of DM I have recommended to patient proceeding with CABG. The goals risks and alternatives of the planned surgical procedure CABG have been discussed with the patient in detail. The risks of the procedure including death, infection, stroke, myocardial infarction, bleeding, blood transfusion have all been discussed specifically.  I have quoted Alizae Ballengee a 2 % of perioperative mortality and a complication  rate as high as 30 %. The patient's questions have been answered.Mariabella Wollschlager is willing  to proceed with the planned procedure. 2. Hypertension-Continue Coreg 3. Hyperlipidemia-Continue Lipitor 4. Type 2 Diabetes Mellitus-continue insulin coverage, blood glucose level has improved 5. GERD- patient shares she takes OTC medication for her reflux symptoms. I ordered Protonix daily.   6. History of renal failure with dialysis- none class III ckd  Jlynn Ly B Anhelica Fowers MD      [address] [place] [address] Office [phone]   [place]

## 2018-07-30 NOTE — Progress Notes (Signed)
CRITICAL VALUE ALERT  Critical Value:  Troponin 0.07  Date & Time Notied:  07/30/2018 0542  Provider Notified: Dr. Deforest Hoyles  Orders Received/Actions taken: No new orders

## 2018-07-30 NOTE — Progress Notes (Signed)
  Echocardiogram 2D Echocardiogram has been performed.  Erika Bates 07/30/2018, 3:32 PM

## 2018-07-30 NOTE — Progress Notes (Signed)
Progress Note  Patient Name: Erienne Alexy Date of Encounter: 07/30/2018  Primary Cardiologist: Nanetta Batty, MD   Subjective   Continues to have mild chest discomfort.  R arm feeling much better.  No shortness of breath.   Inpatient Medications    Scheduled Meds: . aspirin  81 mg Oral Daily  . atorvastatin  80 mg Oral q1800  . carvedilol  3.125 mg Oral BID WC  . clopidogrel  75 mg Oral Daily  . insulin aspart  0-15 Units Subcutaneous TID WC  . isosorbide mononitrate  30 mg Oral Daily  . sodium chloride flush  3 mL Intravenous Q12H   Continuous Infusions: . sodium chloride    . heparin Stopped (07/29/18 2148)   PRN Meds: sodium chloride, acetaminophen, morphine injection, nitroGLYCERIN, ondansetron (ZOFRAN) IV, sodium chloride flush   Vital Signs    Vitals:   07/29/18 1712 07/29/18 1717 07/30/18 0553 07/30/18 0759  BP: 125/78 126/73 (!) 97/51 (!) 104/51  Pulse: 70 67    Resp: 14 13    Temp:   97.9 F (36.6 C) 98.2 F (36.8 C)  TempSrc:   Oral Oral  SpO2: 97% 96%    Weight:   88.7 kg   Height:        Intake/Output Summary (Last 24 hours) at 07/30/2018 0807 Last data filed at 07/30/2018 0556 Gross per 24 hour  Intake 660 ml  Output 1600 ml  Net -940 ml   Filed Weights   07/29/18 1237 07/30/18 0553  Weight: 89.4 kg 88.7 kg    Telemetry    Sinus rhythm.  No events.  - Personally Reviewed  ECG    07/29/18: Sinus rhythm.  Rate 65 bpm.  Low voltage. - Personally Reviewed  Physical Exam   VS:  BP (!) 104/51 (BP Location: Left Arm)   Pulse 67   Temp 98.2 F (36.8 C) (Oral)   Resp 13   Ht 5\' 3"  (1.6 m)   Wt 88.7 kg   SpO2 96%   BMI 34.63 kg/m  , BMI Body mass index is 34.63 kg/m. GENERAL:  Well appearing HEENT: Pupils equal round and reactive, fundi not visualized, oral mucosa unremarkable NECK:  No jugular venous distention, waveform within normal limits, carotid upstroke brisk and symmetric, no bruits LUNGS:  Clear to auscultation  bilaterally HEART:  RRR.  PMI not displaced or sustained,S1 and S2 within normal limits, no S3, no S4, no clicks, no rubs, no murmurs ABD:  Flat, positive bowel sounds normal in frequency in pitch, no bruits, no rebound, no guarding, no midline pulsatile mass, no hepatomegaly, no splenomegaly EXT:  2 plus pulses throughout, no edema, no cyanosis no clubbing.  R UE mild hematoma SKIN:  No rashes no nodules NEURO:  Cranial nerves II through XII grossly intact, motor grossly intact throughout [facility]:  Cognitively intact, oriented to person place and time   Labs    Chemistry Recent Labs  Lab 07/29/18 1157  NA 135  K 3.8  CL 102  CO2 25  GLUCOSE 207*  BUN 15  CREATININE 1.34*  CALCIUM 8.9  GFRNONAA 41*  GFRAA 47*  ANIONGAP 8     Hematology Recent Labs  Lab 07/29/18 1157  WBC 8.1  RBC 4.68  HGB 12.5  HCT 40.1  MCV 85.7  MCH 26.7  MCHC 31.2  RDW 12.9  PLT 270    Cardiac Enzymes Recent Labs  Lab 07/29/18 2138 07/30/18 0335  TROPONINI <0.03 0.07*    Recent Labs  Lab 07/29/18 1202  TROPIPOC 0.00     BNPNo results for input(s): BNP, PROBNP in the last 168 hours.   DDimer No results for input(s): DDIMER in the last 168 hours.   Radiology    Dg Chest 2 View  Result Date: 07/29/2018 CLINICAL DATA:  Chest pain EXAM: CHEST - 2 VIEW COMPARISON:  Chest radiograph and chest CT August 17, 2015; chest radiograph Feb 15, 2018 FINDINGS: There is a stable calcified granuloma in the superior segment of the left lower lobe. There is no edema or consolidation. Heart size and pulmonary vascularity normal. No adenopathy. No pneumothorax. No bone lesions. IMPRESSION: Calcified granuloma on the left, stable. No edema or consolidation. Stable cardiac silhouette. Electronically Signed   By: Bretta Bang III M.D.   On: 07/29/2018 14:31    Cardiac Studies   LHC 07/29/18:  Previously placed Ramus stent (unknown type) is widely patent.  Ost Ramus lesion is 95%  stenosed.  Prox LAD-1 lesion is 70% stenosed.  Previously placed Prox LAD-2 stent (unknown type) is widely patent.  Ost LM lesion is 70% stenosed.  Mid Cx to Dist Cx lesion is 100% stenosed.  The left ventricular systolic function is normal.  LV end diastolic pressure is normal.  The left ventricular ejection fraction is 55-65% by visual estimate.   Echo 02/16/18:  Study Conclusions  - Left ventricle: The cavity size was normal. Wall thickness was   normal. Systolic function was normal. The estimated ejection   fraction was in the range of 55% to 60%. Wall motion was normal;   there were no regional wall motion abnormalities. Doppler   parameters are consistent with abnormal left ventricular   relaxation (grade 1 diastolic dysfunction).  Patient Profile     Ms. Mcnulty is a 67 year old [place] female with CAD s/p NSTEMI with LAD/RI PCI 01/2018, hypertension, hyperlipidemia, and diabetes here with unstable angina.  Assessment & Plan    # Unstable angina: # s/p PCI: Ms. Somero presented with chest pain in the setting of not taking antiplatelets for 1 month.  This was an accidental misunderstanding and she thought she was on clopidogrel.  She has extensive progression of disease since her cath in 01/2018, though both stents were patent.  She also had dampening of the left main and concern for significant left main disease.  Given her unintentional noncompliance (mostly due to language barrier and lack of medical literacy) and aggressive progression of disease, CT surgery has been asked to evaluate her.  She was reloaded with Cappetta grill and this would need to be washed out prior to surgery.  Continue aspirin, atorvastatin, and carvedilol.  Imdur started this admission.  LDL 38 this admission.  # Hypertension: BP controlled.  # R UE hematoma:  Developed after radial cath.  Seems to be improving.  Ultrasound pending.   For questions or updates, please contact [facility] Please consult [email or web address] for contact info under        Signed, Chilton Si, MD  07/30/2018, 8:07 AM

## 2018-07-31 ENCOUNTER — Other Ambulatory Visit (HOSPITAL_COMMUNITY): Payer: Self-pay

## 2018-07-31 LAB — SURGICAL PCR SCREEN
MRSA, PCR: NEGATIVE
Staphylococcus aureus: NEGATIVE

## 2018-07-31 LAB — CBC
HEMATOCRIT: 33.8 % — AB (ref 36.0–46.0)
HEMOGLOBIN: 10.9 g/dL — AB (ref 12.0–15.0)
MCH: 26.9 pg (ref 26.0–34.0)
MCHC: 32.2 g/dL (ref 30.0–36.0)
MCV: 83.5 fL (ref 80.0–100.0)
NRBC: 0 % (ref 0.0–0.2)
Platelets: 238 10*3/uL (ref 150–400)
RBC: 4.05 MIL/uL (ref 3.87–5.11)
RDW: 12.7 % (ref 11.5–15.5)
WBC: 8 10*3/uL (ref 4.0–10.5)

## 2018-07-31 LAB — HEPARIN LEVEL (UNFRACTIONATED): Heparin Unfractionated: 0.44 IU/mL (ref 0.30–0.70)

## 2018-07-31 LAB — GLUCOSE, CAPILLARY
GLUCOSE-CAPILLARY: 114 mg/dL — AB (ref 70–99)
Glucose-Capillary: 149 mg/dL — ABNORMAL HIGH (ref 70–99)
Glucose-Capillary: 147 mg/dL — ABNORMAL HIGH (ref 70–99)
Glucose-Capillary: 152 mg/dL — ABNORMAL HIGH (ref 70–99)

## 2018-07-31 NOTE — Progress Notes (Signed)
Discussed sternal precautions, IS (1700 mL however pt could not do it slow), mobility post op and d/c planning. Good understanding. She has materials and I set up preop video for her to watch. She lives with her daughter and 4 grandchildren. Her daughter works and there is a Dispensing optician. She might need extra help at d/c.  1430-1450 Ethelda Chick CES, ACSM 2:50 PM 07/31/2018

## 2018-07-31 NOTE — Progress Notes (Signed)
ANTICOAGULATION CONSULT NOTE - Follow-up Consult  Pharmacy Consult for heparin Indication: chest pain/ACS  No Known Allergies  Patient Measurements: Height: 5\' 3"  (160 cm) Weight: 195 lb 8 oz (88.7 kg) IBW/kg (Calculated) : 52.4 Heparin Dosing Weight: 72.7 kg  Vital Signs: Temp: 98.3 F (36.8 C) (11/01 2350) Temp Source: Oral (11/01 2350) BP: 119/58 (11/01 2350) Pulse Rate: 71 (11/01 2350)  Labs: Recent Labs    07/29/18 1157 07/29/18 2138 07/30/18 0335 07/30/18 0943 07/30/18 1833 07/31/18 0359  HGB 12.5  --   --  10.8*  --  10.9*  HCT 40.1  --   --  33.7*  --  33.8*  PLT 270  --   --  244  --  238  HEPARINUNFRC  --   --   --   --  0.43 0.44  CREATININE 1.34*  --   --  1.31*  --   --   TROPONINI  --  <0.03 0.07* 0.10*  --   --     Estimated Creatinine Clearance: 45.2 mL/min (A) (by C-G formula based on SCr of 1.31 mg/dL (H)).   Medical History: Past Medical History:  Diagnosis Date  . Dialysis patient [facility]) [phone]  . Hypertension   . Myocardial infarction (HCC) 01/2018  . Pre-diabetes      Assessment: Pt is a 69 YOF who presented with CP. S/p cath 10/31 which showed CAD. Heparin was restarted post cath and TCTS consulted.  Heparin was discontinued 10/31 due to R UE hematoma and was resumed 11/1 due to recurrent CP. Patient previously on clopidogrel - plan CABG after wash out - last dose 11/1 P2Y12 305 - shows low platelet inhibition  Heparin drip 850 uts/hr, heparin level is 0.44 and within goal range.   Goal of Therapy:  Heparin level 0.3-0.7 units/ml Monitor platelets by anticoagulation protocol: Yes   Plan:  -Continue Heparin 850 units/hr IV infusion -Heparin level daily wth CBC daily  Ewing Schlein, PharmD PGY1 Pharmacy Resident 07/31/2018    7:51 AM

## 2018-07-31 NOTE — Progress Notes (Signed)
Progress Note  Patient Name: Erika Bates Date of Encounter: 07/31/2018  Primary Cardiologist: Nanetta Batty, MD   Subjective   No further chest pain overnight. She feels "much better" today.   Inpatient Medications    Scheduled Meds: . aspirin  81 mg Oral Daily  . atorvastatin  80 mg Oral q1800  . carvedilol  3.125 mg Oral BID WC  . [START ON 08/02/2018] heparin-papaverine-plasmalyte irrigation   Irrigation To OR  . insulin aspart  0-15 Units Subcutaneous TID WC  . [START ON 08/02/2018] insulin   Intravenous To OR  . isosorbide mononitrate  30 mg Oral Daily  . [START ON 08/02/2018] magnesium sulfate  40 mEq Other To OR  . pantoprazole  40 mg Oral Daily  . [START ON 08/02/2018] phenylephrine  30-200 mcg/min Intravenous To OR  . [START ON 08/02/2018] potassium chloride  80 mEq Other To OR  . sodium chloride flush  3 mL Intravenous Q12H  . [START ON 08/02/2018] tranexamic acid  15 mg/kg Intravenous To OR  . [START ON 08/02/2018] tranexamic acid  2 mg/kg Intracatheter To OR   Continuous Infusions: . sodium chloride    . [START ON 08/02/2018] cefUROXime (ZINACEF)  IV    . [START ON 08/02/2018] cefUROXime (ZINACEF)  IV    . [START ON 08/02/2018] dexmedetomidine    . [START ON 08/02/2018] DOPamine    . [START ON 08/02/2018] epinephrine    . [START ON 08/02/2018] heparin 30,000 units/NS 1000 mL solution for CELLSAVER    . heparin 850 Units/hr (07/31/18 0800)  . [START ON 08/02/2018] milrinone    . nitroGLYCERIN 5 mcg/min (07/31/18 0800)  . [START ON 08/02/2018] nitroGLYCERIN    . [START ON 08/02/2018] norepinephrine (LEVOPHED) Adult infusion    . [START ON 08/02/2018] tranexamic acid (CYKLOKAPRON) infusion ([facility])    . [START ON 08/02/2018] vancomycin     PRN Meds: sodium chloride, acetaminophen, morphine injection, nitroGLYCERIN, ondansetron (ZOFRAN) IV, sodium chloride flush   Vital Signs    Vitals:   07/30/18 1637 07/30/18 1928 07/30/18 2350 07/31/18 0816  BP: 131/66 112/61 (!)  119/58 126/77  Pulse:  71 71 74  Resp:  12 18 (!) 21  Temp: 97.8 F (36.6 C) 98.3 F (36.8 C) 98.3 F (36.8 C) 97.8 F (36.6 C)  TempSrc: Oral Oral Oral Axillary  SpO2: 98% 99% 98% 98%  Weight:      Height:        Intake/Output Summary (Last 24 hours) at 07/31/2018 1148 Last data filed at 07/31/2018 0800 Gross per 24 hour  Intake 352.12 ml  Output 400 ml  Net -47.88 ml   Filed Weights   07/29/18 1237 07/30/18 0553  Weight: 89.4 kg 88.7 kg    Telemetry    Sinus rhythm - Personally Reviewed  ECG    N/A  Physical Exam   VS:  BP 126/77 (BP Location: Left Arm)   Pulse 74   Temp 97.8 F (36.6 C) (Axillary)   Resp (!) 21   Ht 5\' 3"  (1.6 m)   Wt 88.7 kg   SpO2 98%   BMI 34.63 kg/m  , BMI Body mass index is 34.63 kg/m. General appearance: alert and no distress Lungs: clear to auscultation bilaterally Heart: regular rate and rhythm Extremities: extremities normal, atraumatic, no cyanosis or edema Neurologic: Grossly normal  Labs    Chemistry Recent Labs  Lab 07/29/18 1157 07/30/18 0943  NA 135 137  K 3.8 3.8  CL 102 109  CO2  25 23  GLUCOSE 207* 144*  BUN 15 15  CREATININE 1.34* 1.31*  CALCIUM 8.9 8.2*  GFRNONAA 41* 42*  GFRAA 47* 48*  ANIONGAP 8 5     Hematology Recent Labs  Lab 07/29/18 1157 07/30/18 0943 07/31/18 0359  WBC 8.1 7.4 8.0  RBC 4.68 3.99 4.05  HGB 12.5 10.8* 10.9*  HCT 40.1 33.7* 33.8*  MCV 85.7 84.5 83.5  MCH 26.7 27.1 26.9  MCHC 31.2 32.0 32.2  RDW 12.9 12.8 12.7  PLT 270 244 238    Cardiac Enzymes Recent Labs  Lab 07/29/18 2138 07/30/18 0335 07/30/18 0943  TROPONINI <0.03 0.07* 0.10*    Recent Labs  Lab 07/29/18 1202  TROPIPOC 0.00     BNPNo results for input(s): BNP, PROBNP in the last 168 hours.   DDimer No results for input(s): DDIMER in the last 168 hours.   Radiology    Dg Chest 2 View  Result Date: 07/29/2018 CLINICAL DATA:  Chest pain EXAM: CHEST - 2 VIEW COMPARISON:  Chest radiograph and  chest CT August 17, 2015; chest radiograph Feb 15, 2018 FINDINGS: There is a stable calcified granuloma in the superior segment of the left lower lobe. There is no edema or consolidation. Heart size and pulmonary vascularity normal. No adenopathy. No pneumothorax. No bone lesions. IMPRESSION: Calcified granuloma on the left, stable. No edema or consolidation. Stable cardiac silhouette. Electronically Signed   By: Bretta Bang III M.D.   On: 07/29/2018 14:31    Cardiac Studies   LHC 07/29/18:  Previously placed Ramus stent (unknown type) is widely patent.  Ost Ramus lesion is 95% stenosed.  Prox LAD-1 lesion is 70% stenosed.  Previously placed Prox LAD-2 stent (unknown type) is widely patent.  Ost LM lesion is 70% stenosed.  Mid Cx to Dist Cx lesion is 100% stenosed.  The left ventricular systolic function is normal.  LV end diastolic pressure is normal.  The left ventricular ejection fraction is 55-65% by visual estimate.   Echo 02/16/18:  Study Conclusions  - Left ventricle: The cavity size was normal. Wall thickness was   normal. Systolic function was normal. The estimated ejection   fraction was in the range of 55% to 60%. Wall motion was normal;   there were no regional wall motion abnormalities. Doppler   parameters are consistent with abnormal left ventricular   relaxation (grade 1 diastolic dysfunction).  Patient Profile     Erika Bates is a 67 year old [place] female with CAD s/p NSTEMI with LAD/RI PCI 01/2018, hypertension, hyperlipidemia, and diabetes here with unstable angina.  Assessment & Plan    # Unstable angina: # s/p PCI: Ms. Begin presented with chest pain in the setting of not taking antiplatelets for 1 month.  This was an accidental misunderstanding and she thought she was on clopidogrel.  She has extensive progression of disease since her cath in 01/2018, though both stents were patent.  She also had dampening of the left main and concern for  significant left main disease.  Given her unintentional noncompliance (mostly due to language barrier and lack of medical literacy) and aggressive progression of disease, CT surgery has been asked to evaluate her.  She was reloaded with Cappetta grill and this would need to be washed out prior to surgery.  Continue aspirin, atorvastatin, and carvedilol.  Imdur started this admission.  LDL 38 this admission. - Chest pain resolved. Continue IV nitro and heparin. Plan for CABG next week after clopidogrel washout.  #  Hypertension: BP controlled.  # R UE hematoma:  Developed after radial cath.  Ultrasound shows no obstruction.  Chrystie Nose, MD, [facility], FACP  [place]  [facility]  Medical Director of the [facility] Diplomate of the American Board of Clinical Lipidology Attending Cardiologist  Direct Dial: [phone]  Fax: [phone]  Website:  [email or web address]  Chrystie Nose, MD  07/31/2018, 11:48 AM

## 2018-08-01 ENCOUNTER — Inpatient Hospital Stay (HOSPITAL_COMMUNITY): Payer: Medicaid Other

## 2018-08-01 DIAGNOSIS — Z01810 Encounter for preprocedural cardiovascular examination: Secondary | ICD-10-CM

## 2018-08-01 LAB — URINALYSIS, ROUTINE W REFLEX MICROSCOPIC
Bilirubin Urine: NEGATIVE
Glucose, UA: NEGATIVE mg/dL
Ketones, ur: NEGATIVE mg/dL
Nitrite: NEGATIVE
Protein, ur: NEGATIVE mg/dL
Specific Gravity, Urine: 1.003 — ABNORMAL LOW (ref 1.005–1.030)
pH: 6 (ref 5.0–8.0)

## 2018-08-01 LAB — HEPARIN LEVEL (UNFRACTIONATED): HEPARIN UNFRACTIONATED: 0.52 [IU]/mL (ref 0.30–0.70)

## 2018-08-01 LAB — CBC
HCT: 34.8 % — ABNORMAL LOW (ref 36.0–46.0)
Hemoglobin: 11.2 g/dL — ABNORMAL LOW (ref 12.0–15.0)
MCH: 26.9 pg (ref 26.0–34.0)
MCHC: 32.2 g/dL (ref 30.0–36.0)
MCV: 83.7 fL (ref 80.0–100.0)
NRBC: 0 % (ref 0.0–0.2)
PLATELETS: 245 10*3/uL (ref 150–400)
RBC: 4.16 MIL/uL (ref 3.87–5.11)
RDW: 12.7 % (ref 11.5–15.5)
WBC: 6.7 10*3/uL (ref 4.0–10.5)

## 2018-08-01 LAB — BASIC METABOLIC PANEL
ANION GAP: 4 — AB (ref 5–15)
BUN: 15 mg/dL (ref 8–23)
CALCIUM: 8.6 mg/dL — AB (ref 8.9–10.3)
CO2: 26 mmol/L (ref 22–32)
CREATININE: 1.23 mg/dL — AB (ref 0.44–1.00)
Chloride: 107 mmol/L (ref 98–111)
GFR, EST AFRICAN AMERICAN: 52 mL/min — AB (ref >60)
GFR, EST NON AFRICAN AMERICAN: 45 mL/min — AB (ref >60)
GLUCOSE: 105 mg/dL — AB (ref 70–99)
Potassium: 3.7 mmol/L (ref 3.5–5.1)
SODIUM: 137 mmol/L (ref 135–145)

## 2018-08-01 LAB — GLUCOSE, CAPILLARY
GLUCOSE-CAPILLARY: 142 mg/dL — AB (ref 70–99)
Glucose-Capillary: 107 mg/dL — ABNORMAL HIGH (ref 70–99)
Glucose-Capillary: 150 mg/dL — ABNORMAL HIGH (ref 70–99)
Glucose-Capillary: 138 mg/dL — ABNORMAL HIGH (ref 70–99)

## 2018-08-01 LAB — ABO/RH: ABO/RH(D): AB POS

## 2018-08-01 LAB — PLATELET INHIBITION P2Y12: Platelet Function  P2Y12: 375 [PRU] (ref 194–418)

## 2018-08-01 MED ORDER — TEMAZEPAM 7.5 MG PO CAPS: 15.0000 mg | ORAL_CAPSULE | Freq: Once | ORAL | Status: DC | PRN

## 2018-08-01 MED ORDER — CHLORHEXIDINE GLUCONATE CLOTH 2 % EX PADS
6.0000 | MEDICATED_PAD | Freq: Once | CUTANEOUS | Status: AC
  Administered 2018-08-01: 6 via TOPICAL

## 2018-08-01 MED ORDER — BISACODYL 5 MG PO TBEC
5.0000 mg | DELAYED_RELEASE_TABLET | Freq: Once | ORAL | Status: AC
  Administered 2018-08-01: 5 mg via ORAL
  Filled 2018-08-01: qty 1

## 2018-08-01 MED ORDER — CHLORHEXIDINE GLUCONATE 0.12 % MT SOLN
15.0000 mL | Freq: Once | OROMUCOSAL | Status: AC
  Administered 2018-08-02: 15 mL via OROMUCOSAL
  Filled 2018-08-01: qty 15

## 2018-08-01 MED ORDER — CHLORHEXIDINE GLUCONATE CLOTH 2 % EX PADS
6.0000 | MEDICATED_PAD | Freq: Once | CUTANEOUS | Status: AC
  Administered 2018-08-02: 6 via TOPICAL

## 2018-08-01 MED ORDER — METOPROLOL TARTRATE 12.5 MG HALF TABLET: 12.5000 mg | ORAL_TABLET | Freq: Once | ORAL | Status: DC

## 2018-08-01 NOTE — Progress Notes (Signed)
ANTICOAGULATION CONSULT NOTE - Follow-up Consult  Pharmacy Consult for heparin Indication: chest pain/ACS  No Known Allergies  Patient Measurements: Height: 5\' 3"  (160 cm) Weight: 195 lb 8 oz (88.7 kg) IBW/kg (Calculated) : 52.4 Heparin Dosing Weight: 72.7 kg  Vital Signs: Temp: 98.5 F (36.9 C) (11/03 0535) Temp Source: Oral (11/03 0535) BP: 117/62 (11/03 0535) Pulse Rate: 71 (11/03 0535)  Labs: Recent Labs    07/29/18 1157 07/29/18 2138 07/30/18 0335 07/30/18 0943 07/30/18 1833 07/31/18 0359 08/01/18 0252  HGB 12.5  --   --  10.8*  --  10.9* 11.2*  HCT 40.1  --   --  33.7*  --  33.8* 34.8*  PLT 270  --   --  244  --  238 245  HEPARINUNFRC  --   --   --   --  0.43 0.44 0.52  CREATININE 1.34*  --   --  1.31*  --   --  1.23*  TROPONINI  --  <0.03 0.07* 0.10*  --   --   --     Estimated Creatinine Clearance: 48.2 mL/min (A) (by C-G formula based on SCr of 1.23 mg/dL (H)).   Medical History: Past Medical History:  Diagnosis Date  . Dialysis patient [facility]) [phone]  . Hypertension   . Myocardial infarction (HCC) 01/2018  . Pre-diabetes      Assessment: Pt is a 13 YOF who presented with CP. S/p cath 10/31 which showed CAD. Heparin was restarted post cath and TCTS consulted.  Heparin was discontinued 10/31 due to R UE hematoma and was resumed 11/1 due to recurrent CP. Patient previously on clopidogrel - plan CABG after wash out - last dose 11/1 P2Y12 305 - shows low platelet inhibition  Heparin drip 850 uts/hr, heparin level is 0.52 and within goal range. CBC stable.   Goal of Therapy:  Heparin level 0.3-0.7 units/ml Monitor platelets by anticoagulation protocol: Yes   Plan:  -Continue Heparin 850 units/hr IV infusion -Heparin level and CBC daily  Ewing Schlein, PharmD PGY1 Pharmacy Resident 08/01/2018    7:33 AM

## 2018-08-01 NOTE — Anesthesia Preprocedure Evaluation (Addendum)
Anesthesia Evaluation  Patient identified by MRN, date of birth, ID band Patient awake    Reviewed: Allergy & Precautions, H&P , NPO status , Patient's Chart, lab work & pertinent test results, reviewed documented beta blocker date and time   Airway Mallampati: II  TM Distance: >3 FB Neck ROM: Full    Dental no notable dental hx. (+) Teeth Intact, Dental Advisory Given   Pulmonary neg pulmonary ROS,    Pulmonary exam normal breath sounds clear to auscultation       Cardiovascular Exercise Tolerance: Good hypertension, Pt. on medications and Pt. on home beta blockers + CAD, + Past MI and + Cardiac Stents   Rhythm:Regular Rate:Normal     Neuro/Psych negative neurological ROS  negative psych ROS   GI/Hepatic negative GI ROS, Neg liver ROS,   Endo/Other  diabetes  Renal/GU negative Renal ROS  negative genitourinary   Musculoskeletal   Abdominal   Peds  Hematology negative hematology ROS (+)   Anesthesia Other Findings   Reproductive/Obstetrics negative OB ROS                            Anesthesia Physical Anesthesia Plan  ASA: IV  Anesthesia Plan: General   Post-op Pain Management:    Induction: Intravenous  PONV Risk Score and Plan: 3 and Midazolam and Treatment may vary due to age or medical condition  Airway Management Planned: Oral ETT  Additional Equipment: Arterial line, CVP, PA Cath, TEE and Ultrasound Guidance Line Placement  Intra-op Plan:   Post-operative Plan: Post-operative intubation/ventilation  Informed Consent: I have reviewed the patients History and Physical, chart, labs and discussed the procedure including the risks, benefits and alternatives for the proposed anesthesia with the patient or authorized representative who has indicated his/her understanding and acceptance.   Dental advisory given  Plan Discussed with: CRNA  Anesthesia Plan Comments:          Anesthesia Quick Evaluation

## 2018-08-01 NOTE — Progress Notes (Signed)
Progress Note  Patient Name: Erika Bates Date of Encounter: 08/01/2018  Primary Cardiologist: Erika Batty, MD   Subjective   No pain overnight. Plan for CABG tomorrow with Erika Bates.  Inpatient Medications    Scheduled Meds: . aspirin  81 mg Oral Daily  . atorvastatin  80 mg Oral q1800  . carvedilol  3.125 mg Oral BID WC  . [START ON 08/02/2018] heparin-papaverine-plasmalyte irrigation   Irrigation To OR  . insulin aspart  0-15 Units Subcutaneous TID WC  . [START ON 08/02/2018] insulin   Intravenous To OR  . isosorbide mononitrate  30 mg Oral Daily  . [START ON 08/02/2018] magnesium sulfate  40 mEq Other To OR  . pantoprazole  40 mg Oral Daily  . [START ON 08/02/2018] phenylephrine  30-200 mcg/min Intravenous To OR  . [START ON 08/02/2018] potassium chloride  80 mEq Other To OR  . sodium chloride flush  3 mL Intravenous Q12H  . [START ON 08/02/2018] tranexamic acid  15 mg/kg Intravenous To OR  . [START ON 08/02/2018] tranexamic acid  2 mg/kg Intracatheter To OR   Continuous Infusions: . sodium chloride    . [START ON 08/02/2018] cefUROXime (ZINACEF)  IV    . [START ON 08/02/2018] cefUROXime (ZINACEF)  IV    . [START ON 08/02/2018] dexmedetomidine    . [START ON 08/02/2018] DOPamine    . [START ON 08/02/2018] epinephrine    . [START ON 08/02/2018] heparin 30,000 units/NS 1000 mL solution for CELLSAVER    . heparin 850 Units/hr (07/31/18 1529)  . [START ON 08/02/2018] milrinone    . nitroGLYCERIN 5 mcg/min (07/31/18 0800)  . [START ON 08/02/2018] nitroGLYCERIN    . [START ON 08/02/2018] norepinephrine (LEVOPHED) Adult infusion    . [START ON 08/02/2018] tranexamic acid (CYKLOKAPRON) infusion ([facility])    . [START ON 08/02/2018] vancomycin     PRN Meds: sodium chloride, acetaminophen, morphine injection, nitroGLYCERIN, ondansetron (ZOFRAN) IV, sodium chloride flush   Vital Signs    Vitals:   08/01/18 0535 08/01/18 0745 08/01/18 1035 08/01/18 1147  BP: 117/62 (!) 106/52 121/83  124/67  Pulse: 71 71 79 76  Resp: 16 16  20   Temp: 98.5 F (36.9 C) 98.3 F (36.8 C)  98 F (36.7 C)  TempSrc: Oral Oral  Oral  SpO2: 97% 98%  96%  Weight:      Height:        Intake/Output Summary (Last 24 hours) at 08/01/2018 1218 Last data filed at 08/01/2018 1200 Gross per 24 hour  Intake 1722.69 ml  Output 900 ml  Net 822.69 ml   Filed Weights   07/29/18 1237 07/30/18 0553  Weight: 89.4 kg 88.7 kg    Telemetry    Sinus rhythm - Personally Reviewed  ECG    N/A  Physical Exam   VS:  BP 124/67 (BP Location: Left Arm)   Pulse 76   Temp 98 F (36.7 C) (Oral)   Resp 20   Ht 5\' 3"  (1.6 m)   Wt 88.7 kg   SpO2 96%   BMI 34.63 kg/m  , BMI Body mass index is 34.63 kg/m. General appearance: alert and no distress Lungs: clear to auscultation bilaterally Heart: regular rate and rhythm Extremities: extremities normal, atraumatic, no cyanosis or edema Neurologic: Grossly normal   No acute changes overnight on exam  Labs    Chemistry Recent Labs  Lab 07/29/18 1157 07/30/18 0943 08/01/18 0252  NA 135 137 137  K 3.8 3.8 3.7  CL 102 109 107  CO2 25 23 26   GLUCOSE 207* 144* 105*  BUN 15 15 15   CREATININE 1.34* 1.31* 1.23*  CALCIUM 8.9 8.2* 8.6*  GFRNONAA 41* 42* 45*  GFRAA 47* 48* 52*  ANIONGAP 8 5 4*     Hematology Recent Labs  Lab 07/30/18 0943 07/31/18 0359 08/01/18 0252  WBC 7.4 8.0 6.7  RBC 3.99 4.05 4.16  HGB 10.8* 10.9* 11.2*  HCT 33.7* 33.8* 34.8*  MCV 84.5 83.5 83.7  MCH 27.1 26.9 26.9  MCHC 32.0 32.2 32.2  RDW 12.8 12.7 12.7  PLT 244 238 245    Cardiac Enzymes Recent Labs  Lab 07/29/18 2138 07/30/18 0335 07/30/18 0943  TROPONINI <0.03 0.07* 0.10*    Recent Labs  Lab 07/29/18 1202  TROPIPOC 0.00     BNPNo results for input(s): BNP, PROBNP in the last 168 hours.   DDimer No results for input(s): DDIMER in the last 168 hours.   Radiology    No results found.  Cardiac Studies   LHC 07/29/18:  Previously placed  Ramus stent (unknown type) is widely patent.  Ost Ramus lesion is 95% stenosed.  Prox LAD-1 lesion is 70% stenosed.  Previously placed Prox LAD-2 stent (unknown type) is widely patent.  Ost LM lesion is 70% stenosed.  Mid Cx to Dist Cx lesion is 100% stenosed.  The left ventricular systolic function is normal.  LV end diastolic pressure is normal.  The left ventricular ejection fraction is 55-65% by visual estimate.   Echo 02/16/18:  Study Conclusions  - Left ventricle: The cavity size was normal. Wall thickness was   normal. Systolic function was normal. The estimated ejection   fraction was in the range of 55% to 60%. Wall motion was normal;   there were no regional wall motion abnormalities. Doppler   parameters are consistent with abnormal left ventricular   relaxation (grade 1 diastolic dysfunction).  Patient Profile     Erika Bates is a 67 year old [place] female with CAD s/p NSTEMI with LAD/RI PCI 01/2018, hypertension, hyperlipidemia, and diabetes here with unstable angina.  Assessment & Plan    # Unstable angina: # s/p PCI: Erika Bates presented with chest pain in the setting of not taking antiplatelets for 1 month.  This was an accidental misunderstanding and she thought she was on clopidogrel.  She has extensive progression of disease since her cath in 01/2018, though both stents were patent.  She also had dampening of the left main and concern for significant left main disease.  Given her unintentional noncompliance (mostly due to language barrier and lack of medical literacy) and aggressive progression of disease, CT surgery has been asked to evaluate her.  She was reloaded with [place] grill and this would need to be washed out prior to surgery.  Continue aspirin, atorvastatin, and carvedilol.  Imdur started this admission.  LDL 38 this admission. - Chest pain resolved. Continue IV nitro and heparin. Plan for CABG tomorrow. No questions at this point, she wants to  proceed.  # Hypertension: BP controlled.  # R UE hematoma:  Developed after radial cath.  Ultrasound shows no obstruction.  Erika Nose, MD, [facility], FACP  [place]  [facility]  Medical Director of the [facility] Diplomate of the American Board of Clinical Lipidology Attending Cardiologist  Direct Dial: [phone]  Fax: [phone]  Website:  [email or web address]  Erika Nose, MD  08/01/2018, 12:18 PM

## 2018-08-01 NOTE — Progress Notes (Signed)
Pre-op Cardiac Surgery  Carotid Findings:  1-39% ICA stenosis.  Vertebral artery flow is antegrade.   Upper Extremity Right Left  Brachial Pressures 124T 121T  Radial Waveforms T   Ulnar Waveforms T   Palmar Arch (Allen's Test) WNL Doppler signal remains normal with radial compression and nearly obliterates with ulnar compression        Lower  Extremity Right Left  Dorsalis Pedis 128T 138T  Anterior Tibial    Posterior Tibial 133T 133T  Ankle/Brachial Indices 1.07 1.11    Findings:  WNL

## 2018-08-02 ENCOUNTER — Inpatient Hospital Stay (HOSPITAL_COMMUNITY)
Admission: EM | Disposition: A | Discharge status: Home / Self Care | Payer: Self-pay | Source: Home / Self Care | Attending: Cardiothoracic Surgery

## 2018-08-02 ENCOUNTER — Inpatient Hospital Stay (HOSPITAL_COMMUNITY): Payer: Medicaid Other

## 2018-08-02 ENCOUNTER — Inpatient Hospital Stay (HOSPITAL_COMMUNITY): Payer: Medicaid Other | Admitting: Certified Registered Nurse Anesthetist

## 2018-08-02 DIAGNOSIS — Z951 Presence of aortocoronary bypass graft: Secondary | ICD-10-CM

## 2018-08-02 HISTORY — PX: CORONARY ARTERY BYPASS GRAFT: SHX141

## 2018-08-02 HISTORY — PX: TEE WITHOUT CARDIOVERSION: SHX5443

## 2018-08-02 LAB — POCT I-STAT, CHEM 8
BUN: 13 mg/dL (ref 8–23)
BUN: 10 mg/dL (ref 8–23)
BUN: 13 mg/dL (ref 8–23)
BUN: 12 mg/dL (ref 8–23)
BUN: 11 mg/dL (ref 8–23)
BUN: 11 mg/dL (ref 8–23)
CALCIUM ION: 1.02 mmol/L — AB (ref 1.15–1.40)
CALCIUM ION: 1.06 mmol/L — AB (ref 1.15–1.40)
CHLORIDE: 101 mmol/L (ref 98–111)
CHLORIDE: 105 mmol/L (ref 98–111)
CREATININE: 1.1 mg/dL — AB (ref 0.44–1.00)
CREATININE: 1 mg/dL (ref 0.44–1.00)
Calcium, Ion: 1.23 mmol/L (ref 1.15–1.40)
Calcium, Ion: 0.95 mmol/L — ABNORMAL LOW (ref 1.15–1.40)
Calcium, Ion: 1.23 mmol/L (ref 1.15–1.40)
Calcium, Ion: 1.15 mmol/L (ref 1.15–1.40)
Chloride: 106 mmol/L (ref 98–111)
Chloride: 106 mmol/L (ref 98–111)
Chloride: 106 mmol/L (ref 98–111)
Chloride: 110 mmol/L (ref 98–111)
Creatinine, Ser: 0.8 mg/dL (ref 0.44–1.00)
Creatinine, Ser: 1.1 mg/dL — ABNORMAL HIGH (ref 0.44–1.00)
Creatinine, Ser: 0.9 mg/dL (ref 0.44–1.00)
Creatinine, Ser: 1.1 mg/dL — ABNORMAL HIGH (ref 0.44–1.00)
GLUCOSE: 123 mg/dL — AB (ref 70–99)
GLUCOSE: 139 mg/dL — AB (ref 70–99)
GLUCOSE: 206 mg/dL — AB (ref 70–99)
GLUCOSE: 186 mg/dL — AB (ref 70–99)
Glucose, Bld: 115 mg/dL — ABNORMAL HIGH (ref 70–99)
Glucose, Bld: 111 mg/dL — ABNORMAL HIGH (ref 70–99)
HCT: 19 % — ABNORMAL LOW (ref 36.0–46.0)
HCT: 27 % — ABNORMAL LOW (ref 36.0–46.0)
HCT: 23 % — ABNORMAL LOW (ref 36.0–46.0)
HCT: 24 % — ABNORMAL LOW (ref 36.0–46.0)
HEMATOCRIT: 27 % — AB (ref 36.0–46.0)
HEMATOCRIT: 30 % — AB (ref 36.0–46.0)
HEMOGLOBIN: 9.2 g/dL — AB (ref 12.0–15.0)
HEMOGLOBIN: 6.5 g/dL — AB (ref 12.0–15.0)
HEMOGLOBIN: 8.2 g/dL — AB (ref 12.0–15.0)
HEMOGLOBIN: 10.2 g/dL — AB (ref 12.0–15.0)
Hemoglobin: 9.2 g/dL — ABNORMAL LOW (ref 12.0–15.0)
Hemoglobin: 7.8 g/dL — ABNORMAL LOW (ref 12.0–15.0)
POTASSIUM: 3.8 mmol/L (ref 3.5–5.1)
POTASSIUM: 4.1 mmol/L (ref 3.5–5.1)
POTASSIUM: 4.1 mmol/L (ref 3.5–5.1)
Potassium: 3.8 mmol/L (ref 3.5–5.1)
Potassium: 5.1 mmol/L (ref 3.5–5.1)
Potassium: 4.3 mmol/L (ref 3.5–5.1)
SODIUM: 142 mmol/L (ref 135–145)
Sodium: 142 mmol/L (ref 135–145)
Sodium: 141 mmol/L (ref 135–145)
Sodium: 142 mmol/L (ref 135–145)
Sodium: 139 mmol/L (ref 135–145)
Sodium: 140 mmol/L (ref 135–145)
TCO2: 27 mmol/L (ref 22–32)
TCO2: 24 mmol/L (ref 22–32)
TCO2: 26 mmol/L (ref 22–32)
TCO2: 27 mmol/L (ref 22–32)
TCO2: 25 mmol/L (ref 22–32)
TCO2: 22 mmol/L (ref 22–32)

## 2018-08-02 LAB — POCT I-STAT 3, ART BLOOD GAS (G3+)
Acid-base deficit: 1 mmol/L (ref 0.0–2.0)
Acid-base deficit: 5 mmol/L — ABNORMAL HIGH (ref 0.0–2.0)
Acid-base deficit: 2 mmol/L (ref 0.0–2.0)
Acid-base deficit: 4 mmol/L — ABNORMAL HIGH (ref 0.0–2.0)
BICARBONATE: 23.7 mmol/L (ref 20.0–28.0)
BICARBONATE: 21 mmol/L (ref 20.0–28.0)
BICARBONATE: 21.3 mmol/L (ref 20.0–28.0)
Bicarbonate: 21 mmol/L (ref 20.0–28.0)
O2 Saturation: 100 %
O2 Saturation: 99 %
O2 Saturation: 100 %
O2 Saturation: 99 %
PCO2 ART: 39.1 mmHg (ref 32.0–48.0)
PH ART: 7.424 (ref 7.350–7.450)
PH ART: 7.331 — AB (ref 7.350–7.450)
PH ART: 7.317 — AB (ref 7.350–7.450)
PO2 ART: 120 mmHg — AB (ref 83.0–108.0)
Patient temperature: 35.5
Patient temperature: 37
TCO2: 25 mmol/L (ref 22–32)
TCO2: 22 mmol/L (ref 22–32)
TCO2: 22 mmol/L (ref 22–32)
TCO2: 23 mmol/L (ref 22–32)
pCO2 arterial: 36.2 mmHg (ref 32.0–48.0)
pCO2 arterial: 30.7 mmHg — ABNORMAL LOW (ref 32.0–48.0)
pCO2 arterial: 41.8 mmHg (ref 32.0–48.0)
pH, Arterial: 7.443 (ref 7.350–7.450)
pO2, Arterial: 371 mmHg — ABNORMAL HIGH (ref 83.0–108.0)
pO2, Arterial: 183 mmHg — ABNORMAL HIGH (ref 83.0–108.0)
pO2, Arterial: 162 mmHg — ABNORMAL HIGH (ref 83.0–108.0)

## 2018-08-02 LAB — GLUCOSE, CAPILLARY
GLUCOSE-CAPILLARY: 118 mg/dL — AB (ref 70–99)
GLUCOSE-CAPILLARY: 98 mg/dL (ref 70–99)
GLUCOSE-CAPILLARY: 108 mg/dL — AB (ref 70–99)
GLUCOSE-CAPILLARY: 142 mg/dL — AB (ref 70–99)
Glucose-Capillary: 117 mg/dL — ABNORMAL HIGH (ref 70–99)
Glucose-Capillary: 110 mg/dL — ABNORMAL HIGH (ref 70–99)
Glucose-Capillary: 109 mg/dL — ABNORMAL HIGH (ref 70–99)
Glucose-Capillary: 107 mg/dL — ABNORMAL HIGH (ref 70–99)
Glucose-Capillary: 87 mg/dL (ref 70–99)
Glucose-Capillary: 114 mg/dL — ABNORMAL HIGH (ref 70–99)
Glucose-Capillary: 106 mg/dL — ABNORMAL HIGH (ref 70–99)

## 2018-08-02 LAB — CBC
HCT: 33.4 % — ABNORMAL LOW (ref 36.0–46.0)
HCT: 37 % (ref 36.0–46.0)
HCT: 32.9 % — ABNORMAL LOW (ref 36.0–46.0)
HEMOGLOBIN: 11.9 g/dL — AB (ref 12.0–15.0)
Hemoglobin: 10.5 g/dL — ABNORMAL LOW (ref 12.0–15.0)
Hemoglobin: 10.8 g/dL — ABNORMAL LOW (ref 12.0–15.0)
MCH: 26.3 pg (ref 26.0–34.0)
MCH: 27.6 pg (ref 26.0–34.0)
MCH: 27.8 pg (ref 26.0–34.0)
MCHC: 31.4 g/dL (ref 30.0–36.0)
MCHC: 32.2 g/dL (ref 30.0–36.0)
MCHC: 32.8 g/dL (ref 30.0–36.0)
MCV: 83.7 fL (ref 80.0–100.0)
MCV: 85.8 fL (ref 80.0–100.0)
MCV: 84.6 fL (ref 80.0–100.0)
NRBC: 0 % (ref 0.0–0.2)
NRBC: 0 % (ref 0.0–0.2)
PLATELETS: 257 10*3/uL (ref 150–400)
Platelets: 122 10*3/uL — ABNORMAL LOW (ref 150–400)
Platelets: 122 10*3/uL — ABNORMAL LOW (ref 150–400)
RBC: 3.99 MIL/uL (ref 3.87–5.11)
RBC: 4.31 MIL/uL (ref 3.87–5.11)
RBC: 3.89 MIL/uL (ref 3.87–5.11)
RDW: 12.7 % (ref 11.5–15.5)
RDW: 12.7 % (ref 11.5–15.5)
RDW: 12.7 % (ref 11.5–15.5)
WBC: 8.3 10*3/uL (ref 4.0–10.5)
WBC: 15.6 10*3/uL — AB (ref 4.0–10.5)
WBC: 12.4 10*3/uL — ABNORMAL HIGH (ref 4.0–10.5)
nRBC: 0 % (ref 0.0–0.2)

## 2018-08-02 LAB — BASIC METABOLIC PANEL
Anion gap: 5 (ref 5–15)
BUN: 13 mg/dL (ref 8–23)
CO2: 26 mmol/L (ref 22–32)
Calcium: 8.8 mg/dL — ABNORMAL LOW (ref 8.9–10.3)
Chloride: 108 mmol/L (ref 98–111)
Creatinine, Ser: 1.33 mg/dL — ABNORMAL HIGH (ref 0.44–1.00)
GFR calc Af Amer: 47 mL/min — ABNORMAL LOW (ref >60)
GFR calc non Af Amer: 41 mL/min — ABNORMAL LOW (ref >60)
Glucose, Bld: 130 mg/dL — ABNORMAL HIGH (ref 70–99)
Potassium: 3.9 mmol/L (ref 3.5–5.1)
Sodium: 139 mmol/L (ref 135–145)

## 2018-08-02 LAB — HEMOGLOBIN AND HEMATOCRIT, BLOOD
HCT: 24.5 % — ABNORMAL LOW (ref 36.0–46.0)
Hemoglobin: 8 g/dL — ABNORMAL LOW (ref 12.0–15.0)

## 2018-08-02 LAB — CREATININE, SERUM
Creatinine, Ser: 1.16 mg/dL — ABNORMAL HIGH (ref 0.44–1.00)
GFR calc Af Amer: 56 mL/min — ABNORMAL LOW (ref >60)
GFR calc non Af Amer: 48 mL/min — ABNORMAL LOW (ref >60)

## 2018-08-02 LAB — APTT: aPTT: 32 seconds (ref 24–36)

## 2018-08-02 LAB — POCT I-STAT 4, (NA,K, GLUC, HGB,HCT)
GLUCOSE: 142 mg/dL — AB (ref 70–99)
HEMATOCRIT: 36 % (ref 36.0–46.0)
Hemoglobin: 12.2 g/dL (ref 12.0–15.0)
POTASSIUM: 3.8 mmol/L (ref 3.5–5.1)
SODIUM: 144 mmol/L (ref 135–145)

## 2018-08-02 LAB — PREPARE RBC (CROSSMATCH)

## 2018-08-02 LAB — PROTIME-INR
INR: 1.51
Prothrombin Time: 18.1 seconds — ABNORMAL HIGH (ref 11.4–15.2)

## 2018-08-02 LAB — HEPARIN LEVEL (UNFRACTIONATED): HEPARIN UNFRACTIONATED: 0.6 [IU]/mL (ref 0.30–0.70)

## 2018-08-02 LAB — HEMOGLOBIN A1C
Hgb A1c MFr Bld: 7.7 % — ABNORMAL HIGH (ref 4.8–5.6)
Mean Plasma Glucose: 174.29 mg/dL

## 2018-08-02 LAB — PLATELET COUNT: Platelets: 146 10*3/uL — ABNORMAL LOW (ref 150–400)

## 2018-08-02 LAB — MAGNESIUM: Magnesium: 3 mg/dL — ABNORMAL HIGH (ref 1.7–2.4)

## 2018-08-02 SURGERY — CORONARY ARTERY BYPASS GRAFTING (CABG)
Anesthesia: General | Site: Chest

## 2018-08-02 MED ORDER — PHENYLEPHRINE 40 MCG/ML (10ML) SYRINGE FOR IV PUSH (FOR BLOOD PRESSURE SUPPORT)
PREFILLED_SYRINGE | INTRAVENOUS | Status: DC | PRN
  Administered 2018-08-02 (×3): 40 ug via INTRAVENOUS

## 2018-08-02 MED ORDER — HEPARIN SODIUM (PORCINE) 1000 UNIT/ML IJ SOLN
INTRAMUSCULAR | Status: AC
  Filled 2018-08-02: qty 1

## 2018-08-02 MED ORDER — SODIUM CHLORIDE 0.9 % IV SOLN
1.5000 g | Freq: Two times a day (BID) | INTRAVENOUS | Status: AC
  Administered 2018-08-02 – 2018-08-04 (×4): 1.5 g via INTRAVENOUS
  Filled 2018-08-02 (×4): qty 1.5

## 2018-08-02 MED ORDER — SODIUM CHLORIDE 0.9% IV SOLUTION: Freq: Once | INTRAVENOUS | Status: DC

## 2018-08-02 MED ORDER — NITROGLYCERIN IN D5W 200-5 MCG/ML-% IV SOLN: 0.0000 ug/min | INTRAVENOUS | Status: DC

## 2018-08-02 MED ORDER — HEMOSTATIC AGENTS (NO CHARGE) OPTIME
TOPICAL | Status: DC | PRN
  Administered 2018-08-02 (×4): 1 via TOPICAL

## 2018-08-02 MED ORDER — ASPIRIN EC 325 MG PO TBEC
325.0000 mg | DELAYED_RELEASE_TABLET | Freq: Every day | ORAL | Status: DC
  Administered 2018-08-03: 325 mg via ORAL
  Filled 2018-08-02: qty 1

## 2018-08-02 MED ORDER — DOCUSATE SODIUM 100 MG PO CAPS
200.0000 mg | ORAL_CAPSULE | Freq: Every day | ORAL | Status: DC
  Administered 2018-08-03: 200 mg via ORAL
  Filled 2018-08-02: qty 2

## 2018-08-02 MED ORDER — TRANEXAMIC ACID 1000 MG/10ML IV SOLN
INTRAVENOUS | Status: DC | PRN
  Administered 2018-08-02: 1.5 mg/kg/h via INTRAVENOUS

## 2018-08-02 MED ORDER — MIDAZOLAM HCL 2 MG/2ML IJ SOLN
INTRAMUSCULAR | Status: AC
  Filled 2018-08-02: qty 2

## 2018-08-02 MED ORDER — METOPROLOL TARTRATE 5 MG/5ML IV SOLN: 2.5000 mg | INTRAVENOUS | Status: DC | PRN

## 2018-08-02 MED ORDER — SODIUM CHLORIDE 0.9% FLUSH: 3.0000 mL | INTRAVENOUS | Status: DC | PRN

## 2018-08-02 MED ORDER — HEPARIN SODIUM (PORCINE) 1000 UNIT/ML IJ SOLN
INTRAMUSCULAR | Status: DC | PRN
  Administered 2018-08-02: 28000 [IU] via INTRAVENOUS

## 2018-08-02 MED ORDER — LACTATED RINGERS IV SOLN
INTRAVENOUS | Status: DC | PRN
  Administered 2018-08-02: 07:00:00 via INTRAVENOUS

## 2018-08-02 MED ORDER — MIDAZOLAM HCL 5 MG/5ML IJ SOLN
INTRAMUSCULAR | Status: DC | PRN
  Administered 2018-08-02: 2 mg via INTRAVENOUS
  Administered 2018-08-02: 1 mg via INTRAVENOUS
  Administered 2018-08-02: 2 mg via INTRAVENOUS
  Administered 2018-08-02 (×3): 1 mg via INTRAVENOUS
  Administered 2018-08-02 (×2): 2 mg via INTRAVENOUS

## 2018-08-02 MED ORDER — METOPROLOL TARTRATE 25 MG/10 ML ORAL SUSPENSION: 12.5000 mg | Freq: Two times a day (BID) | ORAL | Status: DC

## 2018-08-02 MED ORDER — MIDAZOLAM HCL 2 MG/2ML IJ SOLN: 2.0000 mg | INTRAMUSCULAR | Status: DC | PRN

## 2018-08-02 MED ORDER — MILRINONE LACTATE IN DEXTROSE 20-5 MG/100ML-% IV SOLN
0.1500 ug/kg/min | INTRAVENOUS | Status: AC
  Administered 2018-08-02 (×2): 0.3 ug/kg/min via INTRAVENOUS
  Filled 2018-08-02: qty 100

## 2018-08-02 MED ORDER — PROTAMINE SULFATE 10 MG/ML IV SOLN
INTRAVENOUS | Status: DC | PRN
  Administered 2018-08-02: 260 mg via INTRAVENOUS
  Administered 2018-08-02: 20 mg via INTRAVENOUS

## 2018-08-02 MED ORDER — ONDANSETRON HCL 4 MG/2ML IJ SOLN
4.0000 mg | Freq: Four times a day (QID) | INTRAMUSCULAR | Status: DC | PRN
  Administered 2018-08-03: 4 mg via INTRAVENOUS
  Filled 2018-08-02: qty 2

## 2018-08-02 MED ORDER — PROTAMINE SULFATE 10 MG/ML IV SOLN
INTRAVENOUS | Status: AC
  Filled 2018-08-02: qty 25

## 2018-08-02 MED ORDER — POTASSIUM CHLORIDE 10 MEQ/50ML IV SOLN
10.0000 meq | INTRAVENOUS | Status: AC
  Administered 2018-08-02 (×3): 10 meq via INTRAVENOUS

## 2018-08-02 MED ORDER — LACTATED RINGERS IV SOLN: INTRAVENOUS | Status: DC

## 2018-08-02 MED ORDER — 0.9 % SODIUM CHLORIDE (POUR BTL) OPTIME
TOPICAL | Status: DC | PRN
  Administered 2018-08-02: 6000 mL

## 2018-08-02 MED ORDER — ROCURONIUM BROMIDE 10 MG/ML (PF) SYRINGE
PREFILLED_SYRINGE | INTRAVENOUS | Status: DC | PRN
  Administered 2018-08-02 (×2): 50 mg via INTRAVENOUS
  Administered 2018-08-02: 100 mg via INTRAVENOUS

## 2018-08-02 MED ORDER — BISACODYL 10 MG RE SUPP: 10.0000 mg | Freq: Every day | RECTAL | Status: DC

## 2018-08-02 MED ORDER — MAGNESIUM SULFATE 4 GM/100ML IV SOLN
4.0000 g | Freq: Once | INTRAVENOUS | Status: AC
  Administered 2018-08-02: 4 g via INTRAVENOUS
  Filled 2018-08-02: qty 100

## 2018-08-02 MED ORDER — PROPOFOL 10 MG/ML IV BOLUS
INTRAVENOUS | Status: AC
  Filled 2018-08-02: qty 20

## 2018-08-02 MED ORDER — PROPOFOL 10 MG/ML IV BOLUS
INTRAVENOUS | Status: DC | PRN
  Administered 2018-08-02: 50 mg via INTRAVENOUS

## 2018-08-02 MED ORDER — ACETAMINOPHEN 160 MG/5ML PO SOLN: 1000.0000 mg | Freq: Four times a day (QID) | ORAL | Status: DC

## 2018-08-02 MED ORDER — ACETAMINOPHEN 650 MG RE SUPP
650.0000 mg | Freq: Once | RECTAL | Status: AC
  Administered 2018-08-02: 650 mg via RECTAL

## 2018-08-02 MED ORDER — METOPROLOL TARTRATE 12.5 MG HALF TABLET
12.5000 mg | ORAL_TABLET | Freq: Two times a day (BID) | ORAL | Status: DC
  Administered 2018-08-03 (×2): 12.5 mg via ORAL
  Filled 2018-08-02 (×3): qty 1

## 2018-08-02 MED ORDER — DEXMEDETOMIDINE HCL IN NACL 400 MCG/100ML IV SOLN: 0.0000 ug/kg/h | INTRAVENOUS | Status: DC

## 2018-08-02 MED ORDER — DEXMEDETOMIDINE HCL IN NACL 400 MCG/100ML IV SOLN
INTRAVENOUS | Status: DC | PRN
  Administered 2018-08-02: .5 ug/kg/h via INTRAVENOUS

## 2018-08-02 MED ORDER — ROCURONIUM BROMIDE 50 MG/5ML IV SOSY
PREFILLED_SYRINGE | INTRAVENOUS | Status: AC
  Filled 2018-08-02: qty 20

## 2018-08-02 MED ORDER — NITROGLYCERIN IN D5W 200-5 MCG/ML-% IV SOLN
INTRAVENOUS | Status: DC | PRN
  Administered 2018-08-02: 16.6 ug/min via INTRAVENOUS

## 2018-08-02 MED ORDER — INSULIN REGULAR(HUMAN) IN NACL 100-0.9 UT/100ML-% IV SOLN: INTRAVENOUS | Status: DC

## 2018-08-02 MED ORDER — ALBUMIN HUMAN 5 % IV SOLN
250.0000 mL | INTRAVENOUS | Status: AC | PRN
  Administered 2018-08-02 (×3): 12.5 g via INTRAVENOUS
  Filled 2018-08-02: qty 500

## 2018-08-02 MED ORDER — PANTOPRAZOLE SODIUM 40 MG PO TBEC: 40.0000 mg | DELAYED_RELEASE_TABLET | Freq: Every day | ORAL | Status: DC

## 2018-08-02 MED ORDER — BISACODYL 5 MG PO TBEC
10.0000 mg | DELAYED_RELEASE_TABLET | Freq: Every day | ORAL | Status: DC
  Administered 2018-08-03: 10 mg via ORAL
  Filled 2018-08-02: qty 2

## 2018-08-02 MED ORDER — PROTAMINE SULFATE 10 MG/ML IV SOLN
INTRAVENOUS | Status: AC
  Filled 2018-08-02: qty 5

## 2018-08-02 MED ORDER — SODIUM CHLORIDE 0.9 % IV SOLN
250.0000 mL | INTRAVENOUS | Status: DC
  Administered 2018-08-02: 250 mL via INTRAVENOUS

## 2018-08-02 MED ORDER — ACETAMINOPHEN 160 MG/5ML PO SOLN: 650.0000 mg | Freq: Once | ORAL | Status: AC

## 2018-08-02 MED ORDER — CHLORHEXIDINE GLUCONATE 0.12 % MT SOLN
15.0000 mL | OROMUCOSAL | Status: AC
  Administered 2018-08-02: 15 mL via OROMUCOSAL

## 2018-08-02 MED ORDER — ALBUMIN HUMAN 5 % IV SOLN
INTRAVENOUS | Status: DC | PRN
  Administered 2018-08-02: 12:00:00 via INTRAVENOUS

## 2018-08-02 MED ORDER — ACETAMINOPHEN 500 MG PO TABS
1000.0000 mg | ORAL_TABLET | Freq: Four times a day (QID) | ORAL | Status: DC
  Administered 2018-08-02 – 2018-08-04 (×6): 1000 mg via ORAL
  Filled 2018-08-02 (×6): qty 2

## 2018-08-02 MED ORDER — SODIUM CHLORIDE 0.9 % IV SOLN
INTRAVENOUS | Status: DC | PRN
  Administered 2018-08-02: 25 ug/min via INTRAVENOUS

## 2018-08-02 MED ORDER — TRAMADOL HCL 50 MG PO TABS: 50.0000 mg | ORAL_TABLET | ORAL | Status: DC | PRN

## 2018-08-02 MED ORDER — FAMOTIDINE IN NACL 20-0.9 MG/50ML-% IV SOLN
20.0000 mg | Freq: Two times a day (BID) | INTRAVENOUS | Status: AC
  Administered 2018-08-02 (×2): 20 mg via INTRAVENOUS
  Filled 2018-08-02 (×2): qty 50

## 2018-08-02 MED ORDER — SODIUM CHLORIDE 0.45 % IV SOLN
INTRAVENOUS | Status: DC | PRN
  Administered 2018-08-02: 10 mL/h via INTRAVENOUS

## 2018-08-02 MED ORDER — FENTANYL CITRATE (PF) 250 MCG/5ML IJ SOLN
INTRAMUSCULAR | Status: DC | PRN
  Administered 2018-08-02: 50 ug via INTRAVENOUS
  Administered 2018-08-02: 650 ug via INTRAVENOUS
  Administered 2018-08-02 (×2): 50 ug via INTRAVENOUS
  Administered 2018-08-02: 100 ug via INTRAVENOUS
  Administered 2018-08-02: 50 ug via INTRAVENOUS
  Administered 2018-08-02: 100 ug via INTRAVENOUS

## 2018-08-02 MED ORDER — VANCOMYCIN HCL IN DEXTROSE 1-5 GM/200ML-% IV SOLN
1000.0000 mg | Freq: Once | INTRAVENOUS | Status: AC
  Administered 2018-08-02: 1000 mg via INTRAVENOUS
  Filled 2018-08-02: qty 200

## 2018-08-02 MED ORDER — MORPHINE SULFATE (PF) 2 MG/ML IV SOLN
1.0000 mg | INTRAVENOUS | Status: AC | PRN
  Administered 2018-08-02 (×2): 2 mg via INTRAVENOUS
  Filled 2018-08-02: qty 2
  Filled 2018-08-02: qty 1

## 2018-08-02 MED ORDER — ASPIRIN 81 MG PO CHEW: 324.0000 mg | CHEWABLE_TABLET | Freq: Every day | ORAL | Status: DC

## 2018-08-02 MED ORDER — SODIUM CHLORIDE 0.9% FLUSH
3.0000 mL | Freq: Two times a day (BID) | INTRAVENOUS | Status: DC
  Administered 2018-08-03 (×2): 3 mL via INTRAVENOUS

## 2018-08-02 MED ORDER — SODIUM CHLORIDE 0.9 % IV SOLN
INTRAVENOUS | Status: DC | PRN
  Administered 2018-08-02: 1 [IU]/h via INTRAVENOUS

## 2018-08-02 MED ORDER — VANCOMYCIN HCL 1000 MG IV SOLR
INTRAVENOUS | Status: DC | PRN
  Administered 2018-08-02: 1000 mg via INTRAVENOUS

## 2018-08-02 MED ORDER — DOPAMINE-DEXTROSE 3.2-5 MG/ML-% IV SOLN
2.5000 ug/kg/min | INTRAVENOUS | Status: AC
  Administered 2018-08-02: 3.003 ug/kg/min via INTRAVENOUS

## 2018-08-02 MED ORDER — OXYCODONE HCL 5 MG PO TABS
5.0000 mg | ORAL_TABLET | ORAL | Status: DC | PRN
  Administered 2018-08-02 – 2018-08-03 (×2): 10 mg via ORAL
  Administered 2018-08-03 (×2): 5 mg via ORAL
  Filled 2018-08-02: qty 1
  Filled 2018-08-02: qty 2
  Filled 2018-08-02: qty 1
  Filled 2018-08-02: qty 2

## 2018-08-02 MED ORDER — INSULIN REGULAR BOLUS VIA INFUSION
0.0000 [IU] | Freq: Three times a day (TID) | INTRAVENOUS | Status: DC
  Filled 2018-08-02: qty 10

## 2018-08-02 MED ORDER — PHENYLEPHRINE HCL-NACL 20-0.9 MG/250ML-% IV SOLN
0.0000 ug/min | INTRAVENOUS | Status: DC
  Administered 2018-08-02: 20 ug/min via INTRAVENOUS
  Filled 2018-08-02 (×2): qty 250

## 2018-08-02 MED ORDER — PHENYLEPHRINE 40 MCG/ML (10ML) SYRINGE FOR IV PUSH (FOR BLOOD PRESSURE SUPPORT)
PREFILLED_SYRINGE | INTRAVENOUS | Status: AC
  Filled 2018-08-02: qty 10

## 2018-08-02 MED ORDER — SODIUM CHLORIDE 0.9 % IV SOLN: INTRAVENOUS | Status: DC

## 2018-08-02 MED ORDER — MORPHINE SULFATE (PF) 2 MG/ML IV SOLN
2.0000 mg | INTRAVENOUS | Status: DC | PRN
  Administered 2018-08-03: 2 mg via INTRAVENOUS
  Filled 2018-08-02: qty 1

## 2018-08-02 MED ORDER — ARTIFICIAL TEARS OPHTHALMIC OINT
TOPICAL_OINTMENT | OPHTHALMIC | Status: DC | PRN
  Administered 2018-08-02: 1 via OPHTHALMIC

## 2018-08-02 MED ORDER — MIDAZOLAM HCL 10 MG/2ML IJ SOLN
INTRAMUSCULAR | Status: AC
  Filled 2018-08-02: qty 2

## 2018-08-02 MED ORDER — ARTIFICIAL TEARS OPHTHALMIC OINT
TOPICAL_OINTMENT | OPHTHALMIC | Status: AC
  Filled 2018-08-02: qty 3.5

## 2018-08-02 MED ORDER — LACTATED RINGERS IV SOLN: 500.0000 mL | Freq: Once | INTRAVENOUS | Status: DC | PRN

## 2018-08-02 MED ORDER — FENTANYL CITRATE (PF) 250 MCG/5ML IJ SOLN
INTRAMUSCULAR | Status: AC
  Filled 2018-08-02: qty 25

## 2018-08-02 MED ORDER — LACTATED RINGERS IV SOLN
INTRAVENOUS | Status: DC
  Administered 2018-08-02: 10 mL/h via INTRAVENOUS

## 2018-08-02 MED ORDER — LACTATED RINGERS IV SOLN
INTRAVENOUS | Status: DC | PRN
  Administered 2018-08-02 (×2): via INTRAVENOUS

## 2018-08-02 SURGICAL SUPPLY — 79 items
BAG DECANTER FOR FLEXI CONT (MISCELLANEOUS) ×3 IMPLANT
BANDAGE ACE 4X5 VEL STRL LF (GAUZE/BANDAGES/DRESSINGS) ×3 IMPLANT
BANDAGE ACE 6X5 VEL STRL LF (GAUZE/BANDAGES/DRESSINGS) ×3 IMPLANT
BANDAGE ELASTIC 4 VELCRO ST LF (GAUZE/BANDAGES/DRESSINGS) ×3 IMPLANT
BANDAGE ELASTIC 6 VELCRO ST LF (GAUZE/BANDAGES/DRESSINGS) ×3 IMPLANT
BLADE STERNUM SYSTEM 6 (BLADE) ×3 IMPLANT
BLADE SURG 11 STRL SS (BLADE) ×3 IMPLANT
BNDG GAUZE ELAST 4 BULKY (GAUZE/BANDAGES/DRESSINGS) ×3 IMPLANT
CANISTER SUCT 3000ML PPV (MISCELLANEOUS) ×3 IMPLANT
CATH CPB KIT GERHARDT (MISCELLANEOUS) ×3 IMPLANT
CATH THORACIC 28FR (CATHETERS) IMPLANT
CONN ST 1/4X3/8  BEN (MISCELLANEOUS) ×1
CONN ST 1/4X3/8 BEN (MISCELLANEOUS) ×2 IMPLANT
COVER WAND RF STERILE (DRAPES) ×3 IMPLANT
CRADLE DONUT ADULT HEAD (MISCELLANEOUS) ×3 IMPLANT
DERMABOND ADVANCED (GAUZE/BANDAGES/DRESSINGS) ×1
DERMABOND ADVANCED .7 DNX12 (GAUZE/BANDAGES/DRESSINGS) ×2 IMPLANT
DRAIN CHANNEL 28F RND 3/8 FF (WOUND CARE) ×3 IMPLANT
DRAPE CARDIOVASCULAR INCISE (DRAPES) ×1
DRAPE SLUSH/WARMER DISC (DRAPES) ×3 IMPLANT
DRAPE SRG 135X102X78XABS (DRAPES) ×2 IMPLANT
DRSG AQUACEL AG ADV 3.5X14 (GAUZE/BANDAGES/DRESSINGS) ×3 IMPLANT
ELECT BLADE 4.0 EZ CLEAN MEGAD (MISCELLANEOUS) ×3
ELECT REM PT RETURN 9FT ADLT (ELECTROSURGICAL) ×6
ELECTRODE BLDE 4.0 EZ CLN MEGD (MISCELLANEOUS) ×2 IMPLANT
ELECTRODE REM PT RTRN 9FT ADLT (ELECTROSURGICAL) ×4 IMPLANT
FELT TEFLON 1X6 (MISCELLANEOUS) ×6 IMPLANT
GAUZE SPONGE 4X4 12PLY STRL (GAUZE/BANDAGES/DRESSINGS) ×6 IMPLANT
GAUZE SPONGE 4X4 12PLY STRL LF (GAUZE/BANDAGES/DRESSINGS) ×6 IMPLANT
GLOVE BIO SURGEON STRL SZ 6 (GLOVE) ×9 IMPLANT
GLOVE BIO SURGEON STRL SZ 6.5 (GLOVE) ×18 IMPLANT
GLOVE BIOGEL PI IND STRL 6 (GLOVE) ×4 IMPLANT
GLOVE BIOGEL PI INDICATOR 6 (GLOVE) ×2
GOWN STRL REUS W/ TWL LRG LVL3 (GOWN DISPOSABLE) ×16 IMPLANT
GOWN STRL REUS W/TWL LRG LVL3 (GOWN DISPOSABLE) ×8
HEMOSTAT POWDER KIT SURGIFOAM (HEMOSTASIS) ×9 IMPLANT
HEMOSTAT POWDER SURGIFOAM 1G (HEMOSTASIS) ×3 IMPLANT
HEMOSTAT SURGICEL 2X14 (HEMOSTASIS) ×3 IMPLANT
KIT BASIN OR (CUSTOM PROCEDURE TRAY) ×3 IMPLANT
KIT CATH SUCT 8FR (CATHETERS) ×3 IMPLANT
KIT SUCTION CATH 14FR (SUCTIONS) ×9 IMPLANT
KIT TURNOVER KIT B (KITS) ×3 IMPLANT
KIT VASOVIEW HEMOPRO 2 VH 4000 (KITS) ×3 IMPLANT
KIT VASOVIEW HEMOPRO VH 3000 (KITS) IMPLANT
LEAD PACING MYOCARDI (MISCELLANEOUS) ×3 IMPLANT
MARKER GRAFT CORONARY BYPASS (MISCELLANEOUS) ×6 IMPLANT
NS IRRIG 1000ML POUR BTL (IV SOLUTION) ×18 IMPLANT
PACK E OPEN HEART (SUTURE) ×3 IMPLANT
PACK OPEN HEART (CUSTOM PROCEDURE TRAY) ×3 IMPLANT
PAD ARMBOARD 7.5X6 YLW CONV (MISCELLANEOUS) ×6 IMPLANT
PAD ELECT DEFIB RADIOL ZOLL (MISCELLANEOUS) ×3 IMPLANT
PENCIL BUTTON HOLSTER BLD 10FT (ELECTRODE) ×3 IMPLANT
PUNCH AORTIC ROTATE 4.0MM (MISCELLANEOUS) ×3 IMPLANT
SET CARDIOPLEGIA MPS 5001102 (MISCELLANEOUS) ×3 IMPLANT
SOLUTION ANTI FOG 6CC (MISCELLANEOUS) ×3 IMPLANT
SPONGE LAP 18X18 X RAY DECT (DISPOSABLE) ×12 IMPLANT
SUT BONE WAX W31G (SUTURE) ×3 IMPLANT
SUT MNCRL AB 4-0 PS2 18 (SUTURE) ×3 IMPLANT
SUT PROLENE 3 0 SH1 36 (SUTURE) ×6 IMPLANT
SUT PROLENE 4 0 TF (SUTURE) ×6 IMPLANT
SUT PROLENE 6 0 CC (SUTURE) ×6 IMPLANT
SUT PROLENE 6 0 CC 1 (SUTURE) ×3 IMPLANT
SUT PROLENE 7 0 BV1 MDA (SUTURE) ×3 IMPLANT
SUT PROLENE 7.0 RB 3 (SUTURE) ×6 IMPLANT
SUT PROLENE 8 0 BV175 6 (SUTURE) ×9 IMPLANT
SUT STEEL 6MS V (SUTURE) ×3 IMPLANT
SUT STEEL SZ 6 DBL 3X14 BALL (SUTURE) ×3 IMPLANT
SUT VIC AB 1 CTX 18 (SUTURE) ×6 IMPLANT
SUT VIC AB 2-0 CT1 27 (SUTURE) ×1
SUT VIC AB 2-0 CT1 TAPERPNT 27 (SUTURE) ×2 IMPLANT
SYSTEM SAHARA CHEST DRAIN ATS (WOUND CARE) ×3 IMPLANT
TAPE CLOTH SURG 4X10 WHT LF (GAUZE/BANDAGES/DRESSINGS) ×3 IMPLANT
TAPE PAPER 2X10 WHT MICROPORE (GAUZE/BANDAGES/DRESSINGS) ×3 IMPLANT
TOWEL GREEN STERILE (TOWEL DISPOSABLE) ×3 IMPLANT
TOWEL GREEN STERILE FF (TOWEL DISPOSABLE) ×6 IMPLANT
TRAY FOLEY SLVR 16FR TEMP STAT (SET/KITS/TRAYS/PACK) ×3 IMPLANT
TUBING INSUFFLATION (TUBING) ×3 IMPLANT
UNDERPAD 30X30 (UNDERPADS AND DIAPERS) ×3 IMPLANT
WATER STERILE IRR 1000ML POUR (IV SOLUTION) ×6 IMPLANT

## 2018-08-02 NOTE — Progress Notes (Signed)
Patient ID: Erika Bates, female   DOB: 02-28-1951, 67 y.o.   MRN: [id number] EVENING ROUNDS NOTE :     [address]       [name] [address]             [phone]                 Day of Surgery Procedure(s) (LRB): CORONARY ARTERY BYPASS GRAFTING (CABG) times 2 using left internal mammary artery to LAD  and left greater saphenous vein to Ramus harvested endoscopically. (N/A) TRANSESOPHAGEAL ECHOCARDIOGRAM (TEE) (N/A)  Total Length of Stay:  LOS: 4 days  BP 121/68   Pulse 80   Temp 98.6 F (37 C)   Resp 12   Ht 5\' 3"  (1.6 m)   Wt 88.8 kg   SpO2 100%   BMI 34.67 kg/m   .Intake/Output      11/03 0701 - 11/04 0700 11/04 0701 - 11/05 0700   P.O. 790    I.V. (mL/kg) 324.8 (3.7) 2492.1 (28.1)   Blood  800   IV Piggyback  466.7   Total Intake(mL/kg) 1114.8 (12.6) 3758.8 (42.3)   Urine (mL/kg/hr) 1000 (0.5) 1260 (1.5)   Blood  800   Chest Tube  455   Total Output 1000 2515   Net +114.8 +1243.8        Urine Occurrence 4 x      . sodium chloride Stopped (08/02/18 1515)  . [START ON 08/03/2018] sodium chloride    . sodium chloride    . albumin human 12.5 g (08/02/18 1639)  . cefUROXime (ZINACEF)  IV    . dexmedetomidine (PRECEDEX) IV infusion 0.1 mcg/kg/hr (08/02/18 1600)  . DOPamine Stopped (08/02/18 1416)  . famotidine (PEPCID) IV Stopped (08/02/18 1344)  . insulin 2 mL/hr at 08/02/18 1600  . lactated ringers    . lactated ringers 10 mL/hr at 08/02/18 1600  . lactated ringers 10 mL/hr at 08/02/18 1600  . magnesium sulfate 17 mL/hr at 08/02/18 1600  . milrinone 0.3 mcg/kg/min (08/02/18 1600)  . nitroGLYCERIN    . phenylephrine (NEO-SYNEPHRINE) Adult infusion 25 mcg/min (08/02/18 1600)  . potassium chloride 10 mEq (08/02/18 1611)  . vancomycin       Lab Results  Component Value Date   WBC 15.6 (H) 08/02/2018   HGB 12.2 08/02/2018   HCT 36.0 08/02/2018   PLT 122 (L) 08/02/2018   GLUCOSE 142 (H) 08/02/2018   CHOL 91 07/30/2018   TRIG 134  07/30/2018   HDL 26 (L) 07/30/2018   LDLCALC 38 07/30/2018   ALT 14 04/28/2018   AST 16 04/28/2018   NA 144 08/02/2018   K 3.8 08/02/2018   CL 106 08/02/2018   CREATININE 0.90 08/02/2018   BUN 11 08/02/2018   CO2 26 08/02/2018   INR 1.51 08/02/2018   HGBA1C 7.7 (H) 08/02/2018   Stable postop Not bleeding Weaning vent    Delight Ovens MD  Beeper [phone] Office [phone] 08/02/2018 4:40 PM

## 2018-08-02 NOTE — Progress Notes (Signed)
[place] charge nurse from OR called, notified metoprolol held due to HR 58-59. Heparin drip stopped. Pt blood sugar 117. Daughter by bedside. Informed pt OR will be here in abt 45 min. Will continue to monitor.

## 2018-08-02 NOTE — Brief Op Note (Signed)
      [address]       [name] [address]             [phone]      08/02/2018  3:59 PM  PATIENT:  Erika Bates  67 y.o. female  PRE-OPERATIVE DIAGNOSIS:  CAD  POST-OPERATIVE DIAGNOSIS:  CAD  PROCEDURE:  Procedure(s):  CORONARY ARTERY BYPASS GRAFTING x 2 -LIMA to LAD -SVG to RAMUS INTERMEDIATE  ENDOSCOPIC HARVEST GREATER SAPHENOUS VEIN -Left Thigh -Right Leg Explored, no suitable conduit identified  TRANSESOPHAGEAL ECHOCARDIOGRAM (TEE) (N/A)  SURGEON:  Surgeon(s) and Role:    * Delight Ovens, MD - Primary  PHYSICIAN ASSISTANT: Lowella Dandy PA-C  ANESTHESIA:   general  EBL:  800 mL   BLOOD ADMINISTERED:1 PRBC and  CELLSAVER  DRAINS: Left Pleural Chest Tubes, Mediastinal Chest Drains   LOCAL MEDICATIONS USED:  NONE  SPECIMEN:  No Specimen  DISPOSITION OF SPECIMEN:  N/A  COUNTS:  YES  DICTATION: .Dragon Dictation  PLAN OF CARE: Admit to inpatient   PATIENT DISPOSITION:  ICU - intubated and hemodynamically stable.   Delay start of Pharmacological VTE agent (>24hrs) due to surgical blood loss or risk of bleeding: yes

## 2018-08-02 NOTE — Progress Notes (Signed)
Escorted pt to OR via stretcher with OR staff and pt's daughter. Nitro drip running at 1.53ml/hr, NS@10ml /hr. Report given to [facility] IN OR. CCMD called and notified. Daughter in waiting area. Will drop pt's belongings to 2H when pt gets a room.

## 2018-08-02 NOTE — Progress Notes (Signed)
  Echocardiogram Echocardiogram Transesophageal has been performed.  Erika Bates 08/02/2018, 9:16 AM

## 2018-08-02 NOTE — Anesthesia Procedure Notes (Signed)
Arterial Line Insertion Start/End11/12/2017 7:10 AM, 08/02/2018 7:20 AM Performed by: Gaynelle Adu, MD, anesthesiologist  Patient location: Pre-op. Preanesthetic checklist: patient identified, IV checked, site marked, risks and benefits discussed, surgical consent, monitors and equipment checked, pre-op evaluation, timeout performed and anesthesia consent Lidocaine 1% used for infiltration Left, brachial was placed Catheter size: 20 Fr Hand hygiene performed  and maximum sterile barriers used   Attempts: 1 Procedure performed using ultrasound guided technique. Ultrasound Notes:anatomy identified, needle tip was noted to be adjacent to the nerve/plexus identified, no ultrasound evidence of intravascular and/or intraneural injection and image(s) printed for medical record Following insertion, dressing applied and Biopatch. Post procedure assessment: normal and unchanged

## 2018-08-02 NOTE — Anesthesia Procedure Notes (Signed)
Central Venous Catheter Insertion Performed by: Gaynelle Adu, MD, anesthesiologist Start/End11/12/2017 6:35 AM, 08/02/2018 6:50 AM Patient location: Pre-op. Preanesthetic checklist: patient identified, IV checked, site marked, risks and benefits discussed, surgical consent, monitors and equipment checked, pre-op evaluation, timeout performed and anesthesia consent Hand hygiene performed  and maximum sterile barriers used  PA cath was placed.Swan type:thermodilution Procedure performed without using ultrasound guided technique. Attempts: 1 Patient tolerated the procedure well with no immediate complications.

## 2018-08-02 NOTE — Procedures (Signed)
Extubation Procedure Note  Patient Details:   Name: Erika Bates DOB: [birth year] MRN: [id number]   Airway Documentation:   Patient extubated per protocol. NIF -20, VC 0.9L. Patient had positive cuff leak prior to extubation. Patient placed on 4lpm humidified oxygen post extubation.  Vent end date: (not recorded) Vent end time: (not recorded)   Evaluation  O2 sats: stable throughout Complications: No apparent complications Patient did tolerate procedure well. Bilateral Breath Sounds: Rhonchi   Yes  Suszanne Conners 08/02/2018, 6:12 PM

## 2018-08-02 NOTE — Transfer of Care (Signed)
Immediate Anesthesia Transfer of Care Note  Patient: Erika Bates  Procedure(s) Performed: CORONARY ARTERY BYPASS GRAFTING (CABG) times 2 using left internal mammary artery to LAD  and left greater saphenous vein to Ramus harvested endoscopically. (N/A Chest) TRANSESOPHAGEAL ECHOCARDIOGRAM (TEE) (N/A )  Patient Location: ICU  Anesthesia Type:General  Level of Consciousness: Patient remains intubated per anesthesia plan  Airway & Oxygen Therapy: Patient remains intubated per anesthesia plan and Patient placed on Ventilator (see vital sign flow sheet for setting)  Post-op Assessment: Report given to RN and Post -op Vital signs reviewed and stable  Post vital signs: Reviewed and stable  Last Vitals:  Vitals Value Taken Time  BP 102/60 08/02/2018  1:04 PM  Temp    Pulse 80 08/02/2018  1:06 PM  Resp 12 08/02/2018  1:06 PM  SpO2 100 % 08/02/2018  1:06 PM  Vitals shown include unvalidated device data.  Last Pain:  Vitals:   08/02/18 0415  TempSrc: Oral  PainSc:       Patients Stated Pain Goal: 0 (07/31/18 0310)  Complications: No apparent anesthesia complications

## 2018-08-02 NOTE — Anesthesia Procedure Notes (Signed)
Central Venous Catheter Insertion Performed by: Gaynelle Adu, MD, anesthesiologist Start/End11/12/2017 6:35 AM, 08/02/2018 6:50 AM Patient location: Pre-op. Preanesthetic checklist: patient identified, IV checked, site marked, risks and benefits discussed, surgical consent, monitors and equipment checked, pre-op evaluation, timeout performed and anesthesia consent Position: Trendelenburg Lidocaine 1% used for infiltration and patient sedated Hand hygiene performed , maximum sterile barriers used  and Seldinger technique used Catheter size: 9 Fr Total catheter length 10. Central line was placed.Sheath introducer Swan type:thermodilution Procedure performed using ultrasound guided technique. Ultrasound Notes:anatomy identified, needle tip was noted to be adjacent to the nerve/plexus identified, no ultrasound evidence of intravascular and/or intraneural injection and image(s) printed for medical record Attempts: 1 Following insertion, line sutured, dressing applied and Biopatch. Post procedure assessment: blood return through all ports, free fluid flow and no air  Patient tolerated the procedure well with no immediate complications.

## 2018-08-02 NOTE — Progress Notes (Signed)
      [address]       [name] [address]             [phone]     Pre Procedure note for inpatients:   Erika Bates has been scheduled for Procedure(s): CORONARY ARTERY BYPASS GRAFTING (CABG) (N/A) TRANSESOPHAGEAL ECHOCARDIOGRAM (TEE) (N/A) today. The various methods of treatment have been discussed with the patient. After consideration of the risks, benefits and treatment options the patient has consented to the planned procedure.   The patient has been seen and labs reviewed. There are no changes in the patient's condition to prevent proceeding with the planned procedure today.  Recent labs:  Lab Results  Component Value Date   WBC 8.3 08/02/2018   HGB 10.5 (L) 08/02/2018   HCT 33.4 (L) 08/02/2018   PLT 257 08/02/2018   GLUCOSE 130 (H) 08/02/2018   CHOL 91 07/30/2018   TRIG 134 07/30/2018   HDL 26 (L) 07/30/2018   LDLCALC 38 07/30/2018   ALT 14 04/28/2018   AST 16 04/28/2018   NA 139 08/02/2018   K 3.9 08/02/2018   CL 108 08/02/2018   CREATININE 1.33 (H) 08/02/2018   BUN 13 08/02/2018   CO2 26 08/02/2018   HGBA1C 8.4 (H) 02/15/2018    Delight Ovens, MD 08/02/2018 6:53 AM

## 2018-08-02 NOTE — Anesthesia Procedure Notes (Addendum)
Procedure Name: Intubation Date/Time: 08/02/2018 7:51 AM Performed by: Alvera Novel, CRNA Pre-anesthesia Checklist: Patient identified, Emergency Drugs available, Suction available and Patient being monitored Patient Re-evaluated:Patient Re-evaluated prior to induction Oxygen Delivery Method: Circle System Utilized Preoxygenation: Pre-oxygenation with 100% oxygen Induction Type: IV induction Ventilation: Mask ventilation without difficulty Tube type: Oral Tube size: 8.0 mm Number of attempts: 1 Airway Equipment and Method: Stylet and Oral airway Placement Confirmation: ETT inserted through vocal cords under direct vision,  positive ETCO2 and breath sounds checked- equal and bilateral Secured at: 22 cm Tube secured with: Tape Dental Injury: Teeth and Oropharynx as per pre-operative assessment  Comments: Intubation performed by TS, SRNA

## 2018-08-02 NOTE — Anesthesia Postprocedure Evaluation (Signed)
Anesthesia Post Note  Patient: Erika Bates  Procedure(s) Performed: CORONARY ARTERY BYPASS GRAFTING (CABG) times 2 using left internal mammary artery to LAD  and left greater saphenous vein to Ramus harvested endoscopically. (N/A Chest) TRANSESOPHAGEAL ECHOCARDIOGRAM (TEE) (N/A )     Patient location during evaluation: SICU Anesthesia Type: General Level of consciousness: sedated Pain management: pain level controlled Vital Signs Assessment: post-procedure vital signs reviewed and stable Respiratory status: patient remains intubated per anesthesia plan Cardiovascular status: stable Postop Assessment: no apparent nausea or vomiting Anesthetic complications: no    Last Vitals:  Vitals:   08/02/18 1400 08/02/18 1413  BP:    Pulse: 80 80  Resp: 12 12  Temp: (!) 35.8 C (!) 36 C  SpO2: 100% 100%    Last Pain:  Vitals:   08/02/18 1345  TempSrc:   PainSc: Asleep                 Daylyn Christine,W. EDMOND

## 2018-08-02 NOTE — Plan of Care (Signed)
Patient alert and oriented.  Dangled on side of bed without incident.  Tolerated fairly well.  Removed BP cuff from right upper arm, utilizing Aline for pressure monitoring.  Patient indicated pain in right upper arm, area of bruising noted as was also reported during shift change report which was present PTA.  Monitoring.

## 2018-08-03 ENCOUNTER — Encounter (HOSPITAL_COMMUNITY): Payer: Self-pay | Admitting: Cardiothoracic Surgery

## 2018-08-03 ENCOUNTER — Inpatient Hospital Stay (HOSPITAL_COMMUNITY): Payer: Medicaid Other

## 2018-08-03 LAB — GLUCOSE, CAPILLARY
GLUCOSE-CAPILLARY: 135 mg/dL — AB (ref 70–99)
Glucose-Capillary: 102 mg/dL — ABNORMAL HIGH (ref 70–99)
Glucose-Capillary: 105 mg/dL — ABNORMAL HIGH (ref 70–99)
Glucose-Capillary: 122 mg/dL — ABNORMAL HIGH (ref 70–99)
Glucose-Capillary: 132 mg/dL — ABNORMAL HIGH (ref 70–99)
Glucose-Capillary: 163 mg/dL — ABNORMAL HIGH (ref 70–99)
Glucose-Capillary: 97 mg/dL (ref 70–99)

## 2018-08-03 LAB — BASIC METABOLIC PANEL
Anion gap: 7 (ref 5–15)
BUN: 9 mg/dL (ref 8–23)
CHLORIDE: 109 mmol/L (ref 98–111)
CO2: 21 mmol/L — ABNORMAL LOW (ref 22–32)
CREATININE: 1.19 mg/dL — AB (ref 0.44–1.00)
Calcium: 7.6 mg/dL — ABNORMAL LOW (ref 8.9–10.3)
GFR calc Af Amer: 54 mL/min — ABNORMAL LOW (ref >60)
GFR calc non Af Amer: 47 mL/min — ABNORMAL LOW (ref >60)
Glucose, Bld: 112 mg/dL — ABNORMAL HIGH (ref 70–99)
POTASSIUM: 4.1 mmol/L (ref 3.5–5.1)
Sodium: 137 mmol/L (ref 135–145)

## 2018-08-03 LAB — CBC
HEMATOCRIT: 30.4 % — AB (ref 36.0–46.0)
HEMATOCRIT: 29.7 % — AB (ref 36.0–46.0)
HEMOGLOBIN: 9.6 g/dL — AB (ref 12.0–15.0)
HEMOGLOBIN: 9.3 g/dL — AB (ref 12.0–15.0)
MCH: 26.9 pg (ref 26.0–34.0)
MCH: 27.1 pg (ref 26.0–34.0)
MCHC: 31.6 g/dL (ref 30.0–36.0)
MCHC: 31.3 g/dL (ref 30.0–36.0)
MCV: 85.2 fL (ref 80.0–100.0)
MCV: 86.6 fL (ref 80.0–100.0)
Platelets: 128 10*3/uL — ABNORMAL LOW (ref 150–400)
Platelets: 111 10*3/uL — ABNORMAL LOW (ref 150–400)
RBC: 3.57 MIL/uL — ABNORMAL LOW (ref 3.87–5.11)
RBC: 3.43 MIL/uL — ABNORMAL LOW (ref 3.87–5.11)
RDW: 13 % (ref 11.5–15.5)
RDW: 13.4 % (ref 11.5–15.5)
WBC: 13.6 10*3/uL — ABNORMAL HIGH (ref 4.0–10.5)
WBC: 11.4 10*3/uL — ABNORMAL HIGH (ref 4.0–10.5)
nRBC: 0 % (ref 0.0–0.2)
nRBC: 0 % (ref 0.0–0.2)

## 2018-08-03 LAB — CREATININE, SERUM
CREATININE: 1.29 mg/dL — AB (ref 0.44–1.00)
GFR, EST AFRICAN AMERICAN: 49 mL/min — AB (ref >60)
GFR, EST NON AFRICAN AMERICAN: 42 mL/min — AB (ref >60)

## 2018-08-03 LAB — MAGNESIUM
MAGNESIUM: 1.9 mg/dL (ref 1.7–2.4)
Magnesium: 2.3 mg/dL (ref 1.7–2.4)

## 2018-08-03 MED ORDER — INSULIN DETEMIR 100 UNIT/ML ~~LOC~~ SOLN
10.0000 [IU] | Freq: Every day | SUBCUTANEOUS | Status: DC
  Administered 2018-08-04 – 2018-08-05 (×2): 10 [IU] via SUBCUTANEOUS
  Filled 2018-08-03 (×2): qty 0.1

## 2018-08-03 MED ORDER — INSULIN ASPART 100 UNIT/ML ~~LOC~~ SOLN
0.0000 [IU] | SUBCUTANEOUS | Status: DC
  Administered 2018-08-03: 2 [IU] via SUBCUTANEOUS
  Administered 2018-08-03: 4 [IU] via SUBCUTANEOUS
  Administered 2018-08-03: 2 [IU] via SUBCUTANEOUS

## 2018-08-03 MED ORDER — INSULIN ASPART 100 UNIT/ML ~~LOC~~ SOLN
0.0000 [IU] | SUBCUTANEOUS | Status: DC
  Administered 2018-08-03 (×2): 2 [IU] via SUBCUTANEOUS

## 2018-08-03 MED ORDER — METOCLOPRAMIDE HCL 5 MG/ML IJ SOLN
5.0000 mg | Freq: Three times a day (TID) | INTRAMUSCULAR | Status: AC
  Administered 2018-08-03 (×3): 5 mg via INTRAVENOUS
  Filled 2018-08-03 (×3): qty 2

## 2018-08-03 MED ORDER — INSULIN DETEMIR 100 UNIT/ML ~~LOC~~ SOLN
10.0000 [IU] | Freq: Once | SUBCUTANEOUS | Status: AC
  Administered 2018-08-03: 10 [IU] via SUBCUTANEOUS
  Filled 2018-08-03: qty 0.1

## 2018-08-03 MED ORDER — DIPHENHYDRAMINE HCL 50 MG/ML IJ SOLN
12.5000 mg | Freq: Four times a day (QID) | INTRAMUSCULAR | Status: DC | PRN
  Administered 2018-08-03 – 2018-08-04 (×2): 12.5 mg via INTRAVENOUS
  Filled 2018-08-03 (×2): qty 1

## 2018-08-03 MED ORDER — ENOXAPARIN SODIUM 30 MG/0.3ML ~~LOC~~ SOLN
30.0000 mg | Freq: Every day | SUBCUTANEOUS | Status: DC
  Administered 2018-08-03 – 2018-08-06 (×4): 30 mg via SUBCUTANEOUS
  Filled 2018-08-03 (×4): qty 0.3

## 2018-08-03 MED ORDER — FUROSEMIDE 10 MG/ML IJ SOLN
40.0000 mg | Freq: Once | INTRAMUSCULAR | Status: AC
  Administered 2018-08-03: 40 mg via INTRAVENOUS
  Filled 2018-08-03: qty 4

## 2018-08-03 MED FILL — Mannitol IV Soln 20%: INTRAVENOUS | Qty: 500 | Status: AC

## 2018-08-03 MED FILL — Lidocaine HCl(Cardiac) IV PF Soln Pref Syr 100 MG/5ML (2%): INTRAVENOUS | Qty: 10 | Status: AC

## 2018-08-03 MED FILL — Sodium Chloride IV Soln 0.9%: INTRAVENOUS | Qty: 2000 | Status: AC

## 2018-08-03 MED FILL — Heparin Sodium (Porcine) Inj 1000 Unit/ML: INTRAMUSCULAR | Qty: 10 | Status: AC

## 2018-08-03 MED FILL — Sodium Bicarbonate IV Soln 8.4%: INTRAVENOUS | Qty: 50 | Status: AC

## 2018-08-03 MED FILL — Albumin, Human Inj 5%: INTRAVENOUS | Qty: 250 | Status: AC

## 2018-08-03 MED FILL — Electrolyte-R (PH 7.4) Solution: INTRAVENOUS | Qty: 4000 | Status: AC

## 2018-08-03 NOTE — Op Note (Signed)
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  SURGICAL PROCEDURE:  Coronary artery bypass grafting x2 with the left internal mammary to the left anterior descending coronary artery and reverse saphenous vein graft to the ramus with left greater saphenous endoscopic vein harvesting.  SURGEON:  Sheliah Plane, MD  FIRST ASSISTANT:  Lowella Dandy, PA-C  BRIEF HISTORY:  The patient is a 67 year old Asian woman who is known to the cardiology service, having presented with chest pain in May 2019.  At that time, stents in the LAD and ramus were placed in July.  The patient had been discharged home on  Brilinta in July of this year.  She was unable to afford Brilinta.  The intention was to switch her to Plavix.  However, she ultimately never got the prescription and had been off Plavix.  She presented to the emergency room with recurrent chest pain,  underwent cardiac catheterization by Dr. Allyson Sabal.  These cath films were reviewed.  The patient had 70% left main obstruction, severe narrowing of the ramus branch, which was a large supplier of the lateral wall.  The circumflex proper was a small totally  occluded vessel.  The patient was reloaded with Plavix, and a cardiac surgery consultation was requested because the films in April suggested ostial right disease versus catheter spasm.  On the current films, it appears the patient does not have any  significant disease in the right coronary artery.  Because of the discrepancy in the films and reports, I did ask Dr. Jens Som and Dr. Mortimer Fries  to review the films.   Ultimately, we decided that the findings in the right coronary artery were probably catheter  spasm, though the patient did have significant left main disease, LAD and ramus disease.  The circuit was not bypassable.  With the patient's return of symptoms, coronary artery bypass grafting was recommended.  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  Risks and options of surgery were discussed with her, and she was agreeable and signed informed consent.  DESCRIPTION OF PROCEDURE:  With Swan-Ganz and arterial line monitors in place, the patient underwent general endotracheal anesthesia without incident.  Skin on the chest and legs was prepped with Betadine, draped in the usual sterile manner.  Appropriate  timeout was performed, and we proceeded with a small incision at the right knee.  This vein at the right knee was relatively small.  A similar incision was made at the left knee, and the vein on the left was of better quality.  The left greater  saphenous vein was harvested endoscopically and was of good quality and caliber.  Median sternotomy was performed.  The left internal mammary artery was dissected down as a pedicle graft.  The distal artery was divided and had good free flow.   Pericardium was opened.  It should be noted the left pleural space was totally obliterated.  Pericardium was opened.  The ascending aorta and right atrium were cannulated after systemic heparinization.  The patient was placed on cardiopulmonary bypass,  2.4 L/min per sq m.  Sites of anastomoses were selected and dissected at the epicardium.  The patient's  body temperature was cooled to 32 degrees.  Aortic cross-clamp was applied and 500 mL of cold blood potassium cardioplegia was administered with  diastolic arrest of the heart.  It was noted the very distal circumflex was totally occluded and less than 1 mm in size and not bypassable.  The ramus branch was  partially intramyocardial.  This portion of the ramus was opened, but the distal vessel was  very small and an extremely poor quality vessel and not bypassable.  We oversewed this and moved to the larger first branch of the ramus, which admitted a 1 mm probe.  Using a running 8-0 Prolene, a segment of reverse saphenous vein graft was anastomosed  to the ramus in a similar fashion.  The distal LAD was opened.  It admitted a 1 mm probe proximally and distally.  Using a running 8-0 Prolene, the left internal mammary artery was anastomosed to left anterior descending coronary artery.  With  cross-clamp still in place, a single punch aortotomy was performed, and the vein graft was anastomosed to the ascending aorta.  The heart was allowed to passively fill and deair, and the proximal anastomosis completed.  The bulldog was removed from the  mammary artery with proper rise in myocardial septal temperature.  The pedicle was tacked to the epicardium.  Aortic cross-clamp was removed with a total cross-clamp time of 64 minutes.  The patient spontaneously converted to a sinus rhythm on low-dose  milrinone and dopamine.  She was then warmed to 37 degrees.  Atrial and ventricular pacing wires were applied.  The patient was then weaned from cardiopulmonary bypass without difficulty.  She remained hemodynamically stable.  She was decannulated in the  usual fashion.  Protamine sulfate was administered.  With the operative field hemostatic, a single Blake drain was left in the mediastinum.  The pericardium was loosely reapproximated.  The sternum was closed with #6 stainless steel wire.  The fascia  was closed with interrupted 0 Vicryl, running 3-0 Vicryl subcutaneous tissue, 3-0 subcuticular stitch in skin edges.  Dry dressings were applied.  Sponge and needle count was reported as correct at completion of the procedure.  The patient was scanned  for RF tag laps and reported as clear.  The patient did require 2 units of  packed red blood cells because of low hematocrit, both preoperatively and while on bypass.  LN/NUANCE  D:08/03/2018 T:08/03/2018 JOB:[id number]

## 2018-08-03 NOTE — Progress Notes (Signed)
Patient ID: Erika Bates, female   DOB: June 14, 1951, 67 y.o.   MRN: [id number] [facility] DAILY ICU PROGRESS NOTE                   [address]            Erika Bates [address]          [phone]   1 Day Post-Op Procedure(s) (LRB): CORONARY ARTERY BYPASS GRAFTING (CABG) times 2 using left internal mammary artery to LAD  and left greater saphenous vein to Ramus harvested endoscopically. (N/A) TRANSESOPHAGEAL ECHOCARDIOGRAM (TEE) (N/A)  Total Length of Stay:  LOS: 5 days   Subjective: Patient awake alert extubated last night  Objective: Vital signs in last 24 hours: Temp:  [95.5 F (35.3 C)-99.1 F (37.3 C)] 99 F (37.2 C) (11/05 0700) Pulse Rate:  [66-90] 80 (11/05 0700) Cardiac Rhythm: Normal sinus rhythm (11/05 0400) Resp:  [0-28] 12 (11/05 0700) BP: (79-122)/(42-75) 98/59 (11/04 2100) SpO2:  [100 %] 100 % (11/05 0700) Arterial Line BP: (81-140)/(47-78) 108/47 (11/05 0700) FiO2 (%):  [40 %-50 %] 40 % (11/04 1735) Weight:  [94.7 kg] 94.7 kg (11/05 0500)  Filed Weights   07/30/18 0553 08/02/18 0415 08/03/18 0500  Weight: 88.7 kg 88.8 kg 94.7 kg    Weight change: 5.931 kg   Hemodynamic parameters for last 24 hours: PAP: (15-32)/(4-19) 24/12 CO:  [3.3 L/min-4.9 L/min] 4.8 L/min CI:  [1.7 L/min/m2-2.6 L/min/m2] 2.5 L/min/m2  Intake/Output from previous day: 11/04 0701 - 11/05 0700 In: 4985.6 [I.V.:3225.1; Blood:800; IV Piggyback:960.5] Out: 3490 [Urine:2065; Blood:800; Chest Tube:625]  Intake/Output this shift: No intake/output data recorded.  Current Meds: Scheduled Meds: . acetaminophen  1,000 mg Oral Q6H   Or  . acetaminophen (TYLENOL) oral liquid 160 mg/5 mL  1,000 mg Per Tube Q6H  . aspirin EC  325 mg Oral Daily   Or  . aspirin  324 mg Per Tube Daily  . atorvastatin  80 mg Oral q1800  . bisacodyl  10 mg Oral Daily   Or  . bisacodyl  10 mg Rectal Daily  . docusate sodium  200 mg Oral Daily  . insulin aspart  0-24 Units Subcutaneous Q4H  .  insulin regular  0-10 Units Intravenous TID WC  . metoprolol tartrate  12.5 mg Oral BID   Or  . metoprolol tartrate  12.5 mg Per Tube BID  . [START ON 08/04/2018] pantoprazole  40 mg Oral Daily  . sodium chloride flush  3 mL Intravenous Q12H   Continuous Infusions: . sodium chloride 10 mL/hr at 08/03/18 0700  . sodium chloride 250 mL (08/02/18 2132)  . sodium chloride    . albumin human 12.5 g (08/02/18 1639)  . cefUROXime (ZINACEF)  IV Stopped (08/03/18 0402)  . dexmedetomidine (PRECEDEX) IV infusion Stopped (08/02/18 1603)  . DOPamine Stopped (08/02/18 1416)  . lactated ringers    . lactated ringers 10 mL/hr at 08/02/18 2217  . lactated ringers 10 mL/hr at 08/03/18 0700  . milrinone 0.3 mcg/kg/min (08/03/18 0700)  . nitroGLYCERIN    . phenylephrine (NEO-SYNEPHRINE) Adult infusion 30 mcg/min (08/03/18 0700)   PRN Meds:.sodium chloride, albumin human, lactated ringers, metoprolol tartrate, midazolam, morphine injection, ondansetron (ZOFRAN) IV, oxyCODONE, sodium chloride flush, traMADol  General appearance: alert, cooperative and no distress Neurologic: intact Heart: regular rate and rhythm, S1, S2 normal, no murmur, click, rub or gallop Lungs: diminished breath sounds bibasilar Abdomen: soft, non-tender; bowel sounds normal; no masses,  no organomegaly Extremities: extremities  normal, atraumatic, no cyanosis or edema and Homans sign is negative, no sign of DVT Wound: Sternal dressing intact  Lab Results: CBC: Recent Labs    08/02/18 1821 08/02/18 1828 08/03/18 0327  WBC 12.4*  --  13.6*  HGB 10.8* 10.2* 9.6*  HCT 32.9* 30.0* 30.4*  PLT 122*  --  128*   BMET:  Recent Labs    08/02/18 0405  08/02/18 1828 08/03/18 0327  NA 139   < > 142 137  K 3.9   < > 4.3 4.1  CL 108   < > 110 109  CO2 26  --   --  21*  GLUCOSE 130*   < > 111* 112*  BUN 13   < > 11 9  CREATININE 1.33*   < > 1.10* 1.19*  CALCIUM 8.8*  --   --  7.6*   < > = values in this interval not displayed.     CMET: Lab Results  Component Value Date   WBC 13.6 (H) 08/03/2018   HGB 9.6 (L) 08/03/2018   HCT 30.4 (L) 08/03/2018   PLT 128 (L) 08/03/2018   GLUCOSE 112 (H) 08/03/2018   CHOL 91 07/30/2018   TRIG 134 07/30/2018   HDL 26 (L) 07/30/2018   LDLCALC 38 07/30/2018   ALT 14 04/28/2018   AST 16 04/28/2018   NA 137 08/03/2018   K 4.1 08/03/2018   CL 109 08/03/2018   CREATININE 1.19 (H) 08/03/2018   BUN 9 08/03/2018   CO2 21 (L) 08/03/2018   INR 1.51 08/02/2018   HGBA1C 7.7 (H) 08/02/2018      PT/INR:  Recent Labs    08/02/18 1303  LABPROT 18.1*  INR 1.51   Radiology: Dg Chest Port 1 View  Result Date: 08/03/2018 CLINICAL DATA:  Status post CABG. EXAM: PORTABLE CHEST 1 VIEW COMPARISON:  08/02/2018 and prior exams FINDINGS: Cardiomegaly and CABG changes again noted. An endotracheal tube has been removed. A RIGHT IJ Swan-Ganz catheter with tip overlying the main/RIGHT pulmonary artery and mediastinal tube again noted. LEFT perihilar atelectasis again noted with slightly increased bibasilar atelectasis. There is no evidence of pneumothorax or large pleural effusion. IMPRESSION: Endotracheal tube removal with slight increase in mild bibasilar atelectasis. No other significant change. Electronically Signed   By: Harmon Pier M.D.   On: 08/03/2018 07:09   Dg Chest Port 1 View  Result Date: 08/02/2018 CLINICAL DATA:  Postop bypass surgery. EXAM: PORTABLE CHEST 1 VIEW COMPARISON:  Chest x-ray 07/29/2018 FINDINGS: The endotracheal tube is right at the carina and could be retracted 2-3 cm. The right IJ Swan-Ganz catheter tip appears to be in the main pulmonary artery. A mediastinal drain tube is in place. No chest tubes. Minimal streaky areas of atelectasis but no infiltrates or pulmonary edema. Probable small left pleural effusion. IMPRESSION: 1. The endotracheal tube is right at the carina and could be retracted 2-3 cm. 2. The other support apparatus appears in good position. 3. Minimal  postoperative atelectasis and probable small left effusion. No edema or pneumothorax. Electronically Signed   By: Rudie Meyer M.D.   On: 08/02/2018 13:26     Assessment/Plan: S/P Procedure(s) (LRB): CORONARY ARTERY BYPASS GRAFTING (CABG) times 2 using left internal mammary artery to LAD  and left greater saphenous vein to Ramus harvested endoscopically. (N/A) TRANSESOPHAGEAL ECHOCARDIOGRAM (TEE) (N/A) Mobilize Diuresis Diabetes control d/c tubes/lines Expected Acute  Blood - loss Anemia- continue to monitor  Renal function stable Wean off milrinone and dopamine  Delight Ovens 08/03/2018 7:18 AM

## 2018-08-03 NOTE — Progress Notes (Signed)
Patient ID: Erika Bates, female   DOB: 04/22/1951, 67 y.o.   MRN: [id number] [facility] Evening Rounds:  Hemodynamically stable off milrinone  sats 96%  Urine output ok

## 2018-08-03 NOTE — Care Management Note (Signed)
Case Management Note Initial CM Note documented by Letha Cape RNCM  Patient Details  Name: Erika Bates MRN: [id number] Date of Birth: 1950-10-02  Subjective/Objective:  From home , presents with chest pain, s/p cath, will need surgery,  NCM spoke with daughter and patient, daughter states she thought the statin medication was patient's anticoagulant and was not taking the plavix that MD put patient on. Daughter states she had too much on her plate at the time.  NCM informed her about her follow up apts and medication ast at the [facility], daughter states she understands and she has been to both clinics. NCM asked if she has any questions, she states no she understands about her apts and discount medications at the clinic. Patient is now for surgery.                    Action/Plan: NCM will follow for transition of care needs.   Expected Discharge Date:  07/30/18               Expected Discharge Plan:  Home/Self Care  In-House Referral:     Discharge planning Services  CM Consult, Medication Assistance, Follow-up appt scheduled, [facility]  Post Acute Care Choice:    Choice offered to:     DME Arranged:    DME Agency:     HH Arranged:    HH Agency:     Status of Service:  In process, will continue to follow  If discussed at Long Length of Stay Meetings, dates discussed:    Additional Comments: 08/03/18 @ 1052-Shaasia Odle RNCM-Patient is s/p CABG. Previous CM arranged a hospital f/u appointment at: [facility] on 08/24/18 @ 10:10a; AVS updated. [facility] was discussed and would be utilized for $4-10 Rx. CM will continue to follow.   Colleen Can RN, BSN, NCM-BC, ACM-RN [phone] 08/03/2018, 10:48 AM

## 2018-08-03 NOTE — Plan of Care (Signed)
  Problem: Education: Goal: Knowledge of General Education information will improve Description Including pain rating scale, medication(s)/side effects and non-pharmacologic comfort measures Outcome: Progressing   Problem: Activity: Goal: Ability to return to baseline activity level will improve Outcome: Progressing   Problem: Health Behavior/Discharge Planning: Goal: Ability to safely manage health-related needs after discharge will improve Outcome: Progressing   Problem: Education: Goal: Knowledge of disease or condition will improve Outcome: Progressing Goal: Knowledge of the prescribed therapeutic regimen will improve Outcome: Progressing   Problem: Activity: Goal: Risk for activity intolerance will decrease Outcome: Progressing   Problem: Cardiac: Goal: Will achieve and/or maintain hemodynamic stability Outcome: Progressing   Problem: Clinical Measurements: Goal: Postoperative complications will be avoided or minimized Outcome: Progressing   Problem: Respiratory: Goal: Respiratory status will improve Outcome: Progressing   Problem: Skin Integrity: Goal: Risk for impaired skin integrity will decrease Outcome: Progressing   Problem: Urinary Elimination: Goal: Ability to achieve and maintain adequate renal perfusion and functioning will improve Outcome: Progressing

## 2018-08-03 NOTE — Plan of Care (Signed)
  Problem: Education: Goal: Knowledge of General Education information will improve Description Including pain rating scale, medication(s)/side effects and non-pharmacologic comfort measures Outcome: Progressing   Problem: Activity: Goal: Ability to return to baseline activity level will improve Outcome: Progressing   Problem: Health Behavior/Discharge Planning: Goal: Ability to safely manage health-related needs after discharge will improve Outcome: Progressing

## 2018-08-04 ENCOUNTER — Inpatient Hospital Stay (HOSPITAL_COMMUNITY): Payer: Medicaid Other

## 2018-08-04 LAB — GLUCOSE, CAPILLARY
GLUCOSE-CAPILLARY: 140 mg/dL — AB (ref 70–99)
GLUCOSE-CAPILLARY: 117 mg/dL — AB (ref 70–99)
Glucose-Capillary: 98 mg/dL (ref 70–99)
Glucose-Capillary: 119 mg/dL — ABNORMAL HIGH (ref 70–99)
Glucose-Capillary: 113 mg/dL — ABNORMAL HIGH (ref 70–99)
Glucose-Capillary: 146 mg/dL — ABNORMAL HIGH (ref 70–99)

## 2018-08-04 LAB — BASIC METABOLIC PANEL
Anion gap: 4 — ABNORMAL LOW (ref 5–15)
BUN: 10 mg/dL (ref 8–23)
CO2: 25 mmol/L (ref 22–32)
Calcium: 8.1 mg/dL — ABNORMAL LOW (ref 8.9–10.3)
Chloride: 106 mmol/L (ref 98–111)
Creatinine, Ser: 1.28 mg/dL — ABNORMAL HIGH (ref 0.44–1.00)
GFR calc Af Amer: 50 mL/min — ABNORMAL LOW (ref >60)
GFR calc non Af Amer: 43 mL/min — ABNORMAL LOW (ref >60)
Glucose, Bld: 101 mg/dL — ABNORMAL HIGH (ref 70–99)
Potassium: 3.9 mmol/L (ref 3.5–5.1)
Sodium: 135 mmol/L (ref 135–145)

## 2018-08-04 LAB — CBC
HCT: 27.9 % — ABNORMAL LOW (ref 36.0–46.0)
Hemoglobin: 8.8 g/dL — ABNORMAL LOW (ref 12.0–15.0)
MCH: 27.3 pg (ref 26.0–34.0)
MCHC: 31.5 g/dL (ref 30.0–36.0)
MCV: 86.6 fL (ref 80.0–100.0)
Platelets: 106 10*3/uL — ABNORMAL LOW (ref 150–400)
RBC: 3.22 MIL/uL — ABNORMAL LOW (ref 3.87–5.11)
RDW: 13.3 % (ref 11.5–15.5)
WBC: 11.1 10*3/uL — ABNORMAL HIGH (ref 4.0–10.5)
nRBC: 0 % (ref 0.0–0.2)

## 2018-08-04 MED ORDER — BISACODYL 5 MG PO TBEC: 10.0000 mg | DELAYED_RELEASE_TABLET | Freq: Every day | ORAL | Status: DC | PRN

## 2018-08-04 MED ORDER — INSULIN ASPART 100 UNIT/ML ~~LOC~~ SOLN
0.0000 [IU] | Freq: Three times a day (TID) | SUBCUTANEOUS | Status: DC
  Administered 2018-08-04: 2 [IU] via SUBCUTANEOUS
  Administered 2018-08-05: 4 [IU] via SUBCUTANEOUS
  Administered 2018-08-06 (×3): 2 [IU] via SUBCUTANEOUS

## 2018-08-04 MED ORDER — ONDANSETRON HCL 4 MG PO TABS: 4.0000 mg | ORAL_TABLET | Freq: Four times a day (QID) | ORAL | Status: DC | PRN

## 2018-08-04 MED ORDER — SODIUM CHLORIDE 0.9% FLUSH
3.0000 mL | Freq: Two times a day (BID) | INTRAVENOUS | Status: DC
  Administered 2018-08-04 – 2018-08-07 (×7): 3 mL via INTRAVENOUS

## 2018-08-04 MED ORDER — GUAIFENESIN ER 600 MG PO TB12: 600.0000 mg | ORAL_TABLET | Freq: Two times a day (BID) | ORAL | Status: DC | PRN

## 2018-08-04 MED ORDER — BISACODYL 10 MG RE SUPP: 10.0000 mg | Freq: Every day | RECTAL | Status: DC | PRN

## 2018-08-04 MED ORDER — CLOPIDOGREL BISULFATE 75 MG PO TABS
75.0000 mg | ORAL_TABLET | Freq: Every day | ORAL | Status: DC
  Administered 2018-08-04 – 2018-08-07 (×4): 75 mg via ORAL
  Filled 2018-08-04 (×4): qty 1

## 2018-08-04 MED ORDER — CARVEDILOL 3.125 MG PO TABS: 3.1250 mg | ORAL_TABLET | Freq: Two times a day (BID) | ORAL | Status: DC

## 2018-08-04 MED ORDER — TRAMADOL HCL 50 MG PO TABS: 50.0000 mg | ORAL_TABLET | ORAL | Status: DC | PRN

## 2018-08-04 MED ORDER — ONDANSETRON HCL 4 MG/2ML IJ SOLN: 4.0000 mg | Freq: Four times a day (QID) | INTRAMUSCULAR | Status: DC | PRN

## 2018-08-04 MED ORDER — FUROSEMIDE 40 MG PO TABS
40.0000 mg | ORAL_TABLET | Freq: Every day | ORAL | Status: DC
  Administered 2018-08-04 – 2018-08-05 (×2): 40 mg via ORAL
  Filled 2018-08-04 (×2): qty 1

## 2018-08-04 MED ORDER — POTASSIUM CHLORIDE CRYS ER 10 MEQ PO TBCR
10.0000 meq | EXTENDED_RELEASE_TABLET | Freq: Every day | ORAL | Status: DC
  Administered 2018-08-04 – 2018-08-05 (×2): 10 meq via ORAL
  Filled 2018-08-04 (×2): qty 1

## 2018-08-04 MED ORDER — POTASSIUM CHLORIDE CRYS ER 20 MEQ PO TBCR: 20.0000 meq | EXTENDED_RELEASE_TABLET | Freq: Every day | ORAL | Status: DC

## 2018-08-04 MED ORDER — DOCUSATE SODIUM 100 MG PO CAPS
200.0000 mg | ORAL_CAPSULE | Freq: Every day | ORAL | Status: DC
  Administered 2018-08-04 – 2018-08-07 (×4): 200 mg via ORAL
  Filled 2018-08-04 (×4): qty 2

## 2018-08-04 MED ORDER — GLIMEPIRIDE 4 MG PO TABS
2.0000 mg | ORAL_TABLET | Freq: Every day | ORAL | Status: DC
  Administered 2018-08-05 – 2018-08-07 (×3): 2 mg via ORAL
  Filled 2018-08-04 (×3): qty 1

## 2018-08-04 MED ORDER — SODIUM CHLORIDE 0.9 % IV SOLN: 250.0000 mL | INTRAVENOUS | Status: DC | PRN

## 2018-08-04 MED ORDER — OXYCODONE HCL 5 MG PO TABS: 5.0000 mg | ORAL_TABLET | ORAL | Status: DC | PRN

## 2018-08-04 MED ORDER — FUROSEMIDE 10 MG/ML IJ SOLN: 40.0000 mg | Freq: Once | INTRAMUSCULAR | Status: DC

## 2018-08-04 MED ORDER — CARVEDILOL 3.125 MG PO TABS
3.1250 mg | ORAL_TABLET | Freq: Two times a day (BID) | ORAL | Status: DC
  Administered 2018-08-04 – 2018-08-07 (×7): 3.125 mg via ORAL
  Filled 2018-08-04 (×7): qty 1

## 2018-08-04 MED ORDER — PANTOPRAZOLE SODIUM 40 MG PO TBEC
40.0000 mg | DELAYED_RELEASE_TABLET | Freq: Every day | ORAL | Status: DC
  Administered 2018-08-04 – 2018-08-07 (×4): 40 mg via ORAL
  Filled 2018-08-04 (×4): qty 1

## 2018-08-04 MED ORDER — MOVING RIGHT ALONG BOOK
Freq: Once | Status: AC
  Administered 2018-08-04: 09:00:00
  Filled 2018-08-04: qty 1

## 2018-08-04 MED ORDER — ASPIRIN EC 81 MG PO TBEC
81.0000 mg | DELAYED_RELEASE_TABLET | Freq: Every day | ORAL | Status: DC
  Administered 2018-08-04 – 2018-08-07 (×4): 81 mg via ORAL
  Filled 2018-08-04 (×4): qty 1

## 2018-08-04 MED ORDER — SODIUM CHLORIDE 0.9% FLUSH: 3.0000 mL | INTRAVENOUS | Status: DC | PRN

## 2018-08-04 MED ORDER — ACETAMINOPHEN 325 MG PO TABS
650.0000 mg | ORAL_TABLET | Freq: Four times a day (QID) | ORAL | Status: DC | PRN
  Administered 2018-08-04 – 2018-08-06 (×6): 650 mg via ORAL
  Filled 2018-08-04 (×7): qty 2

## 2018-08-04 NOTE — Progress Notes (Addendum)
[facility] DAILY ICU PROGRESS NOTE                   [address]            [name] [address]          [phone]   2 Days Post-Op Procedure(s) (LRB): CORONARY ARTERY BYPASS GRAFTING (CABG) times 2 using left internal mammary artery to LAD  and left greater saphenous vein to Ramus harvested endoscopically. (N/A) TRANSESOPHAGEAL ECHOCARDIOGRAM (TEE) (N/A)  Total Length of Stay:  LOS: 6 days   Subjective:  No specific complaints.  States she is doing fairly well.  + ambulation  No BM yet  Objective: Vital signs in last 24 hours: Temp:  [98.6 F (37 C)-99 F (37.2 C)] 98.6 F (37 C) (11/06 0400) Pulse Rate:  [71-97] 97 (11/06 0600) Cardiac Rhythm: Normal sinus rhythm (11/06 0400) Resp:  [12-22] 19 (11/06 0600) BP: (92-111)/(52-76) 105/66 (11/06 0600) SpO2:  [94 %-100 %] 96 % (11/06 0600) Arterial Line BP: (97-112)/(46-51) 101/50 (11/05 1400) Weight:  [93.3 kg] 93.3 kg (11/06 0600)  Filed Weights   08/02/18 0415 08/03/18 0500 08/04/18 0600  Weight: 88.8 kg 94.7 kg 93.3 kg    Weight change: -1.4 kg   Hemodynamic parameters for last 24 hours: PAP: (22-23)/(8-13) 23/8  Intake/Output from previous day: 11/05 0701 - 11/06 0700 In: 475.8 [P.O.:220; I.V.:255.8] Out: 2225 [Urine:2175; Chest Tube:50]  Current Meds: Scheduled Meds: . acetaminophen  1,000 mg Oral Q6H   Or  . acetaminophen (TYLENOL) oral liquid 160 mg/5 mL  1,000 mg Per Tube Q6H  . aspirin EC  325 mg Oral Daily   Or  . aspirin  324 mg Per Tube Daily  . atorvastatin  80 mg Oral q1800  . bisacodyl  10 mg Oral Daily   Or  . bisacodyl  10 mg Rectal Daily  . docusate sodium  200 mg Oral Daily  . enoxaparin (LOVENOX) injection  30 mg Subcutaneous QHS  . furosemide  40 mg Intravenous Once  . insulin aspart  0-24 Units Subcutaneous Q4H  . insulin detemir  10 Units Subcutaneous Daily  . metoprolol tartrate  12.5 mg Oral BID   Or  . metoprolol tartrate  12.5 mg Per Tube BID  . pantoprazole  40  mg Oral Daily  . potassium chloride  20 mEq Oral Daily  . sodium chloride flush  3 mL Intravenous Q12H   Continuous Infusions: . sodium chloride Stopped (08/03/18 1901)  . sodium chloride 250 mL (08/02/18 2132)  . sodium chloride    . lactated ringers    . lactated ringers 10 mL/hr at 08/02/18 2217  . lactated ringers Stopped (08/03/18 1450)   PRN Meds:.sodium chloride, diphenhydrAMINE, lactated ringers, metoprolol tartrate, midazolam, morphine injection, ondansetron (ZOFRAN) IV, oxyCODONE, sodium chloride flush, traMADol  General appearance: alert, cooperative and no distress Heart: regular rate and rhythm Lungs: clear to auscultation bilaterally Abdomen: soft, non-tender; bowel sounds normal; no masses,  no organomegaly Extremities: edema trace Wound: clean and dry EVH sites, aquacel remains in place  Lab Results: CBC: Recent Labs    08/03/18 1814 08/04/18 0229  WBC 11.4* 11.1*  HGB 9.3* 8.8*  HCT 29.7* 27.9*  PLT 111* 106*   BMET:  Recent Labs    08/03/18 0327 08/03/18 1814 08/04/18 0229  NA 137  --  135  K 4.1  --  3.9  CL 109  --  106  CO2 21*  --  25  GLUCOSE 112*  --  101*  BUN 9  --  10  CREATININE 1.19* 1.29* 1.28*  CALCIUM 7.6*  --  8.1*    CMET: Lab Results  Component Value Date   WBC 11.1 (H) 08/04/2018   HGB 8.8 (L) 08/04/2018   HCT 27.9 (L) 08/04/2018   PLT 106 (L) 08/04/2018   GLUCOSE 101 (H) 08/04/2018   CHOL 91 07/30/2018   TRIG 134 07/30/2018   HDL 26 (L) 07/30/2018   LDLCALC 38 07/30/2018   ALT 14 04/28/2018   AST 16 04/28/2018   NA 135 08/04/2018   K 3.9 08/04/2018   CL 106 08/04/2018   CREATININE 1.28 (H) 08/04/2018   BUN 10 08/04/2018   CO2 25 08/04/2018   INR 1.51 08/02/2018   HGBA1C 7.7 (H) 08/02/2018      PT/INR:  Recent Labs    08/02/18 1303  LABPROT 18.1*  INR 1.51   Radiology: No results found.   Assessment/Plan: S/P Procedure(s) (LRB): CORONARY ARTERY BYPASS GRAFTING (CABG) times 2 using left internal  mammary artery to LAD  and left greater saphenous vein to Ramus harvested endoscopically. (N/A) TRANSESOPHAGEAL ECHOCARDIOGRAM (TEE) (N/A)  1. CV- NSR, BP is a little labile- off all drips, will d/c Lopressor and restart home Coreg at 3.125 mg BID 2. Pulm- no acute issues, no pneumothorax on CXR, + atelectasis, continue IS 3. Renal- creatinine at 1.28, which is stable, baseline is 1.3, weight is elevated, will give IV lasix 40 mg once today, supplement K 4. Expected post operative blood loss anemia, mild Hgb at 8.8 5. Expected Thrombocytopenia- dropped to 106K today, will monitor 6. DM-sugars controlled, continue insulin for now 7. Dispo- patient stable, maintaining NSR, off all drips, will give IV lasix today, watch platelet count, stable for transfer to 4E     Erika Bates 08/04/2018 8:08 AM   Plan transfer to stepdown , continue asa and plavix  I have seen and examined Erika Bates and agree with the above assessment  and plan.  Delight Ovens MD Beeper [phone] Office [phone] 08/04/2018 8:17 AM

## 2018-08-04 NOTE — Discharge Summary (Signed)
Physician Discharge Summary  Patient ID: Charis Juliana MRN: [id number] DOB/AGE: [birth year] 68 y.o.  Admit date: 07/29/2018 Discharge date: 08/07/2018  Admission Diagnoses:  Patient Active Problem List   Diagnosis Date Noted  . Chest pain 07/29/2018  . S/P coronary artery stent placement   . Pure hypercholesterolemia   . CAD S/P percutaneous coronary angioplasty 02/26/2018  . Type 2 diabetes mellitus without complication, without long-term current use of insulin ([place]) 02/17/2018  . Hyperlipidemia LDL goal <70 02/17/2018  . Essential hypertension 02/17/2018  . Non-ST elevation (NSTEMI) myocardial infarction ([place]) 02/16/2018  . Unstable angina ([place]) 02/15/2018  . History of kidney problems 07/22/2017   Discharge Diagnoses:   Patient Active Problem List   Diagnosis Date Noted  . S/P CABG x 2 08/02/2018  . Chest pain 07/29/2018  . S/P coronary artery stent placement   . Pure hypercholesterolemia   . CAD S/P percutaneous coronary angioplasty 02/26/2018  . Type 2 diabetes mellitus without complication, without long-term current use of insulin ([place]) 02/17/2018  . Hyperlipidemia LDL goal <70 02/17/2018  . Essential hypertension 02/17/2018  . Non-ST elevation (NSTEMI) myocardial infarction ([place]) 02/16/2018  . Unstable angina ([place]) 02/15/2018  . History of kidney problems 07/22/2017   Discharged Condition: good  History of Present Illness:  Ms. Jacobs is a 67 yo [place] Female with known history of HTN, Hyperlipidemia, Type 2 DM, and known CAD S/P STEMI with PCI performed in 01/2018.  The patient was discharged home on Brilinta, but ultimately was unable to afford this medication.  She presented in August asking if she could take a different medication.  She was suppose to be started on Plavix, but this did not occur.  The patient has been experiencing chest pain off and on for the past 2 years.  On 07/28/2018 the patient developed chest discomfort and again took NTG.  There was no  relief in symptoms prompting the patient to repeat her NTG dose.  Due to this her family brought her to the ED.  Work up in the ED did not reveal an acute MI.  However, she was admitted for further care and workup.    Hospital Course:   She was taken for cardiac catheterization on 07/30/2018 which showed LM disease.  It was felt the patient would require bypass surgery and TCTS consult was requested.  She was evaluated by Dr. Servando Snare who was in agreement the patient would benefit from revascularization.  The risks and benefits of the procedure were explained to the patient and she was agreeable to proceed.  She was taken to the operating room on 08/02/2018.  She underwent CABG x 2 utilizing LIMA to LAD and SVG to Ramus Intermediate.  The patient also underwent endoscopic harvest of greater saphenous vein from her left leg.  Her right leg was explored, but no saphenous vein could be found.  She tolerated the procedure without difficulty.  She was taken to the SICU in stable condition.  She was extubated the evening of surgery.  During her stay in the SICU the patient was weaned off Milrinone and Dopamine as tolerated.  Her chest tubes and arterial lines were removed without difficulty.  She was maintaining NSR and was restarted on her home regimen of Coreg.  She was ambulating in the SICU and was medically stable for transfer to the telemetry unit on 08/04/2018.  She continues to make progress.  She remains in NSR.  Her pacing wires were removed without difficulty.  She has been transitioned back  to her oral diabetic medication.  She is ambulating independently.  Her incisions are healing without evidence of infection.  She is medically stable for discharge home today.  Significant Diagnostic Studies: angiography:    Previously placed Ramus stent (unknown type) is widely patent.  Ost Ramus lesion is 95% stenosed.  Prox LAD-1 lesion is 70% stenosed.  Previously placed Prox LAD-2 stent (unknown type) is  widely patent.  Ost LM lesion is 70% stenosed.  Mid Cx to Dist Cx lesion is 100% stenosed.  The left ventricular systolic function is normal.  LV end diastolic pressure is normal.  The left ventricular ejection fraction is 55-65% by visual estimate.  Treatments: surgery:    Coronary artery bypass grafting x2 with the left internal mammary to the left anterior descending coronary artery and reverse saphenous vein graft to the ramus with left greater saphenous endoscopic vein harvesting.  Discharge Exam: Blood pressure (!) 113/52, pulse 72, temperature 98 F (36.7 C), temperature source Oral, resp. rate 18, height 5' 3"  (1.6 m), weight 90.2 kg, SpO2 98 %.   General appearance: alert, cooperative and no distress Heart: regular rate and rhythm Lungs: clear to auscultation bilaterally Abdomen: soft, non-tender; bowel sounds normal; no masses,  no organomegaly Extremities: edema none Wound: clean and dry  Disposition: Home  Discharge Medications:   The patient has been discharged on:   1.Beta Blocker:  Yes [ X  ]                              No   [   ]                              If No, reason:  2.Ace Inhibitor/ARB: Yes [   ]                                     No  [ X   ]                                     If No, reason: hypotension  3.Statin:   Yes [ X  ]                  No  [   ]                  If No, reason:  4.Ecasa:  Yes  [ X ]                  No   [   ]                  If No, reason:   Allergies as of 08/07/2018   No Known Allergies     Medication List    STOP taking these medications   nitroGLYCERIN 0.4 MG SL tablet Commonly known as:  NITROSTAT     TAKE these medications   acetaminophen 325 MG tablet Commonly known as:  TYLENOL Take 2 tablets (650 mg total) by mouth every 6 (six) hours as needed for mild pain.   aspirin 81 MG EC tablet Take 1 tablet (81 mg total) by mouth daily.   atorvastatin 80 MG tablet  Commonly known as:   LIPITOR Take 1 tablet (80 mg total) by mouth daily at 6 PM.   blood glucose meter kit and supplies Kit Dispense based on patient and insurance preference. Use up to four times daily as directed. (FOR ICD-9 250.00, 250.01).   carvedilol 3.125 MG tablet Commonly known as:  COREG Take 1 tablet (3.125 mg total) by mouth 2 (two) times daily with a meal.   clopidogrel 75 MG tablet Commonly known as:  PLAVIX Take 1 tablet (75 mg total) by mouth daily. FOR THE FIRST DAY OF THE FIRST BOTTLE TAKE 4 TABLETS THAT DAY   glimepiride 2 MG tablet Commonly known as:  AMARYL Take 1 tablet (2 mg total) by mouth daily before breakfast.   traMADol 50 MG tablet Commonly known as:  ULTRAM Take 1-2 tablets (50-100 mg total) by mouth every 4 (four) hours as needed for moderate pain.            Durable Medical Equipment  (From admission, onward)         Start     Ordered   08/06/18 1034  For home use only DME Walker rolling  Once    Question:  Patient needs a walker to treat with the following condition  Answer:  Weakness generalized   08/06/18 1033         Follow-up Information    [facility] Follow up on 08/24/2018.   Why:  10:10 for hospital follow up Contact information: [place] [address] [facility] Follow up.   Why:  you can utilized the pharmacy for discounted medications for $4 or $10. Contact information: [place] [address] [phone]       Grace Isaac, MD Follow up on 09/09/2018.   Specialty:  Cardiothoracic Surgery Why:  Appointment is at 3:30, please get CXR at 3:00 at [facility] located on first floor of our office building Contact information: [place] [address] [phone]        Erlene Quan, PA-C Follow up on 08/25/2018.   Specialties:   Cardiology, Radiology Why:  Appointment is at 9:00 Contact information: [address] [place] [address] [phone]           Signed: Ell[facility] Handler 08/07/2018, 9:58 AM

## 2018-08-04 NOTE — Discharge Instructions (Signed)

## 2018-08-04 NOTE — Progress Notes (Signed)
CARDIAC REHAB PHASE I   PRE:  Rate/Rhythm: 75 SR    BP: sitting 98/53    SaO2: 96 RA  MODE:  Ambulation: 400 ft   POST:  Rate/Rhythm: 103 ST    BP: sitting 119/47     SaO2: 96 RA  Pt asleep on arrival but willing to walk. Stood with min assist getting from her back. Steady walking with RW. No major c/o, just sleepy. Return to bed, VSS. [phone]   Harriet Masson CES, ACSM 08/04/2018 3:28 PM

## 2018-08-04 NOTE — Progress Notes (Signed)
Pt received from 2H. VSS. Telemetry applied. Dinner tray reordered. Pt and family oriented to room and unit. Will continue to monitor.  Versie Starks, RN

## 2018-08-05 ENCOUNTER — Inpatient Hospital Stay (HOSPITAL_COMMUNITY): Payer: Medicaid Other

## 2018-08-05 DIAGNOSIS — E8779 Other fluid overload: Secondary | ICD-10-CM

## 2018-08-05 DIAGNOSIS — Z951 Presence of aortocoronary bypass graft: Secondary | ICD-10-CM

## 2018-08-05 LAB — BPAM RBC
Blood Product Expiration Date: 201911252359
Blood Product Expiration Date: 201911282359
Blood Product Expiration Date: 201911292359
Blood Product Expiration Date: 201911292359
Blood Product Expiration Date: 201911292359
Blood Product Expiration Date: 201911292359
ISSUE DATE / TIME: 201911040829
ISSUE DATE / TIME: 201911040829
ISSUE DATE / TIME: 201911041032
ISSUE DATE / TIME: 201911041032
Unit Type and Rh: 6200
Unit Type and Rh: 6200
Unit Type and Rh: 6200
Unit Type and Rh: 6200
Unit Type and Rh: 6200
Unit Type and Rh: 6200

## 2018-08-05 LAB — CBC
HCT: 26.8 % — ABNORMAL LOW (ref 36.0–46.0)
Hemoglobin: 8.4 g/dL — ABNORMAL LOW (ref 12.0–15.0)
MCH: 26.8 pg (ref 26.0–34.0)
MCHC: 31.3 g/dL (ref 30.0–36.0)
MCV: 85.6 fL (ref 80.0–100.0)
Platelets: 129 10*3/uL — ABNORMAL LOW (ref 150–400)
RBC: 3.13 MIL/uL — ABNORMAL LOW (ref 3.87–5.11)
RDW: 13.4 % (ref 11.5–15.5)
WBC: 12.5 10*3/uL — ABNORMAL HIGH (ref 4.0–10.5)
nRBC: 0 % (ref 0.0–0.2)

## 2018-08-05 LAB — TYPE AND SCREEN
ABO/RH(D): AB POS
Antibody Screen: NEGATIVE
Unit division: 0
Unit division: 0
Unit division: 0
Unit division: 0
Unit division: 0
Unit division: 0

## 2018-08-05 LAB — BASIC METABOLIC PANEL
Anion gap: 6 (ref 5–15)
BUN: 15 mg/dL (ref 8–23)
CO2: 24 mmol/L (ref 22–32)
Calcium: 8.1 mg/dL — ABNORMAL LOW (ref 8.9–10.3)
Chloride: 107 mmol/L (ref 98–111)
Creatinine, Ser: 1.36 mg/dL — ABNORMAL HIGH (ref 0.44–1.00)
GFR calc Af Amer: 46 mL/min — ABNORMAL LOW (ref >60)
GFR calc non Af Amer: 40 mL/min — ABNORMAL LOW (ref >60)
Glucose, Bld: 114 mg/dL — ABNORMAL HIGH (ref 70–99)
Potassium: 3.6 mmol/L (ref 3.5–5.1)
Sodium: 137 mmol/L (ref 135–145)

## 2018-08-05 LAB — GLUCOSE, CAPILLARY
GLUCOSE-CAPILLARY: 161 mg/dL — AB (ref 70–99)
GLUCOSE-CAPILLARY: 73 mg/dL (ref 70–99)
Glucose-Capillary: 97 mg/dL (ref 70–99)

## 2018-08-05 MED ORDER — FUROSEMIDE 10 MG/ML IJ SOLN
40.0000 mg | Freq: Once | INTRAMUSCULAR | Status: AC
  Administered 2018-08-05: 40 mg via INTRAVENOUS
  Filled 2018-08-05: qty 4

## 2018-08-05 MED ORDER — GLIMEPIRIDE 4 MG PO TABS: 2.0000 mg | ORAL_TABLET | Freq: Every day | ORAL | Status: DC

## 2018-08-05 MED FILL — Heparin Sodium (Porcine) Inj 1000 Unit/ML: INTRAMUSCULAR | Qty: 30 | Status: CN

## 2018-08-05 MED FILL — Potassium Chloride Inj 2 mEq/ML: INTRAVENOUS | Qty: 40 | Status: CN

## 2018-08-05 MED FILL — Magnesium Sulfate Inj 50%: INTRAMUSCULAR | Qty: 10 | Status: CN

## 2018-08-05 NOTE — Progress Notes (Signed)
EPW pulled per protocol and as ordered. All ends intact. Some resistance met with Atrial wires. Patient reminded to lie supine approximately one hour. Will monitor patient. bp 110/57 heart rate 74 on monitor . Marycruz Boehner, Bettina Gavia RN

## 2018-08-05 NOTE — Progress Notes (Signed)
Patient ambulated in hallway with family. Will monitor patient. Perle Gibbon Jessup RN  

## 2018-08-05 NOTE — Progress Notes (Addendum)
      [address]       [place] [address]             [phone]      3 Days Post-Op Procedure(s) (LRB): CORONARY ARTERY BYPASS GRAFTING (CABG) times 2 using left internal mammary artery to LAD  and left greater saphenous vein to Ramus harvested endoscopically. (N/A) TRANSESOPHAGEAL ECHOCARDIOGRAM (TEE) (N/A)   Subjective:  No new complaints.  Some soreness across her chest.  She is scared about going home.  + ambulation  + BM  Objective: Vital signs in last 24 hours: Temp:  [98 F (36.7 C)-99.2 F (37.3 C)] 98.2 F (36.8 C) (11/07 0514) Pulse Rate:  [77-84] 84 (11/07 0842) Cardiac Rhythm: Normal sinus rhythm (11/07 0700) Resp:  [16-19] 19 (11/07 0514) BP: (89-116)/(50-61) 112/58 (11/07 0514) SpO2:  [96 %-98 %] 96 % (11/07 0514) Weight:  [92.3 kg] 92.3 kg (11/07 0514)  General appearance: alert, cooperative and no distress Heart: regular rate and rhythm Lungs: clear to auscultation bilaterally Abdomen: soft, non-tender; bowel sounds normal; no masses,  no organomegaly Extremities: edema trace Wound: clean and dry, ecchymosis LLE, Right arm  Lab Results: Recent Labs    08/04/18 0229 08/05/18 0315  WBC 11.1* 12.5*  HGB 8.8* 8.4*  HCT 27.9* 26.8*  PLT 106* 129*   BMET:  Recent Labs    08/04/18 0229 08/05/18 0315  NA 135 137  K 3.9 3.6  CL 106 107  CO2 25 24  GLUCOSE 101* 114*  BUN 10 15  CREATININE 1.28* 1.36*  CALCIUM 8.1* 8.1*    PT/INR:  Recent Labs    08/02/18 1303  LABPROT 18.1*  INR 1.51   ABG    Component Value Date/Time   PHART 7.317 (L) 08/02/2018 1914   HCO3 21.3 08/02/2018 1914   TCO2 23 08/02/2018 1914   ACIDBASEDEF 4.0 (H) 08/02/2018 1914   O2SAT 99.0 08/02/2018 1914   CBG (last 3)  Recent Labs    08/04/18 1626 08/04/18 2012 08/05/18 0626  GLUCAP 146* 117* 97    Assessment/Plan: S/P Procedure(s) (LRB): CORONARY ARTERY BYPASS GRAFTING (CABG) times 2 using left internal mammary artery to LAD  and left  greater saphenous vein to Ramus harvested endoscopically. (N/A) TRANSESOPHAGEAL ECHOCARDIOGRAM (TEE) (N/A)  1. CV- NSR, BP controlled- continue Coreg at current dose 2. Pulm- no acute issues, continue IS, CXR with mild bibasilar atelectasis 3. Renal- creatinine remains stable, baseline is 1.3, on Lasix 40 mg daily for 3 doses 4. Expected post operative Thrombocytopenia, improving up to 129 5. DM- sugars controlled, will stop Levemir and restart home Amaryl 6. Dispo- patient stable, maintaining NSR, will d/c EPW, restart home diabetic medications, possibly ready for d/c in next 24-48 hours   LOS: 7 days    Erika Bates 08/05/2018  Mild sob tonight , o2 sat 97 I have seen and examined Erika Bates and agree with the above assessment  and plan.  Delight Ovens MD Beeper [phone] Office [phone] 08/05/2018 7:20 PM

## 2018-08-05 NOTE — Progress Notes (Signed)
Progress Note  Patient Name: Erika Bates Date of Encounter: 08/05/2018  Primary Cardiologist: Nanetta Batty, MD   Subjective   Mild dyspnea; chest sore  Inpatient Medications    Scheduled Meds: . aspirin EC  81 mg Oral Daily  . atorvastatin  80 mg Oral q1800  . carvedilol  3.125 mg Oral BID WC  . clopidogrel  75 mg Oral Daily  . docusate sodium  200 mg Oral Daily  . enoxaparin (LOVENOX) injection  30 mg Subcutaneous QHS  . furosemide  40 mg Oral Daily  . glimepiride  2 mg Oral QAC breakfast  . insulin aspart  0-24 Units Subcutaneous TID AC & HS  . insulin detemir  10 Units Subcutaneous Daily  . pantoprazole  40 mg Oral QAC breakfast  . potassium chloride  10 mEq Oral Daily  . sodium chloride flush  3 mL Intravenous Q12H   Continuous Infusions: . sodium chloride     PRN Meds: sodium chloride, acetaminophen, bisacodyl **OR** bisacodyl, guaiFENesin, ondansetron **OR** ondansetron (ZOFRAN) IV, oxyCODONE, sodium chloride flush, traMADol   Vital Signs    Vitals:   08/04/18 1721 08/04/18 1941 08/05/18 0026 08/05/18 0514  BP: (!) 116/50 (!) 89/61 (!) 101/53 (!) 112/58  Pulse: 77 81 82 77  Resp:  16 19 19   Temp:  99.1 F (37.3 C) 99.2 F (37.3 C) 98.2 F (36.8 C)  TempSrc:  Oral Oral Oral  SpO2:  97% 96% 96%  Weight:    92.3 kg  Height:       No intake or output data in the 24 hours ending 08/05/18 0835 Filed Weights   08/03/18 0500 08/04/18 0600 08/05/18 0514  Weight: 94.7 kg 93.3 kg 92.3 kg    Telemetry    Sinus - Personally Reviewed   Physical Exam   GEN: No acute distress.   Neck: No JVD Cardiac: RRR, no murmurs, rubs, or gallops.  Respiratory: Clear to auscultation bilaterally. S/P sternotomy GI: Soft, nontender, non-distended  MS: trace edema Neuro:  Nonfocal  Psych: Normal affect   Labs    Chemistry Recent Labs  Lab 08/03/18 0327 08/03/18 1814 08/04/18 0229 08/05/18 0315  NA 137  --  135 137  K 4.1  --  3.9 3.6  CL 109  --  106  107  CO2 21*  --  25 24  GLUCOSE 112*  --  101* 114*  BUN 9  --  10 15  CREATININE 1.19* 1.29* 1.28* 1.36*  CALCIUM 7.6*  --  8.1* 8.1*  GFRNONAA 47* 42* 43* 40*  GFRAA 54* 49* 50* 46*  ANIONGAP 7  --  4* 6     Hematology Recent Labs  Lab 08/03/18 1814 08/04/18 0229 08/05/18 0315  WBC 11.4* 11.1* 12.5*  RBC 3.43* 3.22* 3.13*  HGB 9.3* 8.8* 8.4*  HCT 29.7* 27.9* 26.8*  MCV 86.6 86.6 85.6  MCH 27.1 27.3 26.8  MCHC 31.3 31.5 31.3  RDW 13.4 13.3 13.4  PLT 111* 106* 129*    Cardiac Enzymes Recent Labs  Lab 07/29/18 2138 07/30/18 0335 07/30/18 0943  TROPONINI <0.03 0.07* 0.10*    Recent Labs  Lab 07/29/18 1202  TROPIPOC 0.00     Radiology    Dg Chest Port 1 View  Result Date: 08/04/2018 CLINICAL DATA:  Recent CABG. EXAM: PORTABLE CHEST 1 VIEW COMPARISON:  08/03/2018 FINDINGS: Pulmonary artery catheter has been removed. Right jugular central line is still present. Post CABG changes with median sternotomy wires. Mediastinal drain has been  removed. Slightly low lung volumes. Densities in the mid and lower left chest are suggestive for atelectasis. Negative for pneumothorax. Cardiac silhouette is stable. No overt pulmonary edema. IMPRESSION: Slightly decreased lung volumes without pulmonary edema. Support apparatuses as described.  Negative for pneumothorax. Electronically Signed   By: Richarda Overlie M.D.   On: 08/04/2018 09:30    Patient Profile     67 y.o. female with past medical history diabetes mellitus, hypertension, hyperlipidemia, coronary disease admitted with unstable angina.  Cardiac catheterization revealed severe coronary disease and she is now status post coronary artery bypass and graft.  Echocardiogram showed normal LV function.  Assessment & Plan    1 coronary artery disease status post coronary artery bypass and graft-patient progressing well.  Holding sinus rhythm.  Continue aspirin and statin.  2 postoperative volume excess-continue present dose of  Lasix.  3 blood pressure is controlled.  Continue present medications.  4 hyperlipidemia-continue statin.  Check lipids and liver 4 to 6 weeks following discharge.  CARDIOLOGY RECOMMENDATIONS:  Discharge is anticipated in the next 48 hours. Recommendations for medications and follow up:  Discharge Medications: Continue medications as they are currently listed in the [facility].  Follow Up: The patient's Primary Cardiologist is Nanetta Batty, MD  Follow up in the office in 4-6week(s).  Signed,  Olga Millers, MD  8:40 AM 08/05/2018  [facility]  For questions or updates, please contact [facility] Please consult [email or web address] for contact info under        Signed, Olga Millers, MD  08/05/2018, 8:35 AM

## 2018-08-05 NOTE — Progress Notes (Signed)
CARDIAC REHAB PHASE I   PRE:  Rate/Rhythm: 77 SR    BP: sitting 114/61    SaO2: 96 RA  MODE:  Ambulation: 420 ft   POST:  Rate/Rhythm: 110 ST    BP: sitting 131/85     SaO2: 97 RA  Pt moving well. Needed some help getting to EOB. Walked in hall without RW, light assist. Fairly steady but admitted to fatigue and SOB toward end. At this time prefers using RW. Will eval again tomorrow. To recliner, VSS. [phone]   Harriet Masson CES, ACSM 08/05/2018 11:04 AM

## 2018-08-06 LAB — CBC
HCT: 26.7 % — ABNORMAL LOW (ref 36.0–46.0)
Hemoglobin: 8.7 g/dL — ABNORMAL LOW (ref 12.0–15.0)
MCH: 27.7 pg (ref 26.0–34.0)
MCHC: 32.6 g/dL (ref 30.0–36.0)
MCV: 85 fL (ref 80.0–100.0)
Platelets: 191 10*3/uL (ref 150–400)
RBC: 3.14 MIL/uL — ABNORMAL LOW (ref 3.87–5.11)
RDW: 13.7 % (ref 11.5–15.5)
WBC: 10.1 10*3/uL (ref 4.0–10.5)
nRBC: 0 % (ref 0.0–0.2)

## 2018-08-06 LAB — BASIC METABOLIC PANEL
Anion gap: 10 (ref 5–15)
BUN: 18 mg/dL (ref 8–23)
CO2: 25 mmol/L (ref 22–32)
Calcium: 8.4 mg/dL — ABNORMAL LOW (ref 8.9–10.3)
Chloride: 104 mmol/L (ref 98–111)
Creatinine, Ser: 1.58 mg/dL — ABNORMAL HIGH (ref 0.44–1.00)
GFR calc Af Amer: 39 mL/min — ABNORMAL LOW (ref >60)
GFR calc non Af Amer: 33 mL/min — ABNORMAL LOW (ref >60)
Glucose, Bld: 79 mg/dL (ref 70–99)
Potassium: 3.7 mmol/L (ref 3.5–5.1)
Sodium: 139 mmol/L (ref 135–145)

## 2018-08-06 LAB — GLUCOSE, CAPILLARY
GLUCOSE-CAPILLARY: 112 mg/dL — AB (ref 70–99)
GLUCOSE-CAPILLARY: 87 mg/dL (ref 70–99)
GLUCOSE-CAPILLARY: 124 mg/dL — AB (ref 70–99)
Glucose-Capillary: 159 mg/dL — ABNORMAL HIGH (ref 70–99)
Glucose-Capillary: 130 mg/dL — ABNORMAL HIGH (ref 70–99)

## 2018-08-06 MED FILL — Potassium Chloride Inj 2 mEq/ML: INTRAVENOUS | Qty: 20 | Status: AC

## 2018-08-06 MED FILL — Heparin Sodium (Porcine) Inj 1000 Unit/ML: INTRAMUSCULAR | Qty: 30 | Status: AC

## 2018-08-06 MED FILL — Magnesium Sulfate Inj 50%: INTRAMUSCULAR | Qty: 10 | Status: AC

## 2018-08-06 NOTE — Progress Notes (Signed)
Per RN shift shift report patient ambulated early AM.Clif Serio, Randall An RN

## 2018-08-06 NOTE — Progress Notes (Addendum)
      [address]       [name] [address]             [phone]      4 Days Post-Op Procedure(s) (LRB): CORONARY ARTERY BYPASS GRAFTING (CABG) times 2 using left internal mammary artery to LAD  and left greater saphenous vein to Ramus harvested endoscopically. (N/A) TRANSESOPHAGEAL ECHOCARDIOGRAM (TEE) (N/A)   Subjective:  Patient complains of pain along her right thigh.  She states that it is burning along her outer thigh.  + ambulation  + BM  Objective: Vital signs in last 24 hours: Temp:  [97.5 F (36.4 C)-98.4 F (36.9 C)] 97.5 F (36.4 C) (11/08 0513) Pulse Rate:  [71-88] 74 (11/08 0513) Cardiac Rhythm: Normal sinus rhythm (11/08 0608) Resp:  [16-30] 26 (11/08 0513) BP: (96-116)/(54-69) 96/54 (11/08 0513) SpO2:  [96 %-98 %] 98 % (11/08 0513) Weight:  [90 kg] 90 kg (11/08 0513)  Intake/Output from previous day: 11/07 0701 - 11/08 0700 In: 240 [P.O.:240] Out: 2000 [Urine:2000]  General appearance: alert, cooperative and no distress Heart: regular rate and rhythm Lungs: clear to auscultation bilaterally Abdomen: soft, non-tender; bowel sounds normal; no masses,  no organomegaly Extremities: edema trace Wound: clean and dry, ecchymosis BLE  Lab Results: Recent Labs    08/05/18 0315 08/06/18 0328  WBC 12.5* 10.1  HGB 8.4* 8.7*  HCT 26.8* 26.7*  PLT 129* 191   BMET:  Recent Labs    08/05/18 0315 08/06/18 0328  NA 137 139  K 3.6 3.7  CL 107 104  CO2 24 25  GLUCOSE 114* 79  BUN 15 18  CREATININE 1.36* 1.58*  CALCIUM 8.1* 8.4*    PT/INR: No results for input(s): LABPROT, INR in the last 72 hours. ABG    Component Value Date/Time   PHART 7.317 (L) 08/02/2018 1914   HCO3 21.3 08/02/2018 1914   TCO2 23 08/02/2018 1914   ACIDBASEDEF 4.0 (H) 08/02/2018 1914   O2SAT 99.0 08/02/2018 1914   CBG (last 3)  Recent Labs    08/05/18 1631 08/05/18 2107 08/06/18 0616  GLUCAP 73 112* 87    Assessment/Plan: S/P Procedure(s)  (LRB): CORONARY ARTERY BYPASS GRAFTING (CABG) times 2 using left internal mammary artery to LAD  and left greater saphenous vein to Ramus harvested endoscopically. (N/A) TRANSESOPHAGEAL ECHOCARDIOGRAM (TEE) (N/A)  1. CV-NSR, Bp remains labile- continue low dose Coreg 2. Pulm- no acute issues, continue IS 3. Renal- creatinine at 1.58, minimal edema on exam, on Lasix 40 mg daily, K is being supplemented- will stop lasix as she is diuresing well, but her I/O are negative, and BP is labile 4. DM- sugars well controlled, on home amaryl 5. Right thigh parasthesias/burning-no vein was harvested from this leg, possibly due to attempted catheterization access 6. 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  Delight Ovens MD Beeper [phone] Office [phone] 08/06/2018 9:36 AM

## 2018-08-06 NOTE — Progress Notes (Signed)
CARDIAC REHAB PHASE I   PRE:  Rate/Rhythm: up independently with RW    BP: sitting     SaO2:   MODE:  Ambulation: 420 ft   POST:  Rate/Rhythm: 95 SR    BP: sitting 113/63     SaO2: 97 RA  Pt up walking independently with RW. Looks and feels good. Likes RW as she can walk farther and feel more supported. Felt hot and had visual disturbance at end of walk ("like rain coming over my face"). Ok once sitting . She would like a RW for home. Her family cannot be here until tomorrow so will do education then. [phone]   Harriet Masson CES, ACSM 08/06/2018 10:27 AM

## 2018-08-06 NOTE — Progress Notes (Signed)
Patient ambulated in hallway independently back in room now and call bell with in reach will monitor patient. Erika Bates, Randall An RN

## 2018-08-06 NOTE — Progress Notes (Signed)
Patient ambulated in the hallway about 700 ft using front wheel walker tolerated well.

## 2018-08-06 NOTE — Progress Notes (Signed)
Patient ambulated In hallway with walker. Tra Wilemon, Randall An RN

## 2018-08-07 LAB — BASIC METABOLIC PANEL
Anion gap: 8 (ref 5–15)
BUN: 17 mg/dL (ref 8–23)
CALCIUM: 8.1 mg/dL — AB (ref 8.9–10.3)
CHLORIDE: 101 mmol/L (ref 98–111)
CO2: 28 mmol/L (ref 22–32)
CREATININE: 1.31 mg/dL — AB (ref 0.44–1.00)
GFR, EST AFRICAN AMERICAN: 48 mL/min — AB (ref >60)
GFR, EST NON AFRICAN AMERICAN: 42 mL/min — AB (ref >60)
Glucose, Bld: 86 mg/dL (ref 70–99)
Potassium: 3.7 mmol/L (ref 3.5–5.1)
SODIUM: 137 mmol/L (ref 135–145)

## 2018-08-07 LAB — GLUCOSE, CAPILLARY
GLUCOSE-CAPILLARY: 88 mg/dL (ref 70–99)
GLUCOSE-CAPILLARY: 100 mg/dL — AB (ref 70–99)

## 2018-08-07 MED ORDER — ACETAMINOPHEN 325 MG PO TABS: 650.0000 mg | ORAL_TABLET | Freq: Four times a day (QID) | ORAL | Status: DC | PRN

## 2018-08-07 MED ORDER — TRAMADOL HCL 50 MG PO TABS: 50.0000 mg | ORAL_TABLET | ORAL | 0 refills | Status: DC | PRN

## 2018-08-07 NOTE — Progress Notes (Signed)
CARDIAC REHAB PHASE I   D/c ed completed with pt and family. Pt educated to shower daily and monitor incision. Encouraged continued IS use and sternal precautions. Pt given heart healthy and diabetic diets. Reviewed restrictions and exercise guidelines. Will refer to CRP II [place]. Pt for d/c today.  [id number] Reynold Bowen, RN BSN 08/07/2018 11:23 AM

## 2018-08-07 NOTE — Progress Notes (Signed)
..       [address]       [name] [address]             [phone]      5 Days Post-Op Procedure(s) (LRB): CORONARY ARTERY BYPASS GRAFTING (CABG) times 2 using left internal mammary artery to LAD  and left greater saphenous vein to Ramus harvested endoscopically. (N/A) TRANSESOPHAGEAL ECHOCARDIOGRAM (TEE) (N/A)   Subjective:  No new complaints.  Feels good and is ready to go home.  She states the discomfort in her right leg is about the same.  Objective: Vital signs in last 24 hours: Temp:  [98 F (36.7 C)-98.3 F (36.8 C)] 98 F (36.7 C) (11/09 0814) Pulse Rate:  [72-79] 72 (11/08 1954) Cardiac Rhythm: Normal sinus rhythm (11/09 0700) Resp:  [16-30] 18 (11/09 0814) BP: (101-113)/(50-60) 113/52 (11/09 0814) SpO2:  [98 %] 98 % (11/09 0305) Weight:  [90.2 kg] 90.2 kg (11/09 0400)  Intake/Output from previous day: 11/08 0701 - 11/09 0700 In: 840 [P.O.:840] Out: 1050 [Urine:1050]  General appearance: alert, cooperative and no distress Heart: regular rate and rhythm Lungs: clear to auscultation bilaterally Abdomen: soft, non-tender; bowel sounds normal; no masses,  no organomegaly Extremities: edema none Wound: clean and dry  Lab Results: Recent Labs    08/05/18 0315 08/06/18 0328  WBC 12.5* 10.1  HGB 8.4* 8.7*  HCT 26.8* 26.7*  PLT 129* 191   BMET:  Recent Labs    08/06/18 0328 08/07/18 0254  NA 139 137  K 3.7 3.7  CL 104 101  CO2 25 28  GLUCOSE 79 86  BUN 18 17  CREATININE 1.58* 1.31*  CALCIUM 8.4* 8.1*    PT/INR: No results for input(s): LABPROT, INR in the last 72 hours. ABG    Component Value Date/Time   PHART 7.317 (L) 08/02/2018 1914   HCO3 21.3 08/02/2018 1914   TCO2 23 08/02/2018 1914   ACIDBASEDEF 4.0 (H) 08/02/2018 1914   O2SAT 99.0 08/02/2018 1914   CBG (last 3)  Recent Labs    08/06/18 1634 08/06/18 2142 08/07/18 0643  GLUCAP 130* 124* 88    Assessment/Plan: S/P Procedure(s) (LRB): CORONARY ARTERY BYPASS  GRAFTING (CABG) times 2 using left internal mammary artery to LAD  and left greater saphenous vein to Ramus harvested endoscopically. (N/A) TRANSESOPHAGEAL ECHOCARDIOGRAM (TEE) (N/A)  1. CV-NSR, continue low dose Coreg, BP improved after cessation of diuretics 2. Pulm- no acute issues, continue IS 3. Renal- creatinine at baseline, weight is at baseline and remains stable, no diuretics at this time 4. DM- sugars controlled 5. Dispo- patient stable, will d/c home today   LOS: 9 days    Berle Fitz 08/07/2018

## 2018-08-07 NOTE — Progress Notes (Signed)
Patient is ready for discharge. She has all of her belongings with her. She is alert and oriented. Patient has been removed from telemetry CCMD notified. Suture has been removed. IV has been removed without complication, catheter intact. Patient will be transported home by her family members. She will leave unit by wheelchair and meet family at the front entrance of the hospital.

## 2018-08-07 NOTE — Progress Notes (Signed)
Requested RW be delivered to room prior to DC today

## 2018-08-07 NOTE — Progress Notes (Signed)
CARDIOLOGY RECOMMENDATIONS:  Discharge is anticipated in the next 48 hours. Recommendations for medications and follow up:  Discharge Medications: Continue medications as they are currently listed in the [facility]. ASA 81 mg daily, Coreg 3.125 mg BID, , Lipitor 80 mg daily, and Plavix 75 mg daily.  Follow Up: The patient's Primary Cardiologist is Nanetta Batty, MD  Follow up in the office in 2 week(s).  Signed,  Nona Dell, MD  10:58 AM 08/07/2018  [facility]

## 2018-08-09 ENCOUNTER — Telehealth (HOSPITAL_COMMUNITY): Payer: Self-pay

## 2018-08-09 NOTE — Telephone Encounter (Signed)
Attempted to contact pt in regards to CR, spoke to pt daughter Julious Oka who stated pt does not speck English. Also pt does not have any health insurance at this time. Adv we do have a maintenance CR program and the cost of the program, Julious Oka stated that she would like to sign her mother up for the CR maintenance program.   Placed white card in CR maintenance order bin and closed referral

## 2018-08-10 ENCOUNTER — Other Ambulatory Visit: Payer: Self-pay

## 2018-08-10 ENCOUNTER — Telehealth: Payer: Self-pay

## 2018-08-10 DIAGNOSIS — Z951 Presence of aortocoronary bypass graft: Secondary | ICD-10-CM

## 2018-08-10 NOTE — Telephone Encounter (Signed)
Patient's family member contacted regarding patient's medications.  Patient is taking all of her prescribed medications, except Plavix and Glimepiride.  Patient did have refills at patient's preferred pharmacy, [facility].  Made family aware that the medication would be ready for pick up tonight at 6 pm.  She stated she would pick them up today.  Patient is not taking any pain medication other than tylenol and her incisions seem to be healing nicely per family s/p CABG x2 08/02/2018.  Dr. Tyrone Sage advised to have the patient come in to be evaluated with a chest xray tomorrow at 2 pm.  Patient's family made aware. Patient's family made aware to bring all medications she is taking to this appointment.

## 2018-08-11 ENCOUNTER — Encounter: Payer: Self-pay | Admitting: Physician Assistant

## 2018-08-11 ENCOUNTER — Other Ambulatory Visit: Payer: Self-pay

## 2018-08-11 ENCOUNTER — Ambulatory Visit (INDEPENDENT_AMBULATORY_CARE_PROVIDER_SITE_OTHER): Payer: Self-pay | Admitting: Physician Assistant

## 2018-08-11 ENCOUNTER — Ambulatory Visit
Admission: RE | Admit: 2018-08-11 | Discharge: 2018-08-11 | Disposition: A | Discharge status: Home / Self Care | Payer: No Typology Code available for payment source | Source: Ambulatory Visit | Attending: Cardiothoracic Surgery | Admitting: Cardiothoracic Surgery

## 2018-08-11 VITALS — BP 96/56 | HR 58 | Resp 16 | Ht 63.0 in | Wt 198.0 lb

## 2018-08-11 DIAGNOSIS — I2510 Atherosclerotic heart disease of native coronary artery without angina pectoris: Secondary | ICD-10-CM

## 2018-08-11 DIAGNOSIS — Z951 Presence of aortocoronary bypass graft: Secondary | ICD-10-CM

## 2018-08-11 NOTE — Progress Notes (Signed)
Coronary artery bypass grafting x2 with the left internal mammary to the left anterior descending coronary artery and reverse saphenous vein graft to the ramus with left greater saphenous endoscopic vein harvesting.  HPI:  Patient returns for medication follow up. She is s/p CABG x 2 by Dr. Servando Snare on 08/02/2018. Apparently she was taking all of her medications as prescribed except Plavix and Glimepiride. Per her son, she is taking Glimepiride, but Plavix has not been picked up at pharmacy yet. We had a discussion on the importance of obtaining this medication and taking it today. Patient has some dizziness and weakness. Her son states the family is trying to get her on a better diet and she does not like it much.   Current Outpatient Medications  Medication Sig Dispense Refill  . acetaminophen (TYLENOL) 325 MG tablet Take 2 tablets (650 mg total) by mouth every 6 (six) hours as needed for mild pain.    Marland Kitchen aspirin EC 81 MG EC tablet Take 1 tablet (81 mg total) by mouth daily. 30 tablet 11  . atorvastatin (LIPITOR) 80 MG tablet Take 1 tablet (80 mg total) by mouth daily at 6 PM. 30 tablet 11  . blood glucose meter kit and supplies KIT Dispense based on patient and insurance preference. Use up to four times daily as directed. (FOR ICD-9 250.00, 250.01). 1 each 0  . carvedilol (COREG) 3.125 MG tablet Take 1 tablet (3.125 mg total) by mouth 2 (two) times daily with a meal. 60 tablet 11  . clopidogrel (PLAVIX) 75 MG tablet Take 1 tablet (75 mg total) by mouth daily. FOR THE FIRST DAY OF THE FIRST BOTTLE TAKE 4 TABLETS THAT DAY (Patient not taking: Reported on 07/29/2018) 34 tablet 11  . glimepiride (AMARYL) 2 MG tablet Take 1 tablet (2 mg total) by mouth daily before breakfast. 30 tablet 5  . traMADol (ULTRAM) 50 MG tablet Take 1-2 tablets (50-100 mg total) by mouth every 4 (four) hours as needed for moderate pain. 30 tablet 0  Vital Signs: BP 96/56, HR 58, RR 16, Oxygenation 100% on room  air.   Physical Exam: CV-Slightly bradycardic Pulmonary-Clear to auscultation bilaterally Extremities-No LE edema Wounds-Clean and dry. There is an eschar on sternal wound. Right chest tube wound is slightly open but not draining.  Diagnostic Tests: CLINICAL DATA:  CABG surgery on 08/02/2018. Some pain. History of hypertension.  EXAM: CHEST - 2 VIEW  COMPARISON:  08/05/2018 and 08/17/2015.  FINDINGS: Sternal wires vascular remains reflecting recent cardiac surgery. The cardiac silhouette is normal in size. There are left coronary artery stents. No mediastinal widening. No mediastinal or hilar masses or evidence of adenopathy.  Stable prominent granuloma in the left lower lobe superior segment. Lungs otherwise clear. No pleural effusion or pneumothorax.  Skeletal structures are intact.  IMPRESSION: 1. No acute cardiopulmonary disease. No evidence of an operative complication.   Electronically Signed   By: Lajean Manes M.D.   On: 08/11/2018 14:50  Impression and Plan: Overall, she continues to recover from coronary artery bypass grafting surgery. I have stopped Carvedilol as she is both bradycardic and hypotensive. I instructed her son to put this medication and Nitro away from medications she is to take daily. I told son she is not to take Nitro if she has chest pain. She is to call 911. She was instructed she needs to obtain a medical doctor for further surveillance of her diabetes and HGA1C 7.7. She is to continue with sternal precautions (i.e. No lifting  more than 10 pounds until instructed otherwise). She was also instructed to continue using her incentive spirometer.  I will contact patient on Monday to see how she is feeling now that Carvedilol has been stopped. She has a follow up appointment to see the physician assistant with cardiology on 08/25/2018. She will return to see Dr. Servando Snare on 09/09/2018.    Nani Skillern, PA-C [facility] [phone]

## 2018-08-11 NOTE — Patient Instructions (Signed)
Make every effort to keep your diabetes under very tight control.  Follow up closely with your primary care physician or endocrinologist and strive to keep their hemoglobin A1c levels as low as possible, preferably near or below 6.0.  The long term benefits of strict control of diabetes are far reaching and critically important for your overall health and survival.  Continue all previous medications without any changes at this time

## 2018-08-17 ENCOUNTER — Telehealth: Payer: Self-pay

## 2018-08-17 NOTE — Telephone Encounter (Signed)
Left a message on patient's phone to check up on patient per Donielle, PA since last visit at the office where medications were adjusted.  Will await a return call.

## 2018-08-24 ENCOUNTER — Ambulatory Visit (INDEPENDENT_AMBULATORY_CARE_PROVIDER_SITE_OTHER): Payer: Self-pay | Admitting: Physician Assistant

## 2018-08-24 ENCOUNTER — Encounter (INDEPENDENT_AMBULATORY_CARE_PROVIDER_SITE_OTHER): Payer: Self-pay | Admitting: Physician Assistant

## 2018-08-24 VITALS — BP 102/70 | HR 84 | Temp 97.8°F | Resp 18 | Ht 63.0 in | Wt 195.0 lb

## 2018-08-24 DIAGNOSIS — Z09 Encounter for follow-up examination after completed treatment for conditions other than malignant neoplasm: Secondary | ICD-10-CM

## 2018-08-24 DIAGNOSIS — Z951 Presence of aortocoronary bypass graft: Secondary | ICD-10-CM

## 2018-08-24 DIAGNOSIS — Z23 Encounter for immunization: Secondary | ICD-10-CM

## 2018-08-24 DIAGNOSIS — E2839 Other primary ovarian failure: Secondary | ICD-10-CM

## 2018-08-24 DIAGNOSIS — Z1211 Encounter for screening for malignant neoplasm of colon: Secondary | ICD-10-CM

## 2018-08-24 DIAGNOSIS — E119 Type 2 diabetes mellitus without complications: Secondary | ICD-10-CM

## 2018-08-24 DIAGNOSIS — Z1239 Encounter for other screening for malignant neoplasm of breast: Secondary | ICD-10-CM

## 2018-08-24 MED ORDER — GLIMEPIRIDE 4 MG PO TABS: 4.0000 mg | ORAL_TABLET | Freq: Every day | ORAL | 3 refills | Status: DC

## 2018-08-24 NOTE — Progress Notes (Signed)
Subjective:  Patient ID: Erika Bates, female    DOB: 05-14-1951  Age: 67 y.o. MRN: [id number]  CC: hospital f/u  HPI Erika Bates is a 67 y.o. female with a medical history of HTN, NSTEMI, s/p DES x2 to LAD and ramus branch 01/2018, CKD, DM2, presents on hospital discharge follow up. Went to ED nearly one month ago with chest pain. It had been revealed she was not taking Plavix due to affordability issues. She underwent CABG x 2 utilizing LIMA to LAD and SVG to Ramus Intermediate.  The patient also underwent endoscopic harvest of greater saphenous vein from her left leg. She was discharged on 08/07/18. Has followed up with cardiology since her hospital admission/procedure. Last cardiology visit nearly two weeks ago on 08/11/18. It had been revealed during the visit she was still not taking Plavix. Cardiologist stopped Carvedilol due to bradycardia and hypotension. She was advised not to take Nitro should she encounter chest pain but rather call 911. Lastly she was advised to have better control of DM2. Last A1c 7.7% three weeks ago which is decreased from 8.4% six months ago. States she is feeling well except for mild left sided chest pain likely attributed to her CABG. Does not endorse any other symptoms or complaints.      Outpatient Medications Prior to Visit  Medication Sig Dispense Refill  . acetaminophen (TYLENOL) 325 MG tablet Take 2 tablets (650 mg total) by mouth every 6 (six) hours as needed for mild pain.    Marland Kitchen aspirin EC 81 MG EC tablet Take 1 tablet (81 mg total) by mouth daily. 30 tablet 11  . atorvastatin (LIPITOR) 80 MG tablet Take 1 tablet (80 mg total) by mouth daily at 6 PM. 30 tablet 11  . blood glucose meter kit and supplies KIT Dispense based on patient and insurance preference. Use up to four times daily as directed. (FOR ICD-9 250.00, 250.01). 1 each 0  . carvedilol (COREG) 3.125 MG tablet Take 1 tablet (3.125 mg total) by mouth 2 (two) times daily with a meal. 60 tablet  11  . clopidogrel (PLAVIX) 75 MG tablet Take 1 tablet (75 mg total) by mouth daily. FOR THE FIRST DAY OF THE FIRST BOTTLE TAKE 4 TABLETS THAT DAY 34 tablet 11  . glimepiride (AMARYL) 2 MG tablet Take 1 tablet (2 mg total) by mouth daily before breakfast. 30 tablet 5  . traMADol (ULTRAM) 50 MG tablet Take 1-2 tablets (50-100 mg total) by mouth every 4 (four) hours as needed for moderate pain. (Patient not taking: Reported on 08/11/2018) 30 tablet 0   No facility-administered medications prior to visit.      ROS Review of Systems  Constitutional: Negative for chills, fever and malaise/fatigue.  Eyes: Negative for blurred vision.  Respiratory: Negative for shortness of breath.   Cardiovascular: Positive for chest pain (mild). Negative for palpitations.  Gastrointestinal: Negative for abdominal pain and nausea.  Genitourinary: Negative for dysuria and hematuria.  Musculoskeletal: Negative for joint pain and myalgias.  Skin: Negative for rash.  Neurological: Negative for tingling and headaches.  Psychiatric/Behavioral: Negative for depression. The patient is not nervous/anxious.     Objective:  BP 102/70 (BP Location: Right Arm, Patient Position: Sitting, Cuff Size: Large)   Pulse 84   Temp 97.8 F (36.6 C) (Oral)   Resp 18   Ht 5' 3"  (1.6 m)   Wt 195 lb (88.5 kg)   SpO2 100%   BMI 34.54 kg/m   BP/Weight 08/24/2018 08/11/2018 08/07/2018  Systolic BP 793 96 903  Diastolic BP 70 56 52  Wt. (Lbs) 195 198 198.86  BMI 34.54 35.07 35.23      Physical Exam  Constitutional: She is oriented to person, place, and time.  Well developed, obese, NAD, polite  HENT:  Head: Normocephalic and atraumatic.  Eyes: No scleral icterus.  Neck: Normal range of motion. Neck supple. No thyromegaly present.  Cardiovascular: Normal rate, regular rhythm and normal heart sounds.  Pulmonary/Chest: Effort normal and breath sounds normal.  Musculoskeletal: She exhibits no edema.  Neurological: She is  alert and oriented to person, place, and time.  Skin: Skin is warm and dry. No rash noted. No erythema. No pallor.  Psychiatric: She has a normal mood and affect. Her behavior is normal. Thought content normal.  Vitals reviewed.    Assessment & Plan:   1. Type 2 diabetes mellitus without complication, without long-term current use of insulin (HCC) - Increase glimepiride (AMARYL) 4 MG tablet; Take 1 tablet (4 mg total) by mouth daily before breakfast.  Dispense: 30 tablet; Refill: 3  2. Need for Tdap vaccination - Tdap vaccine greater than or equal to 7yo IM  3. Need for prophylactic vaccination and inoculation against influenza - Flu Vaccine QUAD 6+ mos PF IM (Fluarix Quad PF)  4. Estrogen deficiency - DG Bone Density; Future  5. Special screening for malignant neoplasms, colon - Fecal occult blood, imunochemical  6. Screening for breast cancer - MM DIGITAL SCREENING BILATERAL; Future   Meds ordered this encounter  Medications  . glimepiride (AMARYL) 4 MG tablet    Sig: Take 1 tablet (4 mg total) by mouth daily before breakfast.    Dispense:  30 tablet    Refill:  3    Order Specific Question:   Supervising Provider    Answer:   Charlott Rakes [[id number]]    Follow-up: 3 months for DM2 f/u   Clent Demark PA

## 2018-08-24 NOTE — Patient Instructions (Signed)
Diabetes Mellitus and Nutrition When you have diabetes (diabetes mellitus), it is very important to have healthy eating habits because your blood sugar (glucose) levels are greatly affected by what you eat and drink. Eating healthy foods in the appropriate amounts, at about the same times every day, can help you:  Control your blood glucose.  Lower your risk of heart disease.  Improve your blood pressure.  Reach or maintain a healthy weight.  Every person with diabetes is different, and each person has different needs for a meal plan. Your health care provider may recommend that you work with a diet and nutrition specialist (dietitian) to make a meal plan that is best for you. Your meal plan may vary depending on factors such as:  The calories you need.  The medicines you take.  Your weight.  Your blood glucose, blood pressure, and cholesterol levels.  Your activity level.  Other health conditions you have, such as heart or kidney disease.  How do carbohydrates affect me? Carbohydrates affect your blood glucose level more than any other type of food. Eating carbohydrates naturally increases the amount of glucose in your blood. Carbohydrate counting is a method for keeping track of how many carbohydrates you eat. Counting carbohydrates is important to keep your blood glucose at a healthy level, especially if you use insulin or take certain oral diabetes medicines. It is important to know how many carbohydrates you can safely have in each meal. This is different for every person. Your dietitian can help you calculate how many carbohydrates you should have at each meal and for snack. Foods that contain carbohydrates include:  Bread, cereal, rice, pasta, and crackers.  Potatoes and corn.  Peas, beans, and lentils.  Milk and yogurt.  Fruit and juice.  Desserts, such as cakes, cookies, ice cream, and candy.  How does alcohol affect me? Alcohol can cause a sudden decrease in blood  glucose (hypoglycemia), especially if you use insulin or take certain oral diabetes medicines. Hypoglycemia can be a life-threatening condition. Symptoms of hypoglycemia (sleepiness, dizziness, and confusion) are similar to symptoms of having too much alcohol. If your health care provider says that alcohol is safe for you, follow these guidelines:  Limit alcohol intake to no more than 1 drink per day for nonpregnant women and 2 drinks per day for men. One drink equals 12 oz of beer, 5 oz of wine, or 1 oz of hard liquor.  Do not drink on an empty stomach.  Keep yourself hydrated with water, diet soda, or unsweetened iced tea.  Keep in mind that regular soda, juice, and other mixers may contain a lot of sugar and must be counted as carbohydrates.  What are tips for following this plan? Reading food labels  Start by checking the serving size on the label. The amount of calories, carbohydrates, fats, and other nutrients listed on the label are based on one serving of the food. Many foods contain more than one serving per package.  Check the total grams (g) of carbohydrates in one serving. You can calculate the number of servings of carbohydrates in one serving by dividing the total carbohydrates by 15. For example, if a food has 30 g of total carbohydrates, it would be equal to 2 servings of carbohydrates.  Check the number of grams (g) of saturated and trans fats in one serving. Choose foods that have low or no amount of these fats.  Check the number of milligrams (mg) of sodium in one serving. Most people   should limit total sodium intake to less than 2,300 mg per day.  Always check the nutrition information of foods labeled as "low-fat" or "nonfat". These foods may be higher in added sugar or refined carbohydrates and should be avoided.  Talk to your dietitian to identify your daily goals for nutrients listed on the label. Shopping  Avoid buying canned, premade, or processed foods. These  foods tend to be high in fat, sodium, and added sugar.  Shop around the outside edge of the grocery store. This includes fresh fruits and vegetables, bulk grains, fresh meats, and fresh dairy. Cooking  Use low-heat cooking methods, such as baking, instead of high-heat cooking methods like deep frying.  Cook using healthy oils, such as olive, canola, or sunflower oil.  Avoid cooking with butter, cream, or high-fat meats. Meal planning  Eat meals and snacks regularly, preferably at the same times every day. Avoid going long periods of time without eating.  Eat foods high in fiber, such as fresh fruits, vegetables, beans, and whole grains. Talk to your dietitian about how many servings of carbohydrates you can eat at each meal.  Eat 4-6 ounces of lean protein each day, such as lean meat, chicken, fish, eggs, or tofu. 1 ounce is equal to 1 ounce of meat, chicken, or fish, 1 egg, or 1/4 cup of tofu.  Eat some foods each day that contain healthy fats, such as avocado, nuts, seeds, and fish. Lifestyle   Check your blood glucose regularly.  Exercise at least 30 minutes 5 or more days each week, or as told by your health care provider.  Take medicines as told by your health care provider.  Do not use any products that contain nicotine or tobacco, such as cigarettes and e-cigarettes. If you need help quitting, ask your health care provider.  Work with a counselor or diabetes educator to identify strategies to manage stress and any emotional and social challenges. What are some questions to ask my health care provider?  Do I need to meet with a diabetes educator?  Do I need to meet with a dietitian?  What number can I call if I have questions?  When are the best times to check my blood glucose? Where to find more information:  American Diabetes Association: diabetes.org/food-and-fitness/food  Academy of Nutrition and Dietetics:  www.eatright.org/resources/health/diseases-and-conditions/diabetes  National Institute of Diabetes and Digestive and Kidney Diseases (NIH): www.niddk.nih.gov/health-information/diabetes/overview/diet-eating-physical-activity Summary  A healthy meal plan will help you control your blood glucose and maintain a healthy lifestyle.  Working with a diet and nutrition specialist (dietitian) can help you make a meal plan that is best for you.  Keep in mind that carbohydrates and alcohol have immediate effects on your blood glucose levels. It is important to count carbohydrates and to use alcohol carefully. This information is not intended to replace advice given to you by your health care provider. Make sure you discuss any questions you have with your health care provider. Document Released: 06/12/2005 Document Revised: 10/20/2016 Document Reviewed: 10/20/2016 Elsevier Interactive Patient Education  2018 Elsevier Inc.  

## 2018-08-25 ENCOUNTER — Ambulatory Visit: Payer: Self-pay | Admitting: Cardiology

## 2018-09-02 ENCOUNTER — Encounter: Payer: Self-pay | Admitting: Cardiology

## 2018-09-02 ENCOUNTER — Ambulatory Visit (INDEPENDENT_AMBULATORY_CARE_PROVIDER_SITE_OTHER): Payer: Self-pay | Admitting: Cardiology

## 2018-09-02 VITALS — BP 110/66 | HR 65 | Ht 63.0 in | Wt 197.0 lb

## 2018-09-02 DIAGNOSIS — N183 Chronic kidney disease, stage 3 unspecified: Secondary | ICD-10-CM

## 2018-09-02 DIAGNOSIS — E785 Hyperlipidemia, unspecified: Secondary | ICD-10-CM

## 2018-09-02 DIAGNOSIS — E119 Type 2 diabetes mellitus without complications: Secondary | ICD-10-CM

## 2018-09-02 DIAGNOSIS — I214 Non-ST elevation (NSTEMI) myocardial infarction: Secondary | ICD-10-CM

## 2018-09-02 LAB — BASIC METABOLIC PANEL
BUN/Creatinine Ratio: 14 (ref 12–28)
BUN: 17 mg/dL (ref 8–27)
CO2: 22 mmol/L (ref 20–29)
Calcium: 9.4 mg/dL (ref 8.7–10.3)
Chloride: 105 mmol/L (ref 96–106)
Creatinine, Ser: 1.2 mg/dL — ABNORMAL HIGH (ref 0.57–1.00)
GFR calc Af Amer: 55 mL/min/{1.73_m2} — ABNORMAL LOW (ref >59)
GFR calc non Af Amer: 48 mL/min/{1.73_m2} — ABNORMAL LOW (ref >59)
Glucose: 57 mg/dL — ABNORMAL LOW (ref 65–99)
Potassium: 4.6 mmol/L (ref 3.5–5.2)
Sodium: 142 mmol/L (ref 134–144)

## 2018-09-02 LAB — CBC
Hematocrit: 36.1 % (ref 34.0–46.6)
Hemoglobin: 11.2 g/dL (ref 11.1–15.9)
MCH: 26.1 pg — ABNORMAL LOW (ref 26.6–33.0)
MCHC: 31 g/dL — ABNORMAL LOW (ref 31.5–35.7)
MCV: 84 fL (ref 79–97)
Platelets: 306 10*3/uL (ref 150–450)
RBC: 4.29 x10E6/uL (ref 3.77–5.28)
RDW: 12.7 % (ref 12.3–15.4)
WBC: 7.4 10*3/uL (ref 3.4–10.8)

## 2018-09-02 NOTE — Patient Instructions (Addendum)
Medication Instructions:   Your physician recommends that you continue on your current medications as directed. Please refer to the Current Medication list given to you today.   If you need a refill on your cardiac medications before your next appointment, please call your pharmacy.   Lab work: CBC AND BMET TODAY    If you have labs (blood work) drawn today and your tests are completely normal, you will receive your results only by: Marland Kitchen MyChart Message (if you have MyChart) OR . A paper copy in the mail If you have any lab test that is abnormal or we need to change your treatment, we will call you to review the results.  Testing/Procedures: NONE ORDERED  TODAY  Follow-Up:  WITH KILROY  IN  [facility] OF 2020  At [facility], you and your health needs are our priority.  As part of our continuing mission to provide you with exceptional heart care, we have created designated Provider Care Teams.  These Care Teams include your primary Cardiologist (physician) and Advanced Practice Providers (APPs -  Physician Assistants and Nurse Practitioners) who all work together to provide you with the care you need, when you need it. .   Any Other Special Instructions Will Be Listed Below (If Applicable).

## 2018-09-02 NOTE — Assessment & Plan Note (Signed)
On high dose statin Rx 

## 2018-09-02 NOTE — Progress Notes (Signed)
  09/02/2018 Audrionna Wenig   05/08/1951  [id number]  Primary Physician Gomez, Roger David, PA-C Primary Cardiologist: Dr Berry  HPI: Ms. Erika Bates is a pleasant 67-year-old female from [place].  We had seen her in May 2019 when she presented with a non-ST elevation MI.  At that time she had an LAD and ramus intermedius intervention with drug-eluting stents.  Her LV function was normal.  Post MI she was being transitioned from Brilinta to Plavix but there was some confusion and she ended up not getting her Plavix.  She was readmitted to the hospital 07/29/2018 with chest pain and again ruled in for a non-ST elevation MI.  Catheterization revealed progression of coronary disease.  Is elected to proceed with bypass surgery and on August 02, 2018 she underwent bypass grafting x2 with an LIMA to the LAD and SVG to the ramus intermedius.  She tolerated this well.  Her LV function was normal.  Other medical problems include essential hypertension, non-insulin-dependent diabetes, and dyslipidemia.  She is in the office today for follow-up.  She was discharged on low-dose carvedilol.  She did not feel well after discharge and saw Doniell Zimmerman PA-C with CV[facility] on 08/11/2018.  She was noted to be a little bit hypotensive and bradycardic and her carvedilol was stopped.  Since then she is done somewhat better.  Her daughter says she does not do much at home and we encouraged her to walk for 15 or 20 minutes 3 or 4 times a day.  The patient's appetite is still a little depressed and she is not sleeping well, I assured them that that would improve.  She has not had tachycardia or unusual dyspnea.   Current Outpatient Medications  Medication Sig Dispense Refill  . acetaminophen (TYLENOL) 325 MG tablet Take 2 tablets (650 mg total) by mouth every 6 (six) hours as needed for mild pain.    . aspirin EC 81 MG EC tablet Take 1 tablet (81 mg total) by mouth daily. 30 tablet 11  . atorvastatin (LIPITOR) 80 MG tablet  Take 1 tablet (80 mg total) by mouth daily at 6 PM. 30 tablet 11  . blood glucose meter kit and supplies KIT Dispense based on patient and insurance preference. Use up to four times daily as directed. (FOR ICD-9 250.00, 250.01). 1 each 0  . clopidogrel (PLAVIX) 75 MG tablet Take 1 tablet (75 mg total) by mouth daily. FOR THE FIRST DAY OF THE FIRST BOTTLE TAKE 4 TABLETS THAT DAY 34 tablet 11  . glimepiride (AMARYL) 4 MG tablet Take 1 tablet (4 mg total) by mouth daily before breakfast. 30 tablet 3   No current facility-administered medications for this visit.     No Known Allergies  Past Medical History:  Diagnosis Date  . Dialysis patient (HCC) 1990-1991  . Hypertension   . Myocardial infarction (HCC) 01/2018  . Pre-diabetes     Social History   Socioeconomic History  . Marital status: Significant Other    Spouse name: Not on file  . Number of children: Not on file  . Years of education: Not on file  . Highest education level: Not on file  Occupational History  . Not on file  Social Needs  . Financial resource strain: Not on file  . Food insecurity:    Worry: Not on file    Inability: Not on file  . Transportation needs:    Medical: Not on file    Non-medical: Not on file  Tobacco Use  .   Smoking status: Never Smoker  . Smokeless tobacco: Never Used  Substance and Sexual Activity  . Alcohol use: No  . Drug use: No  . Sexual activity: Not Currently  Lifestyle  . Physical activity:    Days per week: Not on file    Minutes per session: Not on file  . Stress: Not on file  Relationships  . Social connections:    Talks on phone: Not on file    Gets together: Not on file    Attends religious service: Not on file    Active member of club or organization: Not on file    Attends meetings of clubs or organizations: Not on file    Relationship status: Not on file  . Intimate partner violence:    Fear of current or ex partner: Not on file    Emotionally abused: Not on file     Physically abused: Not on file    Forced sexual activity: Not on file  Other Topics Concern  . Not on file  Social History Narrative  . Not on file     Family History  Problem Relation Age of Onset  . Hypertension Father      Review of Systems: General: negative for chills, fever, night sweats or weight changes.  Cardiovascular: negative for chest pain, dyspnea on exertion, edema, orthopnea, palpitations, paroxysmal nocturnal dyspnea or shortness of breath Dermatological: negative for rash Respiratory: negative for cough or wheezing Urologic: negative for hematuria Abdominal: negative for nausea, vomiting, diarrhea, bright red blood per rectum, melena, or hematemesis Neurologic: negative for visual changes, syncope, or dizziness All other systems reviewed and are otherwise negative except as noted above.    Blood pressure 110/66, pulse 65, height 5' 3" (1.6 m), weight 197 lb (89.4 kg).  General appearance: alert, cooperative and no distress Neck: no carotid bruit and no JVD Lungs: few crackles lt base Heart: regular rate and rhythm Extremities: no edema Neurologic: Grossly normal  EKG NSR- 65, Q V2  ASSESSMENT AND PLAN:   Non-ST elevation (NSTEMI) myocardial infarction (HCC) Pt presented with NSTEMI 02/15/18 and again 07/29/18 secondary to inadvertent non compliance with Plavix  S/P CABG x 2 LIMA-LAD, SVG-RI  08/03/18-NL LVF  CRI (chronic renal insufficiency), stage 3 (moderate) (HCC) Airlifted to [place] for dialysis in 1990s- Scr 1.31, GFR 44 at discharge Nov 2019  Hyperlipidemia LDL goal <70 On high dose statin Rx  Non-insulin dependent type 2 diabetes mellitus (HCC) On Amaryl   PLAN we will check CBC and BMP today.  I did not change her medical therapy.  We will see her back in a couple months and check lipids and to see med at that time as well.  Luke Kilroy PA-C 09/02/2018 9:40 AM 

## 2018-09-02 NOTE — Assessment & Plan Note (Signed)
LIMA-LAD, SVG-RI  08/03/18-NL LVF

## 2018-09-02 NOTE — Assessment & Plan Note (Signed)
Airlifted to [facility] for dialysis in 1990s- Scr 1.31, GFR 44 at discharge Nov 2019

## 2018-09-02 NOTE — Assessment & Plan Note (Signed)
On Amaryl 

## 2018-09-02 NOTE — Assessment & Plan Note (Signed)
Pt presented with NSTEMI 02/15/18 and again 07/29/18 secondary to inadvertent non compliance with Plavix

## 2018-09-09 ENCOUNTER — Other Ambulatory Visit: Payer: Self-pay | Admitting: Cardiothoracic Surgery

## 2018-09-09 ENCOUNTER — Ambulatory Visit: Payer: Self-pay | Admitting: Cardiothoracic Surgery

## 2018-09-09 DIAGNOSIS — Z951 Presence of aortocoronary bypass graft: Secondary | ICD-10-CM

## 2018-09-24 ENCOUNTER — Telehealth (HOSPITAL_COMMUNITY): Payer: Self-pay | Admitting: *Deleted

## 2018-10-25 ENCOUNTER — Ambulatory Visit: Payer: Self-pay

## 2018-10-28 ENCOUNTER — Ambulatory Visit: Payer: Self-pay | Admitting: Cardiothoracic Surgery

## 2018-11-08 ENCOUNTER — Ambulatory Visit (INDEPENDENT_AMBULATORY_CARE_PROVIDER_SITE_OTHER): Payer: Self-pay | Admitting: Surgical

## 2018-11-08 ENCOUNTER — Ambulatory Visit
Admission: RE | Admit: 2018-11-08 | Discharge: 2018-11-08 | Disposition: A | Discharge status: Home / Self Care | Payer: No Typology Code available for payment source | Source: Ambulatory Visit | Attending: Cardiothoracic Surgery | Admitting: Cardiothoracic Surgery

## 2018-11-08 VITALS — BP 114/72 | HR 72 | Resp 20 | Ht 63.0 in | Wt 205.0 lb

## 2018-11-08 DIAGNOSIS — Z951 Presence of aortocoronary bypass graft: Secondary | ICD-10-CM

## 2018-11-08 DIAGNOSIS — I2510 Atherosclerotic heart disease of native coronary artery without angina pectoris: Secondary | ICD-10-CM

## 2018-11-08 NOTE — Progress Notes (Signed)
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  SURGICAL PROCEDURE:  Coronary artery bypass grafting x2 with the left internal mammary to the left anterior descending coronary artery and reverse saphenous vein graft to the ramus with left greater saphenous endoscopic vein harvesting.  SURGEON:  Lanelle Bal, MD  FIRST ASSISTANT:  Ellwood Handler, PA-C   History of Present Illness:    The patient is a 68 year old female status post the above described procedure seen in the office on today's date and postsurgical follow-up.  Overall she reports that she feels well.  She does have a slightly productive cough occasionally.  She denies fevers, chills or other constitutional symptoms.  She denies chest pain other than some incisional discomfort at the cephalad portion which is intermittent.  She does get occasional dyspnea with exertion but is able to do most of her routine activities without shortness of breath.  She has had no significant difficulties with her incisions.  She has not had any palpitations.  She has some mild lower extremity edema.  Overall she is pleased with her progress and is anxious to return home this summer.  She has been taking her Plavix which was an issue in the previous visit.  She is also followed up with both cardiology as well as her primary physician assistant who adjusted her diabetes medications.  She is  fairly noncompliant with her diet and eats a lot of native foods including rice and bread.  The patient does not drive nor work currently.      Past Medical History:  Diagnosis Date  . Dialysis patient [facility]) [phone]  . Hypertension   . Myocardial infarction ([place]) 01/2018  . Pre-diabetes      Social History   Tobacco Use  Smoking Status Never Smoker  Smokeless Tobacco Never Used    Social History   Substance and Sexual Activity  Alcohol Use No     No Known Allergies  Current Outpatient Medications  Medication Sig Dispense Refill  . acetaminophen (TYLENOL) 325 MG tablet Take 2 tablets (650 mg total) by mouth every 6 (six) hours as needed for mild pain.    Marland Kitchen aspirin EC 81 MG EC tablet Take 1 tablet (81 mg total) by mouth daily. 30 tablet 11  . atorvastatin (LIPITOR) 80 MG tablet Take 1 tablet (80 mg total) by mouth daily at 6 PM. 30 tablet 11  . blood glucose meter kit and supplies KIT Dispense based on patient and insurance preference. Use up to four times daily as directed. (FOR ICD-9 250.00, 250.01). 1 each 0  . clopidogrel (PLAVIX) 75 MG tablet Take 1 tablet (75 mg total) by mouth daily. FOR THE FIRST DAY OF THE FIRST BOTTLE TAKE 4 TABLETS THAT DAY 34 tablet 11  . glimepiride (AMARYL) 4 MG tablet Take 1 tablet (4 mg total) by mouth daily before breakfast. 30 tablet 3  No current facility-administered medications for this visit.        Physical Exam: BP 114/72   Pulse 72   Resp 20   Ht 5' 3"  (1.6 m)   Wt 93 kg   SpO2 94%   BMI 36.31 kg/m   General appearance: alert, cooperative and no distress Heart: regular rate and rhythm Lungs: clear to auscultation bilaterally Abdomen: Obese, soft, nontender, nondistended Extremities: Trace ankle edema Wound: Incisions all well-healed without evidence of infection   Diagnostic Studies & Laboratory data:     Recent Radiology Findings:   Dg Chest 2 View  Result Date: 11/08/2018 CLINICAL DATA:  Dry cough.   History of CABG. EXAM: CHEST - 2 VIEW COMPARISON:  08/11/2018 FINDINGS: Stable postsurgical changes from CABG. Cardiomediastinal silhouette is normal. Mediastinal contours appear intact. There is no evidence of focal airspace consolidation, pleural effusion or pneumothorax. 17 mm soft tissue mass in the left lower lobe is stable from 2016, thought to represent a partially calcified granuloma in correlation to prior cross-sectional imaging. Osseous structures are without acute abnormality. Soft tissues are grossly normal. IMPRESSION: No active cardiopulmonary disease. Electronically Signed   By: Fidela Salisbury M.D.   On: 11/08/2018 13:12      Recent Lab Findings: Lab Results  Component Value Date   WBC 7.4 09/02/2018   HGB 11.2 09/02/2018   HCT 36.1 09/02/2018   PLT 306 09/02/2018   GLUCOSE 57 (L) 09/02/2018   CHOL 91 07/30/2018   TRIG 134 07/30/2018   HDL 26 (L) 07/30/2018   LDLCALC 38 07/30/2018   ALT 14 04/28/2018   AST 16 04/28/2018   NA 142 09/02/2018   K 4.6 09/02/2018   CL 105 09/02/2018   CREATININE 1.20 (H) 09/02/2018   BUN 17 09/02/2018   CO2 22 09/02/2018   INR 1.51 08/02/2018   HGBA1C 7.7 (H) 08/02/2018      Assessment / Plan: The patient is doing well status post her CABG in November 2019.  She is taking her medication as prescribed currently.  There are no current surgically related issues.  We will see her again on a as needed basis for any surgically related issues or at request.  I have made no changes to her current medication regimens.  She also understands that she needs to lower the amount of carbohydrates and sugars in her diet.        Erika Giovanni, PA-C 11/08/2018 1:51 PM

## 2018-11-08 NOTE — Patient Instructions (Signed)
Discussed routine activity progression.  Also discussed nutritional diabetic management.

## 2018-11-23 ENCOUNTER — Ambulatory Visit (INDEPENDENT_AMBULATORY_CARE_PROVIDER_SITE_OTHER): Payer: Self-pay | Admitting: Primary Care

## 2018-11-23 ENCOUNTER — Telehealth: Payer: Self-pay

## 2018-11-23 NOTE — Telephone Encounter (Signed)
Patient's daughter contacted in regards to an appointment for patient today.  Advised patient does not have an appointment with Dr. Tyrone Sage however she has an appointment with her PCP.  Gave name and clarification.  She acknowledged receipt.

## 2019-02-10 ENCOUNTER — Other Ambulatory Visit: Payer: Self-pay | Admitting: Emergency Medicine

## 2019-02-10 ENCOUNTER — Telehealth: Payer: Self-pay | Admitting: Physician Assistant

## 2019-02-10 DIAGNOSIS — E119 Type 2 diabetes mellitus without complications: Secondary | ICD-10-CM

## 2019-02-10 MED ORDER — GLIMEPIRIDE 4 MG PO TABS: 4.0000 mg | ORAL_TABLET | Freq: Every day | ORAL | 0 refills | Status: DC

## 2019-02-10 NOTE — Telephone Encounter (Signed)
1) Medication(s) Requested (by name): glimepiride 2) Pharmacy of Choice: [facility] - [place], [place] - [address]  Would like a 90 day supply

## 2019-03-08 ENCOUNTER — Other Ambulatory Visit: Payer: Self-pay | Admitting: Pharmacist

## 2019-03-08 MED ORDER — ATORVASTATIN CALCIUM 80 MG PO TABS: 80.0000 mg | ORAL_TABLET | Freq: Every day | ORAL | 0 refills | Status: DC

## 2019-03-14 ENCOUNTER — Telehealth: Payer: Self-pay | Admitting: *Deleted

## 2019-03-14 NOTE — Telephone Encounter (Signed)
A message was left, re: follow up visit. 

## 2019-04-16 ENCOUNTER — Other Ambulatory Visit: Payer: Self-pay | Admitting: Family Medicine

## 2019-04-25 ENCOUNTER — Telehealth (INDEPENDENT_AMBULATORY_CARE_PROVIDER_SITE_OTHER): Payer: Self-pay

## 2019-04-25 NOTE — Telephone Encounter (Signed)
Open in error

## 2019-05-01 NOTE — Progress Notes (Signed)
Cardiology Office Note   Date:  05/02/2019   ID:  Jodee Wagenaar, DOB [birth year], MRN [id number]  PCP:  Clent Demark, PA-C  Cardiologist: Avelina Laine  CC: Follow Up   History of Present Illness: Erika Bates is a 68 y.o. female from [place] who presents for ongoing assessment and management of coronary artery disease, history of NSTEMI in May 2019 with stenosis of the LAD and ramus intermediate, with intervention with drug-eluting stents.  LV function was normal.  The patient was initially started on Brilinta, but there was confusion over the medication and therefore she was transitioned to Plavix.  Unfortunately, the patient was readmitted to the hospital with another NSTEMI in October 2019 which revealed progression of coronary disease and therefore was recommended for CABG which was completed on August 02, 2018 with LIMA to LAD and SVG to ramus intermedius.  The patient did well concerning her surgery and postoperative course.  Other history includes non-insulin-dependent diabetes and hypertension along with dyslipidemia.  She was seen last by Kerin Ransom, PA, on 09/02/2018.  She was feeling tired on low-dose carvedilol.  At the time she was not very active and was encouraged to increase her activity slowly walking up to 15 minutes a day.  A follow-up CBC and a BMP was ordered she was continued on her current medication regimen.  She is here for follow-up.  She was followed up by Jadene Pierini, PA with [facility] in February 2020 and was noted that she was noncompliant with diabetic diet.  She also had some mild lower extremity edema.  She comes today with her daughter with multiple complaints of musculoskeletal pain.  Neck and shoulders, along with both wrists and hands.  Patient is very sedentary doing a lot of craft work using her hands, doing minute work with fingers on her projects.  She is complaining of some lower extremity edema.  Her daughter who is with her believes is because she is  hunched over so much working on her crafts and not getting up and walking very much.  She denies any chest pain, she does have some mild dyspnea on exertion but not severe.  She is medically compliant but continues to struggle with a diabetic diet.  Past Medical History:  Diagnosis Date  . Dialysis patient [facility]) [phone]  . Hypertension   . Myocardial infarction ([place]) 01/2018  . Pre-diabetes     Past Surgical History:  Procedure Laterality Date  . ABDOMINAL EXPLORATION SURGERY  >1990   "they thought it was problems w/my gallbladder; said it was my pancreas but they didn't take that out" (02/16/2018)  . CARDIAC CATHETERIZATION  07/29/2018  . CORONARY ARTERY BYPASS GRAFT N/A 08/02/2018   Procedure: CORONARY ARTERY BYPASS GRAFTING (CABG) times 2 using left internal mammary artery to LAD  and left greater saphenous vein to Ramus harvested endoscopically.;  Surgeon: Grace Isaac, MD;  Location: [place];  Service: Open Heart Surgery;  Laterality: N/A;  . CORONARY STENT INTERVENTION N/A 02/16/2018   Procedure: CORONARY STENT INTERVENTION;  Surgeon: Leonie Man, MD;  Location: [place] CV [facility];  Service: Cardiovascular;  Laterality: N/A;  . LEFT HEART CATH AND CORONARY ANGIOGRAPHY N/A 02/16/2018   Procedure: LEFT HEART CATH AND CORONARY ANGIOGRAPHY;  Surgeon: Leonie Man, MD;  Location: [place] CV LAB;  Service: Cardiovascular;  Laterality: N/A;  . LEFT HEART CATH AND CORONARY ANGIOGRAPHY N/A 07/29/2018   Procedure: LEFT HEART CATH AND CORONARY ANGIOGRAPHY;  Surgeon: Lorretta Harp, MD;  Location:  [place] INVAS[facility] CV LAB;  Service: Cardiovascular;  Laterality: N/A;  . TEE WITHOUT CARDIOVERSION N/A 08/02/2018   Procedure: TRANSESOPHAGEAL ECHOCARDIOGRAM (TEE);  Surgeon: Grace Isaac, MD;  Location: [place];  Service: Open Heart Surgery;  Laterality: N/A;  . TUBAL LIGATION       Current Outpatient Medications  Medication Sig Dispense Refill  . acetaminophen (TYLENOL) 325 MG  tablet Take 2 tablets (650 mg total) by mouth every 6 (six) hours as needed for mild pain.    Marland Kitchen aspirin EC 81 MG EC tablet Take 1 tablet (81 mg total) by mouth daily. 30 tablet 11  . atorvastatin (LIPITOR) 80 MG tablet Take 1 tablet (80 mg total) by mouth daily at 6 PM. 90 tablet 3  . blood glucose meter kit and supplies KIT Dispense based on patient and insurance preference. Use up to four times daily as directed. (FOR ICD-9 250.00, 250.01). 1 each 0  . clopidogrel (PLAVIX) 75 MG tablet Take 1 tablet (75 mg total) by mouth daily. 90 tablet 3  . glimepiride (AMARYL) 4 MG tablet Take 1 tablet (4 mg total) by mouth daily before breakfast. MUST MAKE APPT FOR FURTHER REFILLS 90 tablet 0   No current facility-administered medications for this visit.     Allergies:   Patient has no known allergies.    Social History:  The patient  reports that she has never smoked. She has never used smokeless tobacco. She reports that she does not drink alcohol or use drugs.   Family History:  The patient's family history includes Hypertension in her father.    ROS: All other systems are reviewed and negative. Unless otherwise mentioned in H&P    PHYSICAL EXAM: VS:  BP 103/70   Pulse 69   Temp 97.7 F (36.5 C)   Ht 5' 3" (1.6 m)   Wt 213 lb 8 oz (96.8 kg)   BMI 37.82 kg/m  , BMI Body mass index is 37.82 kg/m. GEN: Well nourished, well developed, in no acute distress, obese HEENT: normal Neck: no JVD, carotid bruits, or masses Cardiac: RRR; no murmurs, rubs, or gallops,no edema  Respiratory:  Clear to auscultation bilaterally, normal work of breathing GI: soft, nontender, nondistended, + BS MS: no deformity or atrophy.  She does not have any tingling or pain with palpation of the ulnar nerve bilateral.  Rolling her shoulders causes pain in her shoulders and neck. Skin: warm and dry, no rash Neuro:  Strength and sensation are intact Psych: euthymic mood, full affect   EKG: Normal sinus rhythm,  heart rate of 69 bpm.  She has nonspecific T wave abnormalities in the inferior lateral leads.  Recent Labs: 08/03/2018: Magnesium 1.9 09/02/2018: BUN 17; Creatinine, Ser 1.20; Hemoglobin 11.2; Platelets 306; Potassium 4.6; Sodium 142    Lipid Panel    Component Value Date/Time   CHOL 91 07/30/2018 0335   CHOL 107 04/28/2018 0840   TRIG 134 07/30/2018 0335   HDL 26 (L) 07/30/2018 0335   HDL 33 (L) 04/28/2018 0840   CHOLHDL 3.5 07/30/2018 0335   VLDL 27 07/30/2018 0335   LDLCALC 38 07/30/2018 0335   LDLCALC 53 04/28/2018 0840      Wt Readings from Last 3 Encounters:  05/02/19 213 lb 8 oz (96.8 kg)  11/08/18 205 lb (93 kg)  09/02/18 197 lb (89.4 kg)      Other studies Reviewed:  Echocardiogram 07/30/2018  Left ventricle: The cavity size was normal. Wall thickness was   normal. Systolic  function was normal. The estimated ejection   fraction was in the range of 60% to 65%. Although no diagnostic   regional wall motion abnormality was identified, this possibility   cannot be completely excluded on the basis of this study. Doppler   parameters are consistent with abnormal left ventricular   relaxation (grade 1 diastolic dysfunction). - Aortic valve: There was no stenosis. - Mitral valve: There was no significant regurgitation. - Right ventricle: The cavity size was normal. Systolic function   was normal. - Pulmonary arteries: No complete TR doppler jet so unable to   estimate PA systolic pressure. - Inferior vena cava: The vessel was normal in size. The   respirophasic diameter changes were in the normal range (>= 50%),   consistent with normal central venous pressure.   ASSESSMENT AND PLAN:  1.  CAD: History of coronary artery disease with coronary artery bypass grafting.  The patient offers no complaints of chest pain she does have some shortness of breath which is likely related to deconditioning.  She is not walking very much sitting a lot working on crafts.  I have  asked her to get up and walk at least once an hour through her home or point and flex her feet and circle her ankles to allow for better blood flow in the lower extremities.  She may benefit from support hose if her legs begin to bother her from sitting so long.  I am not going to make any changes in her medication regimen at this time.  She is due to see PCP tomorrow at which time she will have usual labs drawn.  2.  Musculoskeletal pain: Having neck shoulder and wrist pain.  She may have some arthritis versus carpal tunnel versus cervical spine abnormalities.  Will refer to PCP for further evaluation of her neck and shoulders and wrists.  Vitamin D level should be drawn if not already done and further x-rays if clinically warranted.  3.  Hypercholesterolemia: Continue atorvastatin 80 mg daily.  Fasting lipids and LFTs should be drawn tomorrow by PCP.  Will review results when available.   Current medicines are reviewed at length with the patient today.    Labs/ tests ordered today include: Completed by PCP on 05/03/2019 (pending)  Phill Myron. West Pugh, ANP, [facility]   05/02/2019 10:58 AM    [place] [address] Office [phone] Fax [phone]

## 2019-05-02 ENCOUNTER — Other Ambulatory Visit: Payer: Self-pay

## 2019-05-02 ENCOUNTER — Ambulatory Visit (INDEPENDENT_AMBULATORY_CARE_PROVIDER_SITE_OTHER): Payer: Self-pay | Admitting: Adult Health

## 2019-05-02 ENCOUNTER — Encounter: Payer: Self-pay | Admitting: Adult Health

## 2019-05-02 VITALS — BP 103/70 | HR 69 | Temp 97.7°F | Ht 63.0 in | Wt 213.5 lb

## 2019-05-02 DIAGNOSIS — M7918 Myalgia, other site: Secondary | ICD-10-CM

## 2019-05-02 DIAGNOSIS — E7800 Pure hypercholesterolemia, unspecified: Secondary | ICD-10-CM

## 2019-05-02 DIAGNOSIS — I2510 Atherosclerotic heart disease of native coronary artery without angina pectoris: Secondary | ICD-10-CM

## 2019-05-02 DIAGNOSIS — I214 Non-ST elevation (NSTEMI) myocardial infarction: Secondary | ICD-10-CM

## 2019-05-02 DIAGNOSIS — E119 Type 2 diabetes mellitus without complications: Secondary | ICD-10-CM

## 2019-05-02 MED ORDER — CLOPIDOGREL BISULFATE 75 MG PO TABS: 75.0000 mg | ORAL_TABLET | Freq: Every day | ORAL | 3 refills | Status: DC

## 2019-05-02 MED ORDER — ATORVASTATIN CALCIUM 80 MG PO TABS: 80.0000 mg | ORAL_TABLET | Freq: Every day | ORAL | 3 refills | Status: DC

## 2019-05-02 NOTE — Patient Instructions (Signed)
Medication Instructions:  Continue current medications  If you need a refill on your cardiac medications before your next appointment, please call your pharmacy.  Labwork: None Ordered   Testing/Procedures: None Ordered  Follow-Up: You will need a follow up appointment in 6 months.  Please call our office 2 months in advance to schedule this appointment.  You may see Jonathan Berry, MD or one of the following Advanced Practice Providers on your designated Care Team:   Luke Kilroy, PA-C Krista Kroeger, PA-C . Callie Goodrich, PA-C     At [facility], you and your health needs are our priority.  As part of our continuing mission to provide you with exceptional heart care, we have created designated Provider Care Teams.  These Care Teams include your primary Cardiologist (physician) and Advanced Practice Providers (APPs -  Physician Assistants and Nurse Practitioners) who all work together to provide you with the care you need, when you need it.  Thank you for choosing [facility] at [facility]!!     

## 2019-05-03 ENCOUNTER — Ambulatory Visit (INDEPENDENT_AMBULATORY_CARE_PROVIDER_SITE_OTHER): Payer: Self-pay | Admitting: Primary Care

## 2019-05-03 ENCOUNTER — Encounter (INDEPENDENT_AMBULATORY_CARE_PROVIDER_SITE_OTHER): Payer: Self-pay | Admitting: Primary Care

## 2019-05-03 VITALS — BP 116/58 | HR 73 | Temp 97.5°F | Ht 63.0 in | Wt 221.6 lb

## 2019-05-03 DIAGNOSIS — Z1159 Encounter for screening for other viral diseases: Secondary | ICD-10-CM

## 2019-05-03 DIAGNOSIS — E119 Type 2 diabetes mellitus without complications: Secondary | ICD-10-CM

## 2019-05-03 DIAGNOSIS — Z1211 Encounter for screening for malignant neoplasm of colon: Secondary | ICD-10-CM

## 2019-05-03 LAB — POCT GLYCOSYLATED HEMOGLOBIN (HGB A1C): Hemoglobin A1C: 9.7 % — AB (ref 4.0–5.6)

## 2019-05-03 LAB — POCT CBG (FASTING - GLUCOSE)-MANUAL ENTRY: Glucose Fasting, POC: 169 mg/dL — AB (ref 70–99)

## 2019-05-03 MED ORDER — METFORMIN HCL 1000 MG PO TABS: 1000.0000 mg | ORAL_TABLET | Freq: Two times a day (BID) | ORAL | 3 refills | Status: DC

## 2019-05-03 MED ORDER — GLIMEPIRIDE 4 MG PO TABS: 4.0000 mg | ORAL_TABLET | Freq: Every day | ORAL | 0 refills | Status: DC

## 2019-05-03 NOTE — Progress Notes (Signed)
Established Patient Office Visit  Subjective:  Patient ID: Erika Bates, female    DOB: [birth year]  Age: 68 y.o. MRN: [id number]  CC:  Chief Complaint  Patient presents with  . New Patient (Initial Visit)    HTN/DM/ fasting labs     HPI Erika Bates presents for management of diabetes , blood pressure is well controlled- she denies shortness of breath, headaches, chest pain or lower extremity edema. A1c today is 9.7 - 9 month ago she was 7.7.   Past Medical History:  Diagnosis Date  . Dialysis patient [facility]) [phone]  . Hypertension   . Myocardial infarction ([place]) 01/2018  . Pre-diabetes     Past Surgical History:  Procedure Laterality Date  . ABDOMINAL EXPLORATION SURGERY  >1990   "they thought it was problems w/my gallbladder; said it was my pancreas but they didn't take that out" (02/16/2018)  . CARDIAC CATHETERIZATION  07/29/2018  . CORONARY ARTERY BYPASS GRAFT N/A 08/02/2018   Procedure: CORONARY ARTERY BYPASS GRAFTING (CABG) times 2 using left internal mammary artery to LAD  and left greater saphenous vein to Ramus harvested endoscopically.;  Surgeon: Grace Isaac, MD;  Location: [place];  Service: Open Heart Surgery;  Laterality: N/A;  . CORONARY STENT INTERVENTION N/A 02/16/2018   Procedure: CORONARY STENT INTERVENTION;  Surgeon: Leonie Man, MD;  Location: [place] CV [facility];  Service: Cardiovascular;  Laterality: N/A;  . LEFT HEART CATH AND CORONARY ANGIOGRAPHY N/A 02/16/2018   Procedure: LEFT HEART CATH AND CORONARY ANGIOGRAPHY;  Surgeon: Leonie Man, MD;  Location: [place] [facility];  Service: Cardiovascular;  Laterality: N/A;  . LEFT HEART CATH AND CORONARY ANGIOGRAPHY N/A 07/29/2018   Procedure: LEFT HEART CATH AND CORONARY ANGIOGRAPHY;  Surgeon: Lorretta Harp, MD;  Location: [place] [facility];  Service: Cardiovascular;  Laterality: N/A;  . TEE WITHOUT CARDIOVERSION N/A 08/02/2018   Procedure: TRANSESOPHAGEAL ECHOCARDIOGRAM (TEE);  Surgeon:  Grace Isaac, MD;  Location: [place];  Service: Open Heart Surgery;  Laterality: N/A;  . TUBAL LIGATION      Family History  Problem Relation Age of Onset  . Hypertension Father     Social History   Socioeconomic History  . Marital status: Significant Other    Spouse name: Not on file  . Number of children: Not on file  . Years of education: Not on file  . Highest education level: Not on file  Occupational History  . Not on file  Social Needs  . Financial resource strain: Not on file  . Food insecurity    Worry: Not on file    Inability: Not on file  . Transportation needs    Medical: Not on file    Non-medical: Not on file  Tobacco Use  . Smoking status: Never Smoker  . Smokeless tobacco: Never Used  Substance and Sexual Activity  . Alcohol use: No  . Drug use: No  . Sexual activity: Not Currently  Lifestyle  . Physical activity    Days per week: Not on file    Minutes per session: Not on file  . Stress: Not on file  Relationships  . Social Herbalist on phone: Not on file    Gets together: Not on file    Attends religious service: Not on file    Active member of club or organization: Not on file    Attends meetings of clubs or organizations: Not on file    Relationship status: Not  on file  . Intimate partner violence    Fear of current or ex partner: Not on file    Emotionally abused: Not on file    Physically abused: Not on file    Forced sexual activity: Not on file  Other Topics Concern  . Not on file  Social History Narrative  . Not on file    Outpatient Medications Prior to Visit  Medication Sig Dispense Refill  . aspirin EC 81 MG EC tablet Take 1 tablet (81 mg total) by mouth daily. 30 tablet 11  . atorvastatin (LIPITOR) 80 MG tablet Take 1 tablet (80 mg total) by mouth daily at 6 PM. 90 tablet 3  . clopidogrel (PLAVIX) 75 MG tablet Take 1 tablet (75 mg total) by mouth daily. 90 tablet 3  . glimepiride (AMARYL) 4 MG tablet Take 1  tablet (4 mg total) by mouth daily before breakfast. MUST MAKE APPT FOR FURTHER REFILLS 90 tablet 0  . acetaminophen (TYLENOL) 325 MG tablet Take 2 tablets (650 mg total) by mouth every 6 (six) hours as needed for mild pain. (Patient not taking: Reported on 05/03/2019)    . blood glucose meter kit and supplies KIT Dispense based on patient and insurance preference. Use up to four times daily as directed. (FOR ICD-9 250.00, 250.01). 1 each 0   No facility-administered medications prior to visit.     No Known Allergies  ROS Review of Systems  Respiratory: Positive for shortness of breath.        Exertion   Gastrointestinal: Positive for constipation.  Endocrine: Positive for polydipsia, polyphagia and polyuria.  Genitourinary: Positive for frequency.  Neurological: Positive for headaches.  Sensory exam of the foot is normal, tested with the monofilament. Good pulses, no lesions or ulcers, good peripheral pulses.    Objective:    Physical Exam  Constitutional: She is oriented to person, place, and time. She appears well-developed and well-nourished.  HENT:  Head: Normocephalic and atraumatic.  Eyes: Pupils are equal, round, and reactive to light. EOM are normal.  Neck: Normal range of motion. Neck supple.  Cardiovascular: Normal rate and regular rhythm.  Pulmonary/Chest: Effort normal and breath sounds normal.  Abdominal: Soft. Bowel sounds are normal. She exhibits distension.  Musculoskeletal: Normal range of motion.  Neurological: She is alert and oriented to person, place, and time.  Skin: Skin is warm and dry.  Psychiatric: She has a normal mood and affect.    BP (!) 116/58 (BP Location: Right Arm, Patient Position: Sitting, Cuff Size: Large)   Pulse 73   Temp (!) 97.5 F (36.4 C) (Tympanic)   Ht '5\' 3"'$  (1.6 m)   Wt 221 lb 9.6 oz (100.5 kg)   SpO2 96%   BMI 39.25 kg/m  Wt Readings from Last 3 Encounters:  05/03/19 221 lb 9.6 oz (100.5 kg)  05/02/19 213 lb 8 oz (96.8 kg)   11/08/18 205 lb (93 kg)     Health Maintenance Due  Topic Date Due  . Hepatitis C Screening  July 06, 1951  . OPHTHALMOLOGY EXAM  05/03/1963  . MAMMOGRAM  05/03/2003  . Fecal DNA (Cologuard)  05/03/2003  . DEXA SCAN  05/02/2018  . PNA vac Low Risk Adult (1 of 2 - PCV13) 05/02/2018  . HEMOGLOBIN A1C  01/31/2019  . FOOT EXAM  03/18/2019  . URINE MICROALBUMIN  03/18/2019  . INFLUENZA VACCINE  04/30/2019    There are no preventive care reminders to display for this patient.  No results found for:  TSH Lab Results  Component Value Date   WBC 7.4 09/02/2018   HGB 11.2 09/02/2018   HCT 36.1 09/02/2018   MCV 84 09/02/2018   PLT 306 09/02/2018   Lab Results  Component Value Date   NA 142 09/02/2018   K 4.6 09/02/2018   CO2 22 09/02/2018   GLUCOSE 57 (L) 09/02/2018   BUN 17 09/02/2018   CREATININE 1.20 (H) 09/02/2018   BILITOT 0.4 04/28/2018   ALKPHOS 99 04/28/2018   AST 16 04/28/2018   ALT 14 04/28/2018   PROT 7.7 04/28/2018   ALBUMIN 4.0 04/28/2018   CALCIUM 9.4 09/02/2018   ANIONGAP 8 08/07/2018   Lab Results  Component Value Date   CHOL 91 07/30/2018   Lab Results  Component Value Date   HDL 26 (L) 07/30/2018   Lab Results  Component Value Date   LDLCALC 38 07/30/2018   Lab Results  Component Value Date   TRIG 134 07/30/2018   Lab Results  Component Value Date   CHOLHDL 3.5 07/30/2018   Lab Results  Component Value Date   HGBA1C 9.7 (A) 05/03/2019      Assessment & Plan:   Problem List Items Addressed This Visit    None  Aanvi was seen today for new patient (initial visit).  Diagnoses and all orders for this visit:  Type 2 diabetes mellitus without complication, without long-term current use of insulin ([place]) ADA recommends the following therapeutic goals for glycemic control related to A1c measurements: Goal of therapy: Less than 6.5 hemoglobin A1c.  Reference clinical practice recommendations. Foods that are high in carbohydrates are the  following rice, potatoes, breads, sugars, and pastas.  Reduction in the intake (eating) will assist in lowering your blood sugars.   HgB A1c -     Glucose (CBG), Fasting -     Microalbumin/Creatinine Ratio, Urine -     Lipid Panel -     CBC with Differentia -     CMP14+EGFR -     Ambulatory referral to Ophthalmology -     glimepiride (AMARYL) 4 MG tablet; Take 1 tablet (4 mg total) by mouth daily before breakfast. MUST MAKE APPT FOR FURTHER REFILLS  Special screening for malignant neoplasms, colon Normal colon cancer screening.  CDC recommends colorectal screening from ages 19-75. Screening can begin at 31 or earlier in some cases. This screening is used for a disease when no symptoms are present . Diagnostic test is used for symptoms examples blood in stool, colorectal polyps or coloector cancer, family history or inflammatory bowel disease - chron's or ulcerative colitis .(USPSTF) -     Fecal occult blood, imunochemical([facility]); Future . Comprehensive diabetic foot examination, type 2 DM, encounter for [facility]) Sensory exam of the foot is normal, tested with the monofilament. Good pulses, no lesions or ulcers, good peripheral pulses.  Need for hepatitis C screening test -     Hepatitis C Antibody  Class 3 severe obesity due to excess calories with serious comorbidity in adult, unspecified BMI ([place]) Obesity is 30-39 indicating an excess in caloric intake or underlining conditions. This may lead to other co-morbidities. Lifestyle modifications of diet and exercise may reduce obesity.   Other orders -     metFORMIN (GLUCOPHAGE) 1000 MG tablet; Take 1 tablet (1,000 mg total) by mouth 2 (two) times daily with a meal.       Follow-up: Return in about 3 months (around 08/03/2019) for Diabetes.    Kerin Perna, NP

## 2019-05-03 NOTE — Patient Instructions (Signed)

## 2019-05-04 LAB — CMP14+EGFR
ALT: 10 IU/L (ref 0–32)
AST: 12 IU/L (ref 0–40)
Albumin/Globulin Ratio: 1 — ABNORMAL LOW (ref 1.2–2.2)
Albumin: 3.9 g/dL (ref 3.8–4.8)
Alkaline Phosphatase: 104 IU/L (ref 39–117)
BUN/Creatinine Ratio: 11 — ABNORMAL LOW (ref 12–28)
BUN: 16 mg/dL (ref 8–27)
Bilirubin Total: 0.5 mg/dL (ref 0.0–1.2)
CO2: 23 mmol/L (ref 20–29)
Calcium: 9.4 mg/dL (ref 8.7–10.3)
Chloride: 102 mmol/L (ref 96–106)
Creatinine, Ser: 1.41 mg/dL — ABNORMAL HIGH (ref 0.57–1.00)
GFR calc Af Amer: 45 mL/min/{1.73_m2} — ABNORMAL LOW (ref >59)
GFR calc non Af Amer: 39 mL/min/{1.73_m2} — ABNORMAL LOW (ref >59)
Globulin, Total: 3.9 g/dL (ref 1.5–4.5)
Glucose: 163 mg/dL — ABNORMAL HIGH (ref 65–99)
Potassium: 4.6 mmol/L (ref 3.5–5.2)
Sodium: 140 mmol/L (ref 134–144)
Total Protein: 7.8 g/dL (ref 6.0–8.5)

## 2019-05-04 LAB — CBC WITH DIFFERENTIAL/PLATELET
Basophils Absolute: 0 10*3/uL (ref 0.0–0.2)
Basos: 0 %
EOS (ABSOLUTE): 0.3 10*3/uL (ref 0.0–0.4)
Eos: 3 %
Hematocrit: 40.3 % (ref 34.0–46.6)
Hemoglobin: 12.6 g/dL (ref 11.1–15.9)
Immature Grans (Abs): 0 10*3/uL (ref 0.0–0.1)
Immature Granulocytes: 0 %
Lymphocytes Absolute: 2.9 10*3/uL (ref 0.7–3.1)
Lymphs: 30 %
MCH: 26.3 pg — ABNORMAL LOW (ref 26.6–33.0)
MCHC: 31.3 g/dL — ABNORMAL LOW (ref 31.5–35.7)
MCV: 84 fL (ref 79–97)
Monocytes Absolute: 0.6 10*3/uL (ref 0.1–0.9)
Monocytes: 6 %
Neutrophils Absolute: 5.8 10*3/uL (ref 1.4–7.0)
Neutrophils: 61 %
Platelets: 258 10*3/uL (ref 150–450)
RBC: 4.8 x10E6/uL (ref 3.77–5.28)
RDW: 13.3 % (ref 11.7–15.4)
WBC: 9.6 10*3/uL (ref 3.4–10.8)

## 2019-05-04 LAB — MICROALBUMIN / CREATININE URINE RATIO
Creatinine, Urine: 94.6 mg/dL
Microalb/Creat Ratio: 107 mg/g creat — ABNORMAL HIGH (ref 0–29)
Microalbumin, Urine: 101.4 ug/mL

## 2019-05-04 LAB — LIPID PANEL
Chol/HDL Ratio: 3.8 ratio (ref 0.0–4.4)
Cholesterol, Total: 149 mg/dL (ref 100–199)
HDL: 39 mg/dL — ABNORMAL LOW (ref >39)
LDL Calculated: 87 mg/dL (ref 0–99)
Triglycerides: 116 mg/dL (ref 0–149)
VLDL Cholesterol Cal: 23 mg/dL (ref 5–40)

## 2019-05-04 LAB — HEPATITIS C ANTIBODY: Hep C Virus Ab: <0.1 s/co ratio (ref 0.0–0.9)

## 2019-05-05 ENCOUNTER — Telehealth (INDEPENDENT_AMBULATORY_CARE_PROVIDER_SITE_OTHER): Payer: Self-pay

## 2019-05-05 NOTE — Telephone Encounter (Signed)
-----   Message from Kerin Perna, NP sent at 05/04/2019  1:36 PM EDT ----- Patient kidney function is declining will monitor closely reccomendation is for a nephrologist if GFR 30 <. Other labs are acceptable. HDL indicates the the need to exercise more. No change in medication.

## 2019-05-05 NOTE — Telephone Encounter (Signed)
Call placed to patient. Due to language barrier patients daughter was given the results per DPR. She is aware that her moms kidney function is declining and we will continue to monitor closely. HDL indicates patient needs more exercise. No change in cholesterol medication. All other labs normal. And hepatitis is negative. Daughter will inform patient of all results. Nat Christen, CMA

## 2019-05-18 ENCOUNTER — Other Ambulatory Visit (INDEPENDENT_AMBULATORY_CARE_PROVIDER_SITE_OTHER): Payer: Self-pay

## 2019-05-18 DIAGNOSIS — Z1211 Encounter for screening for malignant neoplasm of colon: Secondary | ICD-10-CM

## 2019-05-20 LAB — FECAL OCCULT BLOOD, IMMUNOCHEMICAL: Fecal Occult Bld: NEGATIVE

## 2019-06-01 ENCOUNTER — Telehealth (INDEPENDENT_AMBULATORY_CARE_PROVIDER_SITE_OTHER): Payer: Self-pay

## 2019-06-01 NOTE — Telephone Encounter (Signed)
-----   Message from Kerin Perna, NP sent at 05/22/2019  9:27 AM EDT ----- Normal colon cancer screening.  CDC recommends colorectal screening from ages 11-75. Screening can begin at 58 or earlier in some cases. This screening is used for a disease when no symptoms are present . Diagnostic test is used for symptoms examples blood in stool, colorectal polyps or coloector cancer, family history or inflammatory bowel disease - chron's or ulcerative colitis .(USPSTF)

## 2019-06-01 NOTE — Telephone Encounter (Signed)
Daughter verified patients date of birth. She was informed that the colon cancer screening was normal. Nat Christen, CMA

## 2019-08-03 ENCOUNTER — Ambulatory Visit (INDEPENDENT_AMBULATORY_CARE_PROVIDER_SITE_OTHER): Payer: Self-pay | Admitting: Primary Care

## 2019-08-11 ENCOUNTER — Encounter (INDEPENDENT_AMBULATORY_CARE_PROVIDER_SITE_OTHER): Payer: Self-pay | Admitting: Primary Care

## 2019-08-11 ENCOUNTER — Ambulatory Visit (INDEPENDENT_AMBULATORY_CARE_PROVIDER_SITE_OTHER): Payer: Self-pay | Admitting: Primary Care

## 2019-08-11 ENCOUNTER — Other Ambulatory Visit: Payer: Self-pay

## 2019-08-11 VITALS — BP 114/71 | HR 61 | Temp 97.1°F | Wt 216.4 lb

## 2019-08-11 DIAGNOSIS — Z6838 Body mass index (BMI) 38.0-38.9, adult: Secondary | ICD-10-CM

## 2019-08-11 DIAGNOSIS — Z1231 Encounter for screening mammogram for malignant neoplasm of breast: Secondary | ICD-10-CM

## 2019-08-11 DIAGNOSIS — Z760 Encounter for issue of repeat prescription: Secondary | ICD-10-CM

## 2019-08-11 DIAGNOSIS — E119 Type 2 diabetes mellitus without complications: Secondary | ICD-10-CM

## 2019-08-11 LAB — POCT GLYCOSYLATED HEMOGLOBIN (HGB A1C): Hemoglobin A1C: 7.2 % — AB (ref 4.0–5.6)

## 2019-08-11 LAB — POCT CBG (FASTING - GLUCOSE)-MANUAL ENTRY: Glucose Fasting, POC: 139 mg/dL — AB (ref 70–99)

## 2019-08-11 MED ORDER — GLIMEPIRIDE 4 MG PO TABS: 4.0000 mg | ORAL_TABLET | Freq: Every day | ORAL | 1 refills | Status: DC

## 2019-08-11 MED ORDER — LISINOPRIL 2.5 MG PO TABS: ORAL_TABLET | ORAL | 1 refills | Status: DC

## 2019-08-11 MED ORDER — METFORMIN HCL 1000 MG PO TABS: 1000.0000 mg | ORAL_TABLET | Freq: Two times a day (BID) | ORAL | 1 refills | Status: DC

## 2019-08-11 NOTE — Patient Instructions (Signed)
Mamografa Mammogram [employer] es un radiografa de las mamas que se realiza para determinar si hay cambios que no son normales. Este estudio permite explorar y Pension scheme manager cualquier cambio que pudiera sugerir la presencia de cncer de mama. Las [employer]ck & Co se Research scientist (medical) en las mujeres. Un hombre puede hacerse una mamografa si tiene un bulto o hinchazn en la mama. Tambin puede ayudar a identificar otros cambios y variaciones en las mamas. Informe al mdico:  Acerca de cualquier alergia que tenga.  Si tiene implantes mamarios.  Si ha tenido enfermedades, biopsias o cirugas previas de Scientist, research (medical).  Si est amamantando.  Si es menor de 25aos.  Si tiene antecedentes familiares de cncer de mama.  Si est embarazada o podra estarlo. Cules son los riesgos? En general, se trata de un procedimiento seguro. Sin embargo, pueden ocurrir complicaciones, por ejemplo:  Exposicin a la radiacin. En [employer], los [place] de radiacin son muy bajos.  La interpretacin [employer].  La necesidad de Optometrist ms [employer].  La imposibilidad de la mamografa de Hydrographic surveyor algunos tipos de cncer. Qu ocurre antes del procedimiento?  Hgase este estudio aproximadamente 1 o 2semanas despus de la [place]. Generalmente, este es el momento en que las mamas estn menos sensibles.  Si consulta a un mdico nuevo o cambia de Chief of Staff, enve las Merck & Co anteriores al [employer].  El da del estudio, lvese las [place] y [place].  No use desodorantes, perfumes, lociones o talcos el da del estudio.  Qutese las alhajas del cuello.  Use prendas que pueda ponerse y sacarse fcilmente. Qu ocurre durante el procedimiento?   Se quitar la ropa de la cintura para [place]. Se colocar una bata.  Debe permanecer de pie delante de la mquina de rayos-X.  Se colocar cada mama entre dos placas de [place] unos segundos. Intente estar lo ms relajada posible. Esto no causa ningn dao a las mamas. Si siente alguna molestia, ser pasajera.  Se tomarn radiografas desde diferentes ngulos de cada mama. Este procedimiento puede variar segn el mdico y el hospital. Sander Nephew ocurre despus del procedimiento?  Ron Parker ser leda por un especialista (radilogo).  Tal vez deba repetir [employer] del estudio. Esto depende de la calidad de las imgenes.  Pregunte la fecha en que los resultados estarn disponibles. Asegrese de [employer].  Puede volver a sus actividades habituales. Resumen  Lavinia Sharps es un radiografa de baja energa de las mamas que se realiza para determinar si hay cambios anormales. Un hombre puede [employer] examen si tiene un bulto o hinchazn en la mama.  Antes del procedimiento, informe al mdico sobre cualquier problema en las mamas que haya tenido en el pasado.  Hgase este estudio aproximadamente 1 o 2semanas despus de la [place].  Para el examen, se colocar cada mama entre dos placas de [place] unos segundos.  Ron Parker ser leda por un especialista (radilogo). Pregunte la fecha en que los resultados estarn disponibles. Asegrese de [employer]. Esta informacin no tiene Marine scientist el consejo del mdico. Asegrese de hacerle al mdico cualquier pregunta que tenga. Document Released: 10/18/2010 Document Revised: 06/15/2018 Document Reviewed: 06/15/2018 Elsevier Patient Education  2020 [employer].

## 2019-08-11 NOTE — Progress Notes (Signed)
Established Patient Office Visit  Subjective:  Patient ID: Erika Bates, female    DOB: [birth year]  Age: 68 y.o. MRN: [id number]  CC:  Chief Complaint  Patient presents with  . Diabetes    HPI Erika Bates presents for the management of type 2 diabetes. A1C today is 7.2 a vast improvement in 3 months previously 9.7. Patients daughter has been making her walk and watching what Erika Bates is eating. Erika Bates sees this is paying off and taking her medication as prescribed.  Past Medical History:  Diagnosis Date  . Dialysis patient [facility]) [phone]  . Hypertension   . Myocardial infarction ([place]) 01/2018  . Pre-diabetes     Past Surgical History:  Procedure Laterality Date  . ABDOMINAL EXPLORATION SURGERY  >1990   "they thought it was problems w/my gallbladder; said it was my pancreas but they didn't take that out" (02/16/2018)  . CARDIAC CATHETERIZATION  07/29/2018  . CORONARY ARTERY BYPASS GRAFT N/A 08/02/2018   Procedure: CORONARY ARTERY BYPASS GRAFTING (CABG) times 2 using left internal mammary artery to LAD  and left greater saphenous vein to Ramus harvested endoscopically.;  Surgeon: Grace Isaac, MD;  Location: [place];  Service: Open Heart Surgery;  Laterality: N/A;  . CORONARY STENT INTERVENTION N/A 02/16/2018   Procedure: CORONARY STENT INTERVENTION;  Surgeon: Leonie Man, MD;  Location: [place] [facility];  Service: Cardiovascular;  Laterality: N/A;  . LEFT HEART CATH AND CORONARY ANGIOGRAPHY N/A 02/16/2018   Procedure: LEFT HEART CATH AND CORONARY ANGIOGRAPHY;  Surgeon: Leonie Man, MD;  Location: [place] [facility];  Service: Cardiovascular;  Laterality: N/A;  . LEFT HEART CATH AND CORONARY ANGIOGRAPHY N/A 07/29/2018   Procedure: LEFT HEART CATH AND CORONARY ANGIOGRAPHY;  Surgeon: Lorretta Harp, MD;  Location: [place] [facility];  Service: Cardiovascular;  Laterality: N/A;  . TEE WITHOUT CARDIOVERSION N/A 08/02/2018   Procedure: TRANSESOPHAGEAL ECHOCARDIOGRAM  (TEE);  Surgeon: Grace Isaac, MD;  Location: [place];  Service: Open Heart Surgery;  Laterality: N/A;  . TUBAL LIGATION      Family History  Problem Relation Age of Onset  . Hypertension Father     Social History   Socioeconomic History  . Marital status: Significant Other    Spouse name: Not on file  . Number of children: Not on file  . Years of education: Not on file  . Highest education level: Not on file  Occupational History  . Not on file  Social Needs  . Financial resource strain: Not on file  . Food insecurity    Worry: Not on file    Inability: Not on file  . Transportation needs    Medical: Not on file    Non-medical: Not on file  Tobacco Use  . Smoking status: Never Smoker  . Smokeless tobacco: Never Used  Substance and Sexual Activity  . Alcohol use: No  . Drug use: No  . Sexual activity: Not Currently  Lifestyle  . Physical activity    Days per week: Not on file    Minutes per session: Not on file  . Stress: Not on file  Relationships  . Social Herbalist on phone: Not on file    Gets together: Not on file    Attends religious service: Not on file    Active member of club or organization: Not on file    Attends meetings of clubs or organizations: Not on file    Relationship status:  Not on file  . Intimate partner violence    Fear of current or ex partner: Not on file    Emotionally abused: Not on file    Physically abused: Not on file    Forced sexual activity: Not on file  Other Topics Concern  . Not on file  Social History Narrative  . Not on file    Outpatient Medications Prior to Visit  Medication Sig Dispense Refill  . acetaminophen (TYLENOL) 325 MG tablet Take 2 tablets (650 mg total) by mouth every 6 (six) hours as needed for mild pain.    Marland Kitchen aspirin EC 81 MG EC tablet Take 1 tablet (81 mg total) by mouth daily. 30 tablet 11  . atorvastatin (LIPITOR) 80 MG tablet Take 1 tablet (80 mg total) by mouth daily at 6 PM. 90  tablet 3  . blood glucose meter kit and supplies KIT Dispense based on patient and insurance preference. Use up to four times daily as directed. (FOR ICD-9 250.00, 250.01). 1 each 0  . clopidogrel (PLAVIX) 75 MG tablet Take 1 tablet (75 mg total) by mouth daily. 90 tablet 3  . glimepiride (AMARYL) 4 MG tablet Take 1 tablet (4 mg total) by mouth daily before breakfast. MUST MAKE APPT FOR FURTHER REFILLS 90 tablet 0  . metFORMIN (GLUCOPHAGE) 1000 MG tablet Take 1 tablet (1,000 mg total) by mouth 2 (two) times daily with a meal. 180 tablet 3   No facility-administered medications prior to visit.     No Known Allergies  ROS Review of Systems  All other systems reviewed and are negative.     Objective:    Physical Exam  Constitutional: Erika Bates appears well-developed and well-nourished.  Obese loosing weight  Eyes: Pupils are equal, round, and reactive to light. EOM are normal.  Neck: Normal range of motion. Neck supple.  Cardiovascular: Normal rate.  Pulmonary/Chest: Effort normal.  Abdominal: Soft. Bowel sounds are normal. Erika Bates exhibits distension.  Musculoskeletal: Normal range of motion.  Skin: Skin is warm.  Psychiatric: Erika Bates has a normal mood and affect. Her behavior is normal.    BP 114/71 (BP Location: Right Arm, Patient Position: Sitting, Cuff Size: Normal)   Pulse 61   Temp (!) 97.1 F (36.2 C) (Temporal)   Wt 216 lb 6.4 oz (98.2 kg)   SpO2 95%   BMI 38.33 kg/m  Wt Readings from Last 3 Encounters:  08/11/19 216 lb 6.4 oz (98.2 kg)  05/03/19 221 lb 9.6 oz (100.5 kg)  05/02/19 213 lb 8 oz (96.8 kg)     Health Maintenance Due  Topic Date Due  . OPHTHALMOLOGY EXAM  05/03/1963  . MAMMOGRAM  05/03/2003  . Fecal DNA (Cologuard)  05/03/2003  . DEXA SCAN  05/02/2018  . PNA vac Low Risk Adult (1 of 2 - PCV13) 05/02/2018  . INFLUENZA VACCINE  04/30/2019    There are no preventive care reminders to display for this patient.  No results found for: TSH Lab Results   Component Value Date   WBC 9.6 05/03/2019   HGB 12.6 05/03/2019   HCT 40.3 05/03/2019   MCV 84 05/03/2019   PLT 258 05/03/2019   Lab Results  Component Value Date   NA 140 05/03/2019   K 4.6 05/03/2019   CO2 23 05/03/2019   GLUCOSE 163 (H) 05/03/2019   BUN 16 05/03/2019   CREATININE 1.41 (H) 05/03/2019   BILITOT 0.5 05/03/2019   ALKPHOS 104 05/03/2019   AST 12 05/03/2019   ALT  10 05/03/2019   PROT 7.8 05/03/2019   ALBUMIN 3.9 05/03/2019   CALCIUM 9.4 05/03/2019   ANIONGAP 8 08/07/2018   Lab Results  Component Value Date   CHOL 149 05/03/2019   Lab Results  Component Value Date   HDL 39 (L) 05/03/2019   Lab Results  Component Value Date   LDLCALC 87 05/03/2019   Lab Results  Component Value Date   TRIG 116 05/03/2019   Lab Results  Component Value Date   CHOLHDL 3.8 05/03/2019   Lab Results  Component Value Date   HGBA1C 7.2 (A) 08/11/2019      Assessment & Plan:  Bates was seen today for diabetes.  Diagnoses and all orders for this visit:  Type 2 diabetes mellitus without complication, without long-term current use of insulin (H[facility])  Not at therapeutic  goals for glycemic control related to A1c measurements: Goal of therapy: Less than 6.5 hemoglobin A1c. A1C today is 7.2 a vast improvement in 3 months previously 9.7. Discussed reducing foods that are high in carbohydrates are the following rice, potatoes, breads, sugars, and pastas.  Reduction in the intake (eating) will assist in lowering your blood sugars. -     Glucose (CBG), Fasting -     HgB A1c -     glimepiride (AMARYL) 4 MG tablet; Take 1 tablet (4 mg total) by mouth daily before breakfast. MUST MAKE APPT FOR FURTHER REFILLS -     Complete Metabolic Panel with GFR -     CBC with Differential  -     Lipid Panel  Class 3 severe obesity due to excess calories with serious comorbidity in adult, unspecified BMI (HCC) BMI is 38.3 Erika Bates is exercising, watching what Erika Bates eats. Is aware the more  weight Erika Bates looses may and can improve diabetes and reduce complications.  Encounter for screening mammogram for malignant neoplasm of breast Patient completed application for BCCP while in clinic and application has and faxed to [facility]. Patient aware that [facility] will contact her directly to schedule appointment.  Encounter for diabetic foot exam ([place]) Sensory exam of the foot is normal, tested with the monofilament. Good pulses, no lesions or ulcers, good peripheral pulses.   Other orders/Medication refill  -     metFORMIN (GLUCOPHAGE) 1000 MG tablet; Take 1 tablet (1,000 mg total) by mouth 2 (two) times daily with a meal. -     lisinopril (ZESTRIL) 2.5 MG tablet; Take 1 tablet daily for renal protection per ADA     Meds ordered this encounter  Medications  . metFORMIN (GLUCOPHAGE) 1000 MG tablet    Sig: Take 1 tablet (1,000 mg total) by mouth 2 (two) times daily with a meal.    Dispense:  180 tablet    Refill:  1  . glimepiride (AMARYL) 4 MG tablet    Sig: Take 1 tablet (4 mg total) by mouth daily before breakfast. MUST MAKE APPT FOR FURTHER REFILLS    Dispense:  90 tablet    Refill:  1  . lisinopril (ZESTRIL) 2.5 MG tablet    Sig: Take 1 tablet daily for renal protection per ADA    Dispense:  90 tablet    Refill:  1    Follow-up: Return in about 3 months (around 11/11/2019) for DM in person.    Kerin Perna, NP

## 2019-08-12 ENCOUNTER — Other Ambulatory Visit (INDEPENDENT_AMBULATORY_CARE_PROVIDER_SITE_OTHER): Payer: Self-pay | Admitting: Primary Care

## 2019-08-12 ENCOUNTER — Telehealth (INDEPENDENT_AMBULATORY_CARE_PROVIDER_SITE_OTHER): Payer: Self-pay

## 2019-08-12 DIAGNOSIS — N1832 Chronic kidney disease, stage 3b: Secondary | ICD-10-CM

## 2019-08-12 LAB — CBC WITH DIFFERENTIAL/PLATELET
Basophils Absolute: 0 10*3/uL (ref 0.0–0.2)
Basos: 0 %
EOS (ABSOLUTE): 0.3 10*3/uL (ref 0.0–0.4)
Eos: 3 %
Hematocrit: 38.6 % (ref 34.0–46.6)
Hemoglobin: 12.4 g/dL (ref 11.1–15.9)
Immature Grans (Abs): 0 10*3/uL (ref 0.0–0.1)
Immature Granulocytes: 0 %
Lymphocytes Absolute: 3.4 10*3/uL — ABNORMAL HIGH (ref 0.7–3.1)
Lymphs: 37 %
MCH: 26.8 pg (ref 26.6–33.0)
MCHC: 32.1 g/dL (ref 31.5–35.7)
MCV: 83 fL (ref 79–97)
Monocytes Absolute: 0.6 10*3/uL (ref 0.1–0.9)
Monocytes: 6 %
Neutrophils Absolute: 4.8 10*3/uL (ref 1.4–7.0)
Neutrophils: 54 %
Platelets: 256 10*3/uL (ref 150–450)
RBC: 4.63 x10E6/uL (ref 3.77–5.28)
RDW: 13.2 % (ref 11.7–15.4)
WBC: 9.1 10*3/uL (ref 3.4–10.8)

## 2019-08-12 LAB — CMP14+EGFR
ALT: 10 IU/L (ref 0–32)
AST: 14 IU/L (ref 0–40)
Albumin/Globulin Ratio: 1.1 — ABNORMAL LOW (ref 1.2–2.2)
Albumin: 3.9 g/dL (ref 3.8–4.8)
Alkaline Phosphatase: 93 IU/L (ref 39–117)
BUN/Creatinine Ratio: 11 — ABNORMAL LOW (ref 12–28)
BUN: 16 mg/dL (ref 8–27)
Bilirubin Total: 0.4 mg/dL (ref 0.0–1.2)
CO2: 23 mmol/L (ref 20–29)
Calcium: 9 mg/dL (ref 8.7–10.3)
Chloride: 102 mmol/L (ref 96–106)
Creatinine, Ser: 1.5 mg/dL — ABNORMAL HIGH (ref 0.57–1.00)
GFR calc Af Amer: 42 mL/min/{1.73_m2} — ABNORMAL LOW (ref >59)
GFR calc non Af Amer: 36 mL/min/{1.73_m2} — ABNORMAL LOW (ref >59)
Globulin, Total: 3.6 g/dL (ref 1.5–4.5)
Glucose: 127 mg/dL — ABNORMAL HIGH (ref 65–99)
Potassium: 4.3 mmol/L (ref 3.5–5.2)
Sodium: 139 mmol/L (ref 134–144)
Total Protein: 7.5 g/dL (ref 6.0–8.5)

## 2019-08-12 LAB — LIPID PANEL
Chol/HDL Ratio: 2.7 ratio (ref 0.0–4.4)
Cholesterol, Total: 100 mg/dL (ref 100–199)
HDL: 37 mg/dL — ABNORMAL LOW (ref >39)
LDL Chol Calc (NIH): 43 mg/dL (ref 0–99)
Triglycerides: 107 mg/dL (ref 0–149)
VLDL Cholesterol Cal: 20 mg/dL (ref 5–40)

## 2019-08-12 NOTE — Telephone Encounter (Signed)
Left voicemail notifying patient and daughter that labs are normal except decline in kidney function. Referred to nephrology. Increase water and decrease soda. Return call to [facility] at [phone] with any questions or concerns. Nat Christen, CMA

## 2019-08-12 NOTE — Telephone Encounter (Signed)
-----   Message from Kerin Perna, NP sent at 08/12/2019  2:56 PM EST ----- Labs are normal except kidney function a decline will refer to nephrology increase water and decrease soda intake

## 2019-11-11 ENCOUNTER — Ambulatory Visit (INDEPENDENT_AMBULATORY_CARE_PROVIDER_SITE_OTHER): Payer: Self-pay | Admitting: Primary Care

## 2020-01-12 ENCOUNTER — Other Ambulatory Visit: Payer: Self-pay | Admitting: Nephrology

## 2020-01-12 DIAGNOSIS — N1832 Chronic kidney disease, stage 3b: Secondary | ICD-10-CM

## 2020-01-31 ENCOUNTER — Ambulatory Visit: Payer: Self-pay | Admitting: Cardiovascular Disease

## 2020-02-14 ENCOUNTER — Encounter: Payer: Self-pay | Admitting: General Practice

## 2020-08-13 ENCOUNTER — Ambulatory Visit (INDEPENDENT_AMBULATORY_CARE_PROVIDER_SITE_OTHER): Payer: Self-pay | Admitting: *Deleted

## 2020-08-13 NOTE — Telephone Encounter (Signed)
Patient's daughter called and is not with patient at this time. Patient's daughter reports patient has complained of chest pain x 2 since last Thursday and on Saturday. Patient's daughter reports chest pain in middle of chest , unknown how long it lasts but reports pain goes away. Denies SOB, difficulty breathing, sweating , vomiting, dizziness, or pressure. Patient has hx Cabg x 2 per daughter. appt scheduled for 08/14/20. Patient's daughter reports patient is not having chest pain at this time. Care advise given. Patient's daughter verbalized understanding of care advise and to call back or take mother to [facility] or ED if symptoms worsen.   Reason for Disposition  [1] Chest pain(s) lasting a few seconds AND [2] persists > 3 days  Answer Assessment - Initial Assessment Questions 1. LOCATION: "Where does it hurt?"       Middle of chest  2. RADIATION: "Does the pain go anywhere else?" (e.g., into neck, jaw, arms, back)     Back of neck  3. ONSET: "When did the chest pain begin?" (Minutes, hours or days)      Last Thursday  Around 5pm 4. PATTERN "Does the pain come and go, or has it been constant since it started?"  "Does it get worse with exertion?"      Comes and goes 5. DURATION: "How long does it last" (e.g., seconds, minutes, hours)     na 6. SEVERITY: "How bad is the pain?"  (e.g., Scale 1-10; mild, moderate, or severe)    - MILD (1-3): doesn't interfere with normal activities     - MODERATE (4-7): interferes with normal activities or awakens from sleep    - SEVERE (8-10): excruciating pain, unable to do any normal activities       Mild chest pain  7. CARDIAC RISK FACTORS: "Do you have any history of heart problems or risk factors for heart disease?" (e.g., angina, prior heart attack; diabetes, high blood pressure, high cholesterol, smoker, or strong family history of heart disease)     Diabetes, CABG x 2 2019, taking medication for HTN 8. PULMONARY RISK FACTORS: "Do you have any history of  lung disease?"  (e.g., blood clots in lung, asthma, emphysema, birth control pills)     Na  9. CAUSE: "What do you think is causing the chest pain?"     na 10. OTHER SYMPTOMS: "Do you have any other symptoms?" (e.g., dizziness, nausea, vomiting, sweating, fever, difficulty breathing, cough)       No  11. PREGNANCY: "Is there any chance you are pregnant?" "When was your last menstrual period?"       na  Protocols used: CHEST PAIN-A-AH

## 2020-08-14 ENCOUNTER — Other Ambulatory Visit: Payer: Self-pay | Admitting: Primary Care

## 2020-08-14 ENCOUNTER — Ambulatory Visit (INDEPENDENT_AMBULATORY_CARE_PROVIDER_SITE_OTHER): Payer: Self-pay | Admitting: Primary Care

## 2020-08-14 ENCOUNTER — Other Ambulatory Visit: Payer: Self-pay

## 2020-08-14 ENCOUNTER — Encounter (INDEPENDENT_AMBULATORY_CARE_PROVIDER_SITE_OTHER): Payer: Self-pay | Admitting: Primary Care

## 2020-08-14 VITALS — BP 114/68 | HR 68 | Temp 97.2°F | Ht 63.0 in | Wt 215.8 lb

## 2020-08-14 DIAGNOSIS — Z1231 Encounter for screening mammogram for malignant neoplasm of breast: Secondary | ICD-10-CM

## 2020-08-14 DIAGNOSIS — E785 Hyperlipidemia, unspecified: Secondary | ICD-10-CM

## 2020-08-14 DIAGNOSIS — Z1211 Encounter for screening for malignant neoplasm of colon: Secondary | ICD-10-CM

## 2020-08-14 DIAGNOSIS — I10 Essential (primary) hypertension: Secondary | ICD-10-CM

## 2020-08-14 DIAGNOSIS — E119 Type 2 diabetes mellitus without complications: Secondary | ICD-10-CM

## 2020-08-14 LAB — GLUCOSE, POCT (MANUAL RESULT ENTRY): POC Glucose: 240 mg/dl — AB (ref 70–99)

## 2020-08-14 LAB — POCT GLYCOSYLATED HEMOGLOBIN (HGB A1C): Hemoglobin A1C: 10.8 % — AB (ref 4.0–5.6)

## 2020-08-14 MED ORDER — BLOOD GLUCOSE MONITOR KIT
PACK | 0 refills | Status: DC
Start: 2020-08-14 — End: 2021-01-08

## 2020-08-14 MED ORDER — LANTUS SOLOSTAR 100 UNIT/ML ~~LOC~~ SOPN: 12.0000 [IU] | PEN_INJECTOR | Freq: Every day | SUBCUTANEOUS | 99 refills | Status: DC

## 2020-08-14 MED ORDER — GLIMEPIRIDE 4 MG PO TABS: 4.0000 mg | ORAL_TABLET | Freq: Every day | ORAL | 1 refills | Status: DC

## 2020-08-14 MED ORDER — METFORMIN HCL 1000 MG PO TABS
1000.0000 mg | ORAL_TABLET | Freq: Two times a day (BID) | ORAL | 1 refills | Status: DC
Start: 2020-08-14 — End: 2020-08-14

## 2020-08-14 MED ORDER — LISINOPRIL 2.5 MG PO TABS
ORAL_TABLET | ORAL | 1 refills | Status: DC
Start: 2020-08-14 — End: 2020-08-14

## 2020-08-14 MED ORDER — METFORMIN HCL 1000 MG PO TABS: 1000.0000 mg | ORAL_TABLET | Freq: Two times a day (BID) | ORAL | 1 refills | Status: DC

## 2020-08-14 MED FILL — LANTUS SOLOSTAR 100 UNITS/M: 100 | 25 days supply | Qty: 3 | Fill #0

## 2020-08-14 MED FILL — LISINOPRIL 2.5 MG TABLET: 2.5 | 30 days supply | Qty: 30 | Fill #0

## 2020-08-14 MED FILL — TRUE METRIX TEST STRIP: 25 days supply | Qty: 100 | Fill #0

## 2020-08-14 MED FILL — !TRUE METRIX BLOOD GLUCOSE: 30 days supply | Qty: 1 | Fill #0

## 2020-08-14 MED FILL — TRUEplus LANCETS 28G MISC: 25 days supply | Qty: 100 | Fill #0

## 2020-08-14 MED FILL — GLIMEPIRIDE 4 MG TABS: 4 | 30 days supply | Qty: 30 | Fill #0

## 2020-08-14 MED FILL — METFORMIN HCL 1000 MG TABS: 1000 | 30 days supply | Qty: 60 | Fill #0

## 2020-08-14 NOTE — Patient Instructions (Signed)
Diabetes mellitus y nutricin, en adultos Diabetes Mellitus and Nutrition, Adult Si sufre de diabetes (diabetes mellitus), es muy importante tener hbitos alimenticios saludables debido a que sus niveles de azcar en la sangre (glucosa) se ven afectados en gran medida por lo que come y bebe. Comer alimentos saludables en las cantidades adecuadas, aproximadamente a la misma hora todos los das, lo ayudar a:  Controlar la glucemia.  Disminuir el riesgo de sufrir una enfermedad cardaca.  Mejorar la presin arterial.  Alcanzar o mantener un peso saludable. Todas las personas que sufren de diabetes son diferentes y cada una tiene necesidades diferentes en cuanto a un plan de alimentacin. El mdico puede recomendarle que trabaje con un especialista en dietas y nutricin (nutricionista) para elaborar el mejor plan para usted. Su plan de alimentacin puede variar segn factores como:  Las caloras que necesita.  Los medicamentos que toma.  Su peso.  Sus niveles de glucemia, presin arterial y colesterol.  Su nivel de actividad.  Otras afecciones que tenga, como enfermedades cardacas o renales. Cmo me afectan los carbohidratos? Los carbohidratos, o hidratos de carbono, afectan su nivel de glucemia ms que cualquier otro tipo de alimento. La ingesta de carbohidratos naturalmente aumenta la cantidad de glucosa en la sangre. El recuento de carbohidratos es un mtodo destinado a llevar un registro de la cantidad de carbohidratos que se consumen. El recuento de carbohidratos es importante para mantener la glucemia a un nivel saludable, especialmente si utiliza insulina o toma determinados medicamentos por va oral para la diabetes. Es importante conocer la cantidad de carbohidratos que se pueden ingerir en cada comida sin correr ningn riesgo. Esto es diferente en cada persona. Su nutricionista puede ayudarlo a calcular la cantidad de carbohidratos que debe ingerir en cada comida y en cada  refrigerio. Entre los alimentos que contienen carbohidratos, se incluyen:  Pan, cereal, arroz, pastas y galletas.  Papas y maz.  Guisantes, frijoles y lentejas.  Leche y yogur.  Frutas y jugo.  Postres, como pasteles, galletas, helado y caramelos. Cmo me afecta el alcohol? El alcohol puede provocar disminuciones sbitas de la glucemia (hipoglucemia), especialmente si utiliza insulina o toma determinados medicamentos por va oral para la diabetes. La hipoglucemia es una afeccin potencialmente mortal. Los sntomas de la hipoglucemia (somnolencia, mareos y confusin) son similares a los sntomas de haber consumido demasiado alcohol. Si el mdico afirma que el alcohol es seguro para usted, siga estas pautas:  Limite el consumo de alcohol a no ms de 1medida por da si es mujer y no est embarazada, y a 2medidas si es hombre. Una medida equivale a 12oz (355ml) de cerveza, 5oz (148ml) de vino o 1oz (44ml) de bebidas alcohlicas de alta graduacin.  No beba con el estmago vaco.  Mantngase hidratado bebiendo agua, refrescos dietticos o t helado sin azcar.  Tenga en cuenta que los refrescos comunes, los jugos y otras bebida para mezclar pueden contener mucha azcar y se deben contar como carbohidratos. Cules son algunos consejos para seguir este plan?  Leer las etiquetas de los alimentos  Comience por leer el tamao de la porcin en la "Informacin nutricional" en las etiquetas de los alimentos envasados y las bebidas. La cantidad de caloras, carbohidratos, grasas y otros nutrientes mencionados en la etiqueta se basan en una porcin del alimento. Muchos alimentos contienen ms de una porcin por envase.  Verifique la cantidad total de gramos (g) de carbohidratos totales en una porcin. Puede calcular la cantidad de porciones de carbohidratos al dividir el   total de carbohidratos por 15. Por ejemplo, si un alimento tiene un total de 30g de carbohidratos, equivale a 2  porciones de carbohidratos.  Verifique la cantidad de gramos (g) de grasas saturadas y grasas trans en una porcin. Escoja alimentos que no contengan grasa o que tengan un bajo contenido.  Verifique la cantidad de miligramos (mg) de sal (sodio) en una porcin. La mayora de las personas deben limitar la ingesta de sodio total a menos de 2300mg por da.  Siempre consulte la informacin nutricional de los alimentos etiquetados como "con bajo contenido de grasa" o "sin grasa". Estos alimentos pueden tener un mayor contenido de azcar agregada o carbohidratos refinados, y deben evitarse.  Hable con su nutricionista para identificar sus objetivos diarios en cuanto a los nutrientes mencionados en la etiqueta. Al ir de compras  Evite comprar alimentos procesados, enlatados o precocinados. Estos alimentos tienden a tener una mayor cantidad de grasa, sodio y azcar agregada.  Compre en la zona exterior de la tienda de comestibles. Esta zona incluye frutas y verduras frescas, granos a granel, carnes frescas y productos lcteos frescos. Al cocinar  Utilice mtodos de coccin a baja temperatura, como hornear, en lugar de mtodos de coccin a alta temperatura, como frer en abundante aceite.  Cocine con aceites saludables, como el aceite de oliva, canola o girasol.  Evite cocinar con manteca, crema o carnes con alto contenido de grasa. Planificacin de las comidas  Coma las comidas y los refrigerios regularmente, preferentemente a la misma hora todos los das. Evite pasar largos perodos de tiempo sin comer.  Consuma alimentos ricos en fibra, como frutas frescas, verduras, frijoles y cereales integrales. Consulte a su nutricionista sobre cuntas porciones de carbohidratos puede consumir en cada comida.  Consuma entre 4 y 6 onzas (oz) de protenas magras por da, como carnes magras, pollo, pescado, huevos o tofu. Una onza de protena magra equivale a: ? 1 onza de carne, pollo o  pescado. ? 1huevo. ?  taza de tofu.  Coma algunos alimentos por da que contengan grasas saludables, como aguacates, frutos secos, semillas y pescado. Estilo de vida  Controle su nivel de glucemia con regularidad.  Haga actividad fsica habitualmente como se lo haya indicado el mdico. Esto puede incluir lo siguiente: ? 150minutos semanales de ejercicio de intensidad moderada o alta. Esto podra incluir caminatas dinmicas, ciclismo o gimnasia acutica. ? Realizar ejercicios de elongacin y de fortalecimiento, como yoga o levantamiento de pesas, por lo menos 2veces por semana.  Tome los medicamentos como se lo haya indicado el mdico.  No consuma ningn producto que contenga nicotina o tabaco, como cigarrillos y cigarrillos electrnicos. Si necesita ayuda para dejar de fumar, consulte al mdico.  Trabaje con un asesor o instructor en diabetes para identificar estrategias para controlar el estrs y cualquier desafo emocional y social. Preguntas para hacerle al mdico  Es necesario que consulte a un instructor en el cuidado de la diabetes?  Es necesario que me rena con un nutricionista?  A qu nmero puedo llamar si tengo preguntas?  Cules son los mejores momentos para controlar la glucemia? Dnde encontrar ms informacin:  Asociacin Estadounidense de la Diabetes (American Diabetes Association): diabetes.org  Academia de Nutricin y Diettica (Academy of Nutrition and Dietetics): www.eatright.org  Instituto Nacional de la Diabetes y las Enfermedades Digestivas y Renales (National Institute of Diabetes and Digestive and Kidney Diseases, NIH): www.niddk.nih.gov Resumen  Un plan de alimentacin saludable lo ayudar a controlar la glucemia y mantener un estilo de vida saludable.    Trabajar con un especialista en dietas y nutricin (nutricionista) puede ayudarlo a elaborar el mejor plan de alimentacin para usted.  Tenga en cuenta que los carbohidratos (hidratos de  carbono) y el alcohol tienen efectos inmediatos en sus niveles de glucemia. Es importante contar los carbohidratos que ingiere y consumir alcohol con prudencia. Esta informacin no tiene como fin reemplazar el consejo del mdico. Asegrese de hacerle al mdico cualquier pregunta que tenga. Document Revised: 05/26/2017 Document Reviewed: 01/05/2017 Elsevier Patient Education  2020 Elsevier Inc.  

## 2020-08-14 NOTE — Progress Notes (Signed)
Established Patient Office Visit  Subjective:  Patient ID: Erika Bates, female    DOB: [birth year]  Age: 69 y.o. MRN: [id number]  CC:  Chief Complaint  Patient presents with  . Diabetes    HPI Erika Bates is a 69 year old female Spanish female who presents for the management of diabetes and hypertension.  Has not seen a provider or been on medication since the pandemic.  She denies polyphagia, and polyuria. Blood pressure is unremarkable  Past Medical History:  Diagnosis Date  . Dialysis patient [facility]) [phone]  . Hypertension   . Myocardial infarction ([place]) 01/2018  . Pre-diabetes     Past Surgical History:  Procedure Laterality Date  . ABDOMINAL EXPLORATION SURGERY  >1990   "they thought it was problems w/my gallbladder; said it was my pancreas but they didn't take that out" (02/16/2018)  . CARDIAC CATHETERIZATION  07/29/2018  . CORONARY ARTERY BYPASS GRAFT N/A 08/02/2018   Procedure: CORONARY ARTERY BYPASS GRAFTING (CABG) times 2 using left internal mammary artery to LAD  and left greater saphenous vein to Ramus harvested endoscopically.;  Surgeon: Grace Isaac, MD;  Location: [facility];  Service: Open Heart Surgery;  Laterality: N/A;  . CORONARY STENT INTERVENTION N/A 02/16/2018   Procedure: CORONARY STENT INTERVENTION;  Surgeon: Leonie Man, MD;  Location: [place] [facility];  Service: Cardiovascular;  Laterality: N/A;  . LEFT HEART CATH AND CORONARY ANGIOGRAPHY N/A 02/16/2018   Procedure: LEFT HEART CATH AND CORONARY ANGIOGRAPHY;  Surgeon: Leonie Man, MD;  Location: [place] [facility];  Service: Cardiovascular;  Laterality: N/A;  . LEFT HEART CATH AND CORONARY ANGIOGRAPHY N/A 07/29/2018   Procedure: LEFT HEART CATH AND CORONARY ANGIOGRAPHY;  Surgeon: Lorretta Harp, MD;  Location: [place] [facility];  Service: Cardiovascular;  Laterality: N/A;  . TEE WITHOUT CARDIOVERSION N/A 08/02/2018   Procedure: TRANSESOPHAGEAL ECHOCARDIOGRAM (TEE);  Surgeon:  Grace Isaac, MD;  Location: [facility];  Service: Open Heart Surgery;  Laterality: N/A;  . TUBAL LIGATION      Family History  Problem Relation Age of Onset  . Hypertension Father     Social History   Socioeconomic History  . Marital status: Significant Other    Spouse name: Not on file  . Number of children: Not on file  . Years of education: Not on file  . Highest education level: Not on file  Occupational History  . Not on file  Tobacco Use  . Smoking status: Never Smoker  . Smokeless tobacco: Never Used  Vaping Use  . Vaping Use: Never used  Substance and Sexual Activity  . Alcohol use: No  . Drug use: No  . Sexual activity: Not Currently  Other Topics Concern  . Not on file  Social History Narrative  . Not on file   Social Determinants of Health   Financial Resource Strain:   . Difficulty of Paying Living Expenses: Not on file  Food Insecurity:   . Worried About Charity fundraiser in the Last Year: Not on file  . Ran Out of Food in the Last Year: Not on file  Transportation Needs:   . Lack of Transportation (Medical): Not on file  . Lack of Transportation (Non-Medical): Not on file  Physical Activity:   . Days of Exercise per Week: Not on file  . Minutes of Exercise per Session: Not on file  Stress:   . Feeling of Stress : Not on file  Social Connections:   .  Frequency of Communication with Friends and Family: Not on file  . Frequency of Social Gatherings with Friends and Family: Not on file  . Attends Religious Services: Not on file  . Active Member of Clubs or Organizations: Not on file  . Attends Archivist Meetings: Not on file  . Marital Status: Not on file  Intimate Partner Violence:   . Fear of Current or Ex-Partner: Not on file  . Emotionally Abused: Not on file  . Physically Abused: Not on file  . Sexually Abused: Not on file    Outpatient Medications Prior to Visit  Medication Sig Dispense Refill  . aspirin EC 81 MG EC tablet  Take 1 tablet (81 mg total) by mouth daily. 30 tablet 11  . clopidogrel (PLAVIX) 75 MG tablet Take 1 tablet (75 mg total) by mouth daily. 90 tablet 3  . metFORMIN (GLUCOPHAGE) 1000 MG tablet Take 1 tablet (1,000 mg total) by mouth 2 (two) times daily with a meal. 180 tablet 1  . acetaminophen (TYLENOL) 325 MG tablet Take 2 tablets (650 mg total) by mouth every 6 (six) hours as needed for mild pain. (Patient not taking: Reported on 08/14/2020)    . atorvastatin (LIPITOR) 80 MG tablet Take 1 tablet (80 mg total) by mouth daily at 6 PM. (Patient not taking: Reported on 08/14/2020) 90 tablet 3  . blood glucose meter kit and supplies KIT Dispense based on patient and insurance preference. Use up to four times daily as directed. (FOR ICD-9 250.00, 250.01). (Patient not taking: Reported on 08/14/2020) 1 each 0  . glimepiride (AMARYL) 4 MG tablet Take 1 tablet (4 mg total) by mouth daily before breakfast. MUST MAKE APPT FOR FURTHER REFILLS (Patient not taking: Reported on 08/14/2020) 90 tablet 1  . lisinopril (ZESTRIL) 2.5 MG tablet Take 1 tablet daily for renal protection per ADA (Patient not taking: Reported on 08/14/2020) 90 tablet 1   No facility-administered medications prior to visit.    No Known Allergies  ROS Review of Systems  All other systems reviewed and are negative.     Objective:    Physical Exam Vitals reviewed.  Constitutional:      Appearance: She is obese.  HENT:     Head: Normocephalic.     Right Ear: Tympanic membrane normal.     Left Ear: Tympanic membrane normal.     Nose: Nose normal.  Eyes:     Extraocular Movements: Extraocular movements intact.     Pupils: Pupils are equal, round, and reactive to light.  Cardiovascular:     Rate and Rhythm: Normal rate and regular rhythm.  Pulmonary:     Effort: Pulmonary effort is normal.     Breath sounds: Normal breath sounds.  Abdominal:     General: Bowel sounds are normal. There is distension.     Palpations: Abdomen  is soft.  Musculoskeletal:        General: Normal range of motion.     Cervical back: Normal range of motion and neck supple.  Skin:    General: Skin is warm.  Neurological:     Mental Status: She is alert and oriented to person, place, and time.  Psychiatric:        Mood and Affect: Mood normal.        Behavior: Behavior normal.        Thought Content: Thought content normal.        Judgment: Judgment normal.     BP 114/68 (BP Location:  Right Arm, Patient Position: Sitting, Cuff Size: Large)   Pulse 68   Temp (!) 97.2 F (36.2 C) (Temporal)   Ht _0  (1.6 m)   Wt 215 lb 12.8 oz (97.9 kg)   SpO2 92%   BMI 38.23 kg/m  Wt Readings from Last 3 Encounters:  08/14/20 215 lb 12.8 oz (97.9 kg)  08/11/19 216 lb 6.4 oz (98.2 kg)  05/03/19 221 lb 9.6 oz (100.5 kg)     Health Maintenance Due  Topic Date Due  . OPHTHALMOLOGY EXAM  Never done  . COVID-19 Vaccine (1) Never done  . MAMMOGRAM  Never done  . Fecal DNA (Cologuard)  Never done  . DEXA SCAN  Never done    There are no preventive care reminders to display for this patient.  No results found for: TSH Lab Results  Component Value Date   WBC 9.1 08/11/2019   HGB 12.4 08/11/2019   HCT 38.6 08/11/2019   MCV 83 08/11/2019   PLT 256 08/11/2019   Lab Results  Component Value Date   NA 139 08/11/2019   K 4.3 08/11/2019   CO2 23 08/11/2019   GLUCOSE 127 (H) 08/11/2019   BUN 16 08/11/2019   CREATININE 1.50 (H) 08/11/2019   BILITOT 0.4 08/11/2019   ALKPHOS 93 08/11/2019   AST 14 08/11/2019   ALT 10 08/11/2019   PROT 7.5 08/11/2019   ALBUMIN 3.9 08/11/2019   CALCIUM 9.0 08/11/2019   ANIONGAP 8 08/07/2018   Lab Results  Component Value Date   CHOL 100 08/11/2019   Lab Results  Component Value Date   HDL 37 (L) 08/11/2019   Lab Results  Component Value Date   LDLCALC 43 08/11/2019   Lab Results  Component Value Date   TRIG 107 08/11/2019   Lab Results  Component Value Date   CHOLHDL 2.7 08/11/2019    Lab Results  Component Value Date   HGBA1C 10.8 (A) 08/14/2020      Assessment & Plan:  Barabara was seen today for diabetes.  Diagnoses and all orders for this visit:  Type 2 diabetes mellitus without complication, without long-term current use of insulin ([place]) ADA recommends the following therapeutic goals for glycemic control related to A1c measurements: Goal of therapy: Less than 6.5 hemoglobin A1c.  Reference clinical practice recommendations. Foods that are high in carbohydrates are the following rice, potatoes, breads, sugars, and pastas.  Reduction in the intake (eating) will assist in lowering your blood sugars. F/U with clinical pharmacist to learn how to inject insulin  -     HgB A1c -     Glucose (CBG) -     Discontinue: glimepiride (AMARYL) 4 MG tablet; Take 1 tablet (4 mg total) by mouth daily before breakfast. MUST MAKE APPT FOR FURTHER REFILLS -     glimepiride (AMARYL) 4 MG tablet; Take 1 tablet (4 mg total) by mouth daily before breakfast. MUST MAKE APPT FOR FURTHER REFILLS -     CBC with Differential/Platelet; Future -     Ambulatory referral to Ophthalmology  Essential hypertension Unremarkable  ACE for renal protection  -     CMP14+EGFR; Future  Hyperlipidemia LDL goal <70 -     Lipid panel; Future  Encounter for screening mammogram for malignant neoplasm of breast Patient completed application for BCCP while in clinic and application has and faxed to [facility]. Patient aware that [facility] will contact her directly to schedule appointment.  Special screening for malignant neoplasms, colon -  FOBT  Other orders -     Discontinue: metFORMIN (GLUCOPHAGE) 1000 MG tablet; Take 1 tablet (1,000 mg total) by mouth 2 (two) times daily with a meal. -     insulin glargine (LANTUS SOLOSTAR) 100 UNIT/ML Solostar Pen; Inject 12 Units into the skin daily. -     metFORMIN (GLUCOPHAGE) 1000 MG tablet; Take 1 tablet (1,000 mg total) by mouth 2 (two) times daily with a meal. -      blood glucose meter kit and supplies KIT; Dispense based on patient and insurance preference. Use up to four times daily as directed. (FOR ICD-9 250.00, 250.01). -     lisinopril (ZESTRIL) 2.5 MG tablet; Take 1 tablet daily for renal protection per ADA    Meds ordered this encounter  Medications  . DISCONTD: metFORMIN (GLUCOPHAGE) 1000 MG tablet    Sig: Take 1 tablet (1,000 mg total) by mouth 2 (two) times daily with a meal.    Dispense:  180 tablet    Refill:  1  . DISCONTD: glimepiride (AMARYL) 4 MG tablet    Sig: Take 1 tablet (4 mg total) by mouth daily before breakfast. MUST MAKE APPT FOR FURTHER REFILLS    Dispense:  90 tablet    Refill:  1  . insulin glargine (LANTUS SOLOSTAR) 100 UNIT/ML Solostar Pen    Sig: Inject 12 Units into the skin daily.    Dispense:  15 mL    Refill:  PRN  . glimepiride (AMARYL) 4 MG tablet    Sig: Take 1 tablet (4 mg total) by mouth daily before breakfast. MUST MAKE APPT FOR FURTHER REFILLS    Dispense:  90 tablet    Refill:  1  . metFORMIN (GLUCOPHAGE) 1000 MG tablet    Sig: Take 1 tablet (1,000 mg total) by mouth 2 (two) times daily with a meal.    Dispense:  180 tablet    Refill:  1  . blood glucose meter kit and supplies KIT    Sig: Dispense based on patient and insurance preference. Use up to four times daily as directed. (FOR ICD-9 250.00, 250.01).    Dispense:  1 each    Refill:  0    Order Specific Question:   Number of strips    Answer:   100    Order Specific Question:   Number of lancets    Answer:   100  . lisinopril (ZESTRIL) 2.5 MG tablet    Sig: Take 1 tablet daily for renal protection per ADA    Dispense:  90 tablet    Refill:  1    Follow-up: Return in about 3 months (around 11/14/2020) for Schedule ASAP with Lurena Joiner for insulin instructions- PCP.    Kerin Perna, NP

## 2020-08-15 ENCOUNTER — Encounter: Payer: Self-pay | Admitting: Pharmacist

## 2020-08-15 ENCOUNTER — Other Ambulatory Visit: Payer: Self-pay | Admitting: Family Medicine

## 2020-08-15 ENCOUNTER — Ambulatory Visit: Payer: Self-pay | Attending: Primary Care | Admitting: Pharmacist

## 2020-08-15 DIAGNOSIS — E119 Type 2 diabetes mellitus without complications: Secondary | ICD-10-CM

## 2020-08-15 DIAGNOSIS — E785 Hyperlipidemia, unspecified: Secondary | ICD-10-CM

## 2020-08-15 DIAGNOSIS — I10 Essential (primary) hypertension: Secondary | ICD-10-CM

## 2020-08-15 MED ORDER — TRUEPLUS PEN NEEDLES 32G X 4 MM MISC: 2 refills | Status: DC

## 2020-08-15 MED FILL — TRUEplus 5-BEVEL PEN NEEDLE: 32G X 4 MM | 30 days supply | Qty: 100 | Fill #0

## 2020-08-15 NOTE — Progress Notes (Signed)
° ° °  S:     PCP: Gwinda Passe, NP PMH: T2DM,  HTN, MI (CAD s/p CABG) in 2019, HLD, CRI (chronic renal insufficiency), stage 3  Patient arrives in good spirits accompanied by her daughter as Nurse, learning disability. Presents for insulin injection teaching. Patient was referred and last seen by Primary Care Provider on 08/15/20 at which Lantus 12 units daily was started.  Family/Social History:  -Fhx: father with hypertension -Tobacco use: denies -Alcohol use: denies  Insurance coverage/medication affordability: none  Current diabetes medications include: Lantus 12 units daily, glimepiride 4 mg daily, metformin 1000 mg BID Current hypertension medications include: lisinopril 2.5 mg (renal protection) Current hyperlipidemia medications include: none  O:   Lab Results  Component Value Date   HGBA1C 10.8 (A) 08/14/2020   There were no vitals filed for this visit.  Lipid Panel     Component Value Date/Time   CHOL 100 08/11/2019 1145   TRIG 107 08/11/2019 1145   HDL 37 (L) 08/11/2019 1145   CHOLHDL 2.7 08/11/2019 1145   CHOLHDL 3.5 07/30/2018 0335   VLDL 27 07/30/2018 0335   LDLCALC 43 08/11/2019 1145    Clinical Atherosclerotic Cardiovascular Disease (ASCVD): Yes  The ASCVD Risk score Denman George DC Jr., et al., 2013) failed to calculate for the following reasons:   The patient has a prior MI or stroke diagnosis   A/P: Diabetes longstanding currently uncontrolled. Patient is able to verbalize appropriate hypoglycemia management plan. Patient and daughter was educated on proper injection technique with Lantus insulin demo pen. Reviewed necessary supplies and operation of the pen. Patient and daughter was able to demonstrate use. All questions and concerns were addressed. Additionally, patient is a good candidate for GLP-1 agonist (Victoza) for diabetes management, and for renal and cardiovascular benefit. Will address at next visit.  -Continued Lantus 12 units daily -Continued glimepiride 4  mg daily -Continued metformin 1000 mg BID -Sent prescription for pen needles  ASCVD risk - secondary prevention in patient with diabetes. Patient not on a statin. High intensity statin is recommended. Aspirin is indicated.  -Recommend statin pending lipid panel results -Continued aspirin 81 mg   ASCVD secondary prevention - history of myocardinal infarction and CAD s/p CABG.  -Good candidate for beta blocker for secondary prevention. Will address at next visit.  Written patient instructions provided. Total time in face to face counseling 20 minutes.   Follow up Pharmacist Clinic Visit in 3 weeks.     Fabio Neighbors, PharmD PGY2 Ambulatory Care Resident [facility]   Pharmacy   Butch Penny, PharmD, CPP Clinical Pharmacist [facility] & [facility] [phone]

## 2020-08-16 LAB — CMP14+EGFR
ALT: 8 IU/L (ref 0–32)
AST: 11 IU/L (ref 0–40)
Albumin/Globulin Ratio: 1.1 — ABNORMAL LOW (ref 1.2–2.2)
Albumin: 3.9 g/dL (ref 3.8–4.8)
Alkaline Phosphatase: 74 IU/L (ref 44–121)
BUN/Creatinine Ratio: 12 (ref 12–28)
BUN: 16 mg/dL (ref 8–27)
Bilirubin Total: 0.4 mg/dL (ref 0.0–1.2)
CO2: 22 mmol/L (ref 20–29)
Calcium: 9.3 mg/dL (ref 8.7–10.3)
Chloride: 103 mmol/L (ref 96–106)
Creatinine, Ser: 1.38 mg/dL — ABNORMAL HIGH (ref 0.57–1.00)
GFR calc Af Amer: 46 mL/min/{1.73_m2} — ABNORMAL LOW (ref >59)
GFR calc non Af Amer: 40 mL/min/{1.73_m2} — ABNORMAL LOW (ref >59)
Globulin, Total: 3.7 g/dL (ref 1.5–4.5)
Glucose: 151 mg/dL — ABNORMAL HIGH (ref 65–99)
Potassium: 4.3 mmol/L (ref 3.5–5.2)
Sodium: 138 mmol/L (ref 134–144)
Total Protein: 7.6 g/dL (ref 6.0–8.5)

## 2020-08-16 LAB — CBC WITH DIFFERENTIAL/PLATELET
Basophils Absolute: 0.1 10*3/uL (ref 0.0–0.2)
Basos: 1 %
EOS (ABSOLUTE): 0.2 10*3/uL (ref 0.0–0.4)
Eos: 3 %
Hematocrit: 42.9 % (ref 34.0–46.6)
Hemoglobin: 13.4 g/dL (ref 11.1–15.9)
Immature Grans (Abs): 0 10*3/uL (ref 0.0–0.1)
Immature Granulocytes: 0 %
Lymphocytes Absolute: 3.1 10*3/uL (ref 0.7–3.1)
Lymphs: 45 %
MCH: 27 pg (ref 26.6–33.0)
MCHC: 31.2 g/dL — ABNORMAL LOW (ref 31.5–35.7)
MCV: 87 fL (ref 79–97)
Monocytes Absolute: 0.3 10*3/uL (ref 0.1–0.9)
Monocytes: 5 %
Neutrophils Absolute: 3.2 10*3/uL (ref 1.4–7.0)
Neutrophils: 46 %
Platelets: 244 10*3/uL (ref 150–450)
RBC: 4.96 x10E6/uL (ref 3.77–5.28)
RDW: 12.3 % (ref 11.7–15.4)
WBC: 6.9 10*3/uL (ref 3.4–10.8)

## 2020-08-16 LAB — LIPID PANEL
Chol/HDL Ratio: 4.8 ratio — ABNORMAL HIGH (ref 0.0–4.4)
Cholesterol, Total: 179 mg/dL (ref 100–199)
HDL: 37 mg/dL — ABNORMAL LOW (ref >39)
LDL Chol Calc (NIH): 110 mg/dL — ABNORMAL HIGH (ref 0–99)
Triglycerides: 183 mg/dL — ABNORMAL HIGH (ref 0–149)
VLDL Cholesterol Cal: 32 mg/dL (ref 5–40)

## 2020-08-17 ENCOUNTER — Other Ambulatory Visit (INDEPENDENT_AMBULATORY_CARE_PROVIDER_SITE_OTHER): Payer: Self-pay | Admitting: Primary Care

## 2020-08-17 DIAGNOSIS — E785 Hyperlipidemia, unspecified: Secondary | ICD-10-CM

## 2020-08-17 MED ORDER — PRAVASTATIN SODIUM 40 MG PO TABS: 40.0000 mg | ORAL_TABLET | Freq: Every day | ORAL | 3 refills | Status: DC

## 2020-08-20 ENCOUNTER — Telehealth (INDEPENDENT_AMBULATORY_CARE_PROVIDER_SITE_OTHER): Payer: Self-pay

## 2020-08-20 NOTE — Telephone Encounter (Signed)
-----   Message from Grayce Sessions, NP sent at 08/17/2020  7:47 PM EST ----- Elevated  cholesterol that can lead to heart attack and stroke. To lower your number you can decrease your fatty foods, red meat, cheese, milk and increase fiber like whole grains and veggies. Sent in pravastatin 40mg  take at bedtime to [facility] pharmacy  Kidney function has decline will monitor

## 2020-08-20 NOTE — Telephone Encounter (Signed)
Patients daughter is aware that patients has Elevated cholesterol that can lead to heart attack and stroke. To lower your number you can decrease your fatty foods, red meat, cheese, milk and increase fiber like whole grains and veggies. Sent in pravastatin 40mg  take at bedtime to [facility] pharmacy  Kidney function has decline will monitor. She verbalized understanding. She will go pick up new Rx for cholesterol medication. [facility], CMA

## 2020-09-05 ENCOUNTER — Ambulatory Visit: Payer: Self-pay | Attending: Primary Care | Admitting: Pharmacist

## 2020-09-05 ENCOUNTER — Other Ambulatory Visit: Payer: Self-pay

## 2020-09-05 ENCOUNTER — Encounter: Payer: Self-pay | Admitting: Pharmacist

## 2020-09-05 ENCOUNTER — Other Ambulatory Visit: Payer: Self-pay | Admitting: Family Medicine

## 2020-09-05 DIAGNOSIS — Z794 Long term (current) use of insulin: Secondary | ICD-10-CM

## 2020-09-05 DIAGNOSIS — E119 Type 2 diabetes mellitus without complications: Secondary | ICD-10-CM

## 2020-09-05 LAB — GLUCOSE, POCT (MANUAL RESULT ENTRY): POC Glucose: 76 mg/dl (ref 70–99)

## 2020-09-05 MED ORDER — LANTUS SOLOSTAR 100 UNIT/ML ~~LOC~~ SOPN: 10.0000 [IU] | PEN_INJECTOR | Freq: Every day | SUBCUTANEOUS | 99 refills | Status: DC

## 2020-09-05 MED FILL — PRAVASTATIN NA 40 MG TAB: 40 | 30 days supply | Qty: 30 | Fill #0

## 2020-09-05 NOTE — Progress Notes (Signed)
    S:     PCP: Gwinda Passe, NP PMH: T2DM,  HTN, MI (CAD s/p CABG) in 2019, HLD, CRI (chronic renal insufficiency), stage 3  Patient arrives in good spirits accompanied by her daughter as Nurse, learning disability. Presents for insulin injection teaching. Patient was referred and last seen by Primary Care Provider on 08/14/20. Pharmacy saw her 08/15/2020 and provided education.   Family/Social History:  -Fhx: father with hypertension -Tobacco use: denies -Alcohol use: denies  Insurance coverage/medication affordability: none  Patient reports adherence to medications.  Current diabetes medications include: Lantus 12 units daily, glimepiride 4 mg daily, metformin 1000 mg BID Current hypertension medications include: lisinopril 2.5 mg (renal protection) Current hyperlipidemia medications include: pravastatin 40 mg daily? Was previously on atorvastatin with great control.   Patient endorses hypoglycemia symptoms: shaking, fatigue. Occurs almost daily but does not check her sugar.   Patient denies polyuria, polydipsia, blurred vision and neuropathy.   O:  POCT glucose: 74   Lab Results  Component Value Date   HGBA1C 10.8 (A) 08/14/2020   There were no vitals filed for this visit.  Lipid Panel     Component Value Date/Time   CHOL 179 08/15/2020 0900   TRIG 183 (H) 08/15/2020 0900   HDL 37 (L) 08/15/2020 0900   CHOLHDL 4.8 (H) 08/15/2020 0900   CHOLHDL 3.5 07/30/2018 0335   VLDL 27 07/30/2018 0335   LDLCALC 110 (H) 08/15/2020 0900    Clinical Atherosclerotic Cardiovascular Disease (ASCVD): Yes  The ASCVD Risk score Denman George DC Jr., et al., 2013) failed to calculate for the following reasons:   The patient has a prior MI or stroke diagnosis   Home CBGs: not checking  A/P: Diabetes longstanding currently uncontrolled. Additionally, she is experiencing daily hypoglycemia. Patient is able to verbalize appropriate hypoglycemia management plan. Additionally, patient is a good candidate for  GLP-1 agonist (Victoza) for diabetes management, and for renal and cardiovascular benefit. Will address at next visit.  -Decrease Lantus to 10 units daily -Discontinued glimepiride 4 mg daily -Continued metformin 1000 mg BID  ASCVD risk - secondary prevention in patient with diabetes. Patient not on a statin. High intensity statin is recommended. Aspirin is indicated. Pt is on pravastatin but maintained a goal LDL < 70 on atorvastatin 80mg  daily previously. Will address at next visit.   ASCVD secondary prevention - history of myocardinal infarction and CAD s/p CABG without a beta blocker. She has tried carvedilol before but did not tolerate this well. Caused bradycardia and fatigue. She needs to get engaged with Cardiology. Last seen in 2020.   Written patient instructions provided. Total time in face to face counseling 20 minutes.   Follow up Pharmacist Clinic Visit in 3 weeks.     2021, PharmD, Butch Penny, CPP Clinical Pharmacist [facility] & [facility] [phone]

## 2020-09-19 ENCOUNTER — Ambulatory Visit: Payer: Self-pay | Admitting: Pharmacist

## 2020-10-02 NOTE — Progress Notes (Signed)
S:    PCP: Gwinda Passe PMH: T2DM,  HTN, MI (CAD s/p CABG) in 2019, HLD, CRI (chronic renal insufficiency), stage 3  Patient arrives in good spirits accompanied by her daughter as Nurse, learning disability. Presents for diabetes evaluation, education, and management. Patient was referred and last seen by Primary Care Provider on 08/14/20. Pharmacy saw her 09/05/20 and BG 74. Pt also reported daily hypoglycemia. Lantus was decreased from 12 units to 10 units daily and glimepiride was discontinued.    Today, patient reports medication adherence with Lantus 10 units daily and metformin twice daily. Reports not taking Lantus yet this morning and had a large breakfast consisting of potatoes. Reports checking fasting sugars daily ranging from 141-270. Denies symptoms of hypoglycemia. Discussed current diet and exercise (see below)  Family/Social History:  -Fhx: father with hypertension -Tobacco use: denies -Alcohol use: denies  Insurance coverage/medication affordability: dnbi  Medication adherence reported good.   Current diabetes medications include: Lantus 10 units daily, metformin 1000 mg BID Current hypertension medications include: lisinopril 2.5 mg (renal protection) Current hyperlipidemia medications include: pravastatin 40 mg daily? Was previously on atorvastatin with great control.   Patient denies hypoglycemic events.  Patient reported dietary habits: Eats 2-3 meals/day Breakfast: bread, coffee,  Lunch/dinner: fish, rice, sweet potato, some vegetables Snacks: saltine crackers, little cookies  Drinks: soda and water   Patient-reported exercise habits: walks outside around the house in the mornings and walks to friends houses too   Patient reports nocturia (nighttime urination). (3x/night) Patient reports neuropathy (nerve pain). Patient reports visual changes. Patient reports self foot exams.    O:  POCT: 332 (post prandial)  Home fasting blood sugars: 250, 258, 270, 177, 243,  187, 141, 182, 176 2 hour post-meal/random blood sugars: 231  Lab Results  Component Value Date   HGBA1C 10.8 (A) 08/14/2020   There were no vitals filed for this visit.  Lipid Panel     Component Value Date/Time   CHOL 179 08/15/2020 0900   TRIG 183 (H) 08/15/2020 0900   HDL 37 (L) 08/15/2020 0900   CHOLHDL 4.8 (H) 08/15/2020 0900   CHOLHDL 3.5 07/30/2018 0335   VLDL 27 07/30/2018 0335   LDLCALC 110 (H) 08/15/2020 0900    Clinical Atherosclerotic Cardiovascular Disease (ASCVD): Yes  The ASCVD Risk score Denman George DC Jr., et al., 2013) failed to calculate for the following reasons:   The patient has a prior MI or stroke diagnosis   A/P: Diabetes longstanding currently uncontrolled. Medication adherence appears optimal and hypoglycemia has improved since reducing Lantus from 12 units to 10 units daily. Patient is a good candidate for GLP-1 agonist (Victoza) for diabetes management, and for renal and cardiovascular benefit. Discussed clinical benefits, potential side-effects, and demonstrated proper injection technique with teach back method. Patient is able to verbalize appropriate hypoglycemia management plan. Encouraged patient to aim for a diet full of vegetables, fruit and lean meats (chicken, [place], fish) and to limit carbs (bread, pasta, sugar, rice) and red meat consumption. Encouraged patient to exercise 20-30 minutes daily with the goal of 150 minutes per week. Patient verbalized understanding. -Started Victoza 0.6 mg daily x1 week, then increase to 1.2 mg daily -Decreased Lantus from 10 units to 6 units daily -Continued metformin 1000 mg BID -Extensively discussed pathophysiology of diabetes, recommended lifestyle interventions, dietary effects on blood sugar control -Counseled on s/sx of and management of hypoglycemia -Next A1C anticipated February 2022.   ASCVD risk - secondary prevention in patient with diabetes. Last LDL is  not controlled. high intensity statin indicated.  Aspirin is indicated.  Pt is on pravastatin but maintained a goal LDL < 70 on atorvastatin 80mg  daily previously. Will address at next visit.   Written patient instructions provided.  Total time in face to face counseling 30 minutes.   Follow up Pharmacist Clinic Visit in 2 weeks.   , PharmD, BCPS PGY2 Ambulatory Care Resident [facility]  Pharmacy

## 2020-10-03 ENCOUNTER — Ambulatory Visit: Payer: Self-pay | Attending: Family Medicine | Admitting: Pharmacist

## 2020-10-03 ENCOUNTER — Encounter: Payer: Self-pay | Admitting: Pharmacist

## 2020-10-03 ENCOUNTER — Other Ambulatory Visit: Payer: Self-pay

## 2020-10-03 ENCOUNTER — Other Ambulatory Visit: Payer: Self-pay | Admitting: Primary Care

## 2020-10-03 DIAGNOSIS — E119 Type 2 diabetes mellitus without complications: Secondary | ICD-10-CM

## 2020-10-03 DIAGNOSIS — Z794 Long term (current) use of insulin: Secondary | ICD-10-CM

## 2020-10-03 LAB — POCT GLYCOSYLATED HEMOGLOBIN (HGB A1C): Hemoglobin A1C: 8.4 % — AB (ref 4.0–5.6)

## 2020-10-03 LAB — GLUCOSE, POCT (MANUAL RESULT ENTRY): POC Glucose: 332 mg/dl — AB (ref 70–99)

## 2020-10-03 MED ORDER — LANTUS SOLOSTAR 100 UNIT/ML ~~LOC~~ SOPN: 6.0000 [IU] | PEN_INJECTOR | Freq: Every day | SUBCUTANEOUS | 99 refills | Status: DC

## 2020-10-03 MED ORDER — VICTOZA 18 MG/3ML ~~LOC~~ SOPN: PEN_INJECTOR | SUBCUTANEOUS | 3 refills | Status: DC

## 2020-10-03 MED FILL — !VICTOZA 18MG/3ML INJECT: 18 | 30 days supply | Qty: 6 | Fill #0

## 2020-10-03 MED FILL — !LANTUS SOLOSTAR 100UNITS/M: 100 | 28 days supply | Qty: 3 | Fill #0

## 2020-10-15 NOTE — Progress Notes (Unsigned)
S:     PCP: Gwinda Passe PMH: T2DM, HTN, MI (CAD s/p CABG) in 2019, HLD, CRI (chronic renal insufficiency), stage 3  Patient arrives in good spirits accompanied by her daughter as Nurse, learning disability ***. Presents for diabetes evaluation, education, and management. Patient was referred and last seen by Primary Care Provider on 08/14/20. Last seen by pharmacy on 10/04/19 and BG 332 (did not take Lantus prior to visit). Pt started on Victoza 0.6 mg daily x1 week, then 1.2 mg daily and Lantus decreased from 10 units to 6 units daily.  Today, patient reports ***  LV: fasting sugars 141-270  A1C = Check Clinic BG Review medications and adherence (timing of meds, etc.)  Ate or drank anything prior to visit today? At home BGs?  Marland Kitchen Highs . Lows  Hyperglycemia sx (nocturia, neuropathy, visual changes, foot exams) Hypoglycemia symptoms (dizziness, shaky, sweating, hungry, confusion) Diet Exercise   Plan: - Tolerating Victoza - Titrate insulin - Switch pravastatin to atorvastatin - previously on atorv...any issues? -possible candidate for SGLT2  Family/Social History:  -Fhx: father with hypertension -Tobacco use: denies -Alcohol use: denies  Insurance coverage/medication affordability: DNBI  Medication adherence reported *** .   Current diabetes medications include: Victoza 0.6 mg daily, Lantus 6 units daily, metformin 1000 mg BID Current hypertension medications include: lisinopril 2.5 mg (renal protection) Current hyperlipidemia medications include:pravastatin 40 mg daily? Was previously on atorvastatin with great control.  Patient {Actions; denies-reports:120008} hypoglycemic events.  Patient reported dietary habits: Eats 2-3 meals/day Breakfast: bread, coffee,  Lunch/dinner: fish, rice, sweet potato, some vegetables Snacks: saltine crackers, little cookies  Drinks: soda and water   Patient-reported exercise habits: walks outside around the house in the mornings and walks  to friends houses too   Patient {Actions; denies-reports:120008} nocturia (nighttime urination).  Patient {Actions; denies-reports:120008} neuropathy (nerve pain). Patient {Actions; denies-reports:120008} visual changes. Patient {Actions; denies-reports:120008} self foot exams.     O:  POCT:  Home fasting blood sugars: ***  2 hour post-meal/random blood sugars: ***.  Lab Results  Component Value Date   HGBA1C 10.8 (A) 08/14/2020   There were no vitals filed for this visit.  Lipid Panel     Component Value Date/Time   CHOL 179 08/15/2020 0900   TRIG 183 (H) 08/15/2020 0900   HDL 37 (L) 08/15/2020 0900   CHOLHDL 4.8 (H) 08/15/2020 0900   CHOLHDL 3.5 07/30/2018 0335   VLDL 27 07/30/2018 0335   LDLCALC 110 (H) 08/15/2020 0900    Clinical Atherosclerotic Cardiovascular Disease (ASCVD): Yes  The ASCVD Risk score Denman George DC Jr., et al., 2013) failed to calculate for the following reasons:   The patient has a prior MI or stroke diagnosis    A/P: Diabetes longstanding*** currently ***. Patient is *** able to verbalize appropriate hypoglycemia management plan. Medication adherence appears ***. Control is suboptimal due to ***. - -Extensively discussed pathophysiology of diabetes, recommended lifestyle interventions, dietary effects on blood sugar control -Counseled on s/sx of and management of hypoglycemia -Next A1C anticipated February 2022.   ASCVD risk - secondary prevention in patient with diabetes. Last LDL is not controlled. high intensity statin indicated. Aspirin is indicated. Pt is on pravastatin but maintained a goal LDL < 70 on atorvastatin 80mg  daily previously. Will address at next visit.  Written patient instructions provided.  Total time in face to face counseling *** minutes.   Follow up Pharmacist/PCP*** Clinic Visit in ***.     , PharmD, BCPS PGY2 Ambulatory Care Resident [facility]  Pharmacy

## 2020-10-17 ENCOUNTER — Ambulatory Visit: Payer: Self-pay | Admitting: Pharmacist

## 2020-10-17 ENCOUNTER — Telehealth (INDEPENDENT_AMBULATORY_CARE_PROVIDER_SITE_OTHER): Payer: Self-pay | Admitting: Primary Care

## 2020-10-17 NOTE — Telephone Encounter (Signed)
Called to reschedule appt.  Patient is unable to come to appt. Today.  Please call to reschedule at [phone]

## 2020-10-25 IMAGING — CR DG CHEST 1V PORT
1 series · 1 of 1 positions shown · non-contrast
Comparison: Chest x-ray 07/29/2018

CLINICAL DATA: Postop bypass surgery.

EXAM:
PORTABLE CHEST 1 VIEW

[AP]
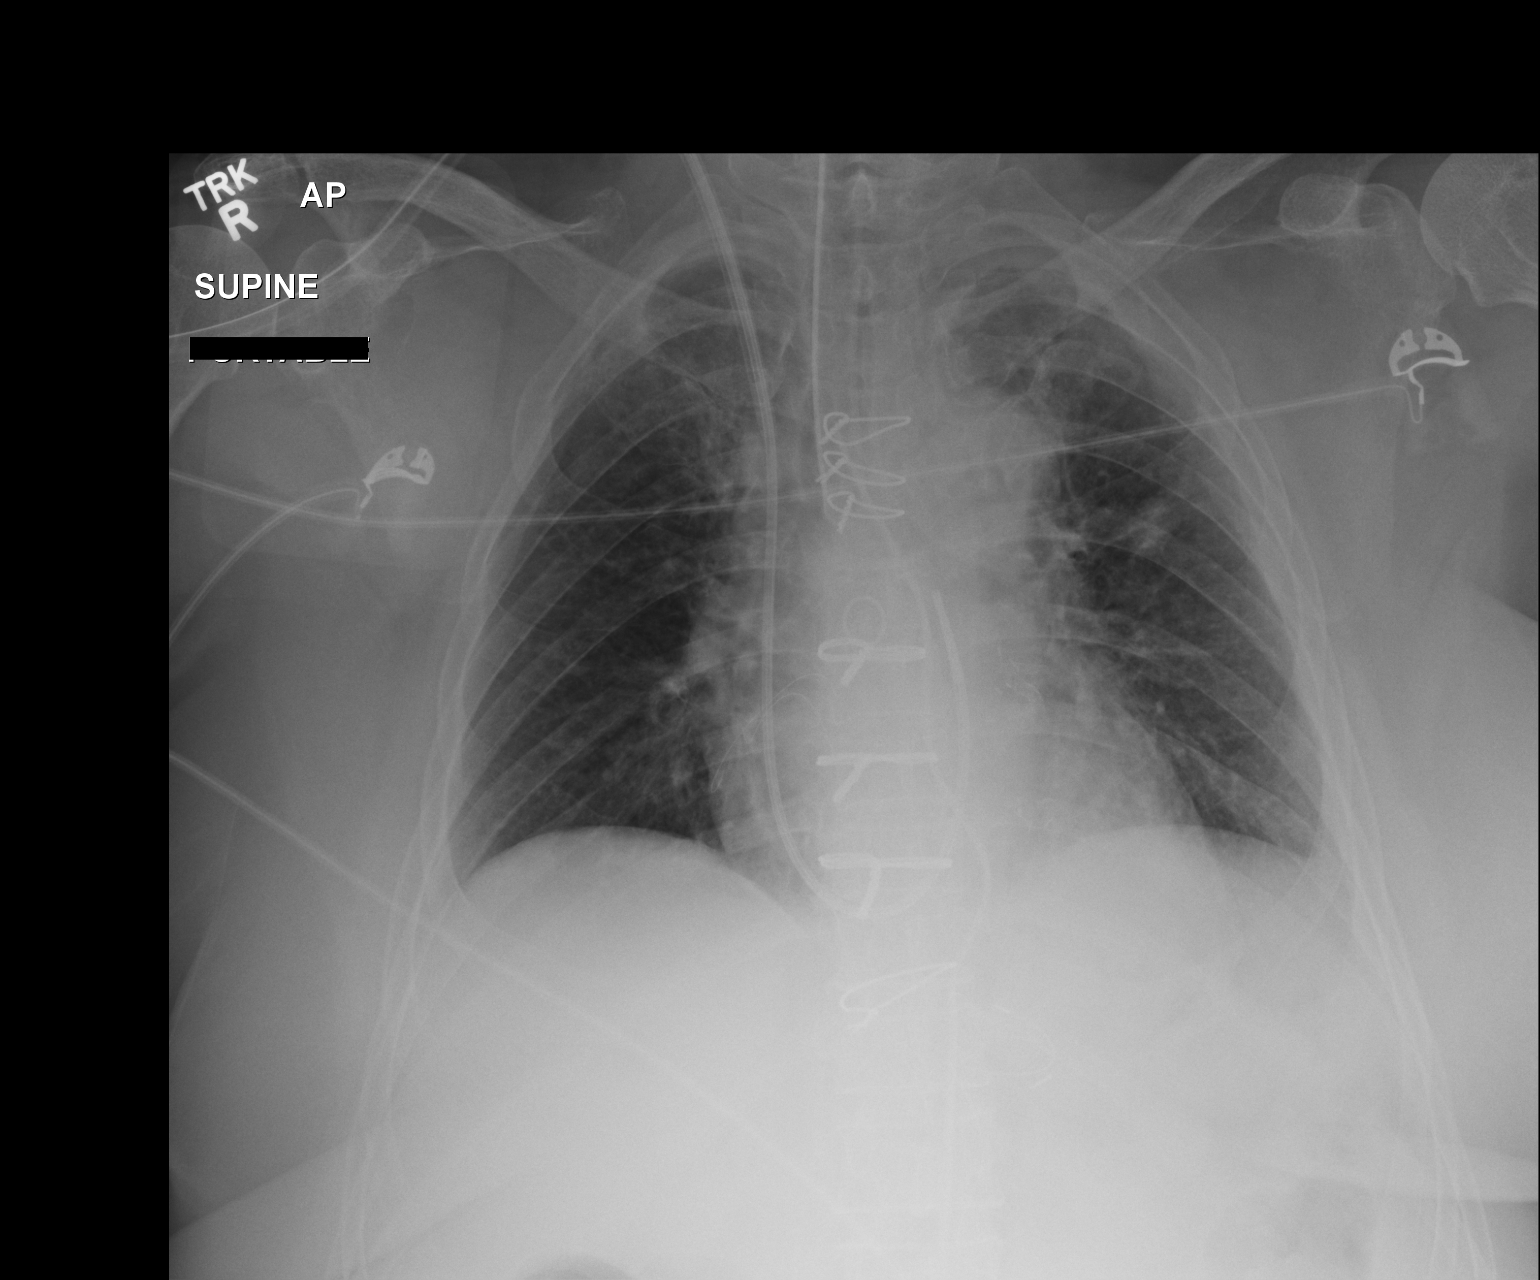

[1 of 1 positions shown; findings below may reference images not displayed]

FINDINGS: The endotracheal tube is right at the carina and could be retracted
2-3 cm.

The right IJ Swan-Ganz catheter tip appears to be in the main
pulmonary artery. A mediastinal drain tube is in place. No chest
tubes.

Minimal streaky areas of atelectasis but no infiltrates or pulmonary
edema. Probable small left pleural effusion.
IMPRESSION: 1. The endotracheal tube is right at the carina and could be
retracted 2-3 cm.
2. The other support apparatus appears in good position.
3. Minimal postoperative atelectasis and probable small left
effusion. No edema or pneumothorax.

## 2020-11-03 IMAGING — CR DG CHEST 2V
2 series · 2 of 2 positions shown · non-contrast
Comparison: 08/05/2018 and 08/17/2015.

CLINICAL DATA: CABG surgery on 08/02/2018. Some pain. History of
hypertension.

EXAM:
CHEST - 2 VIEW

[w chest pa]
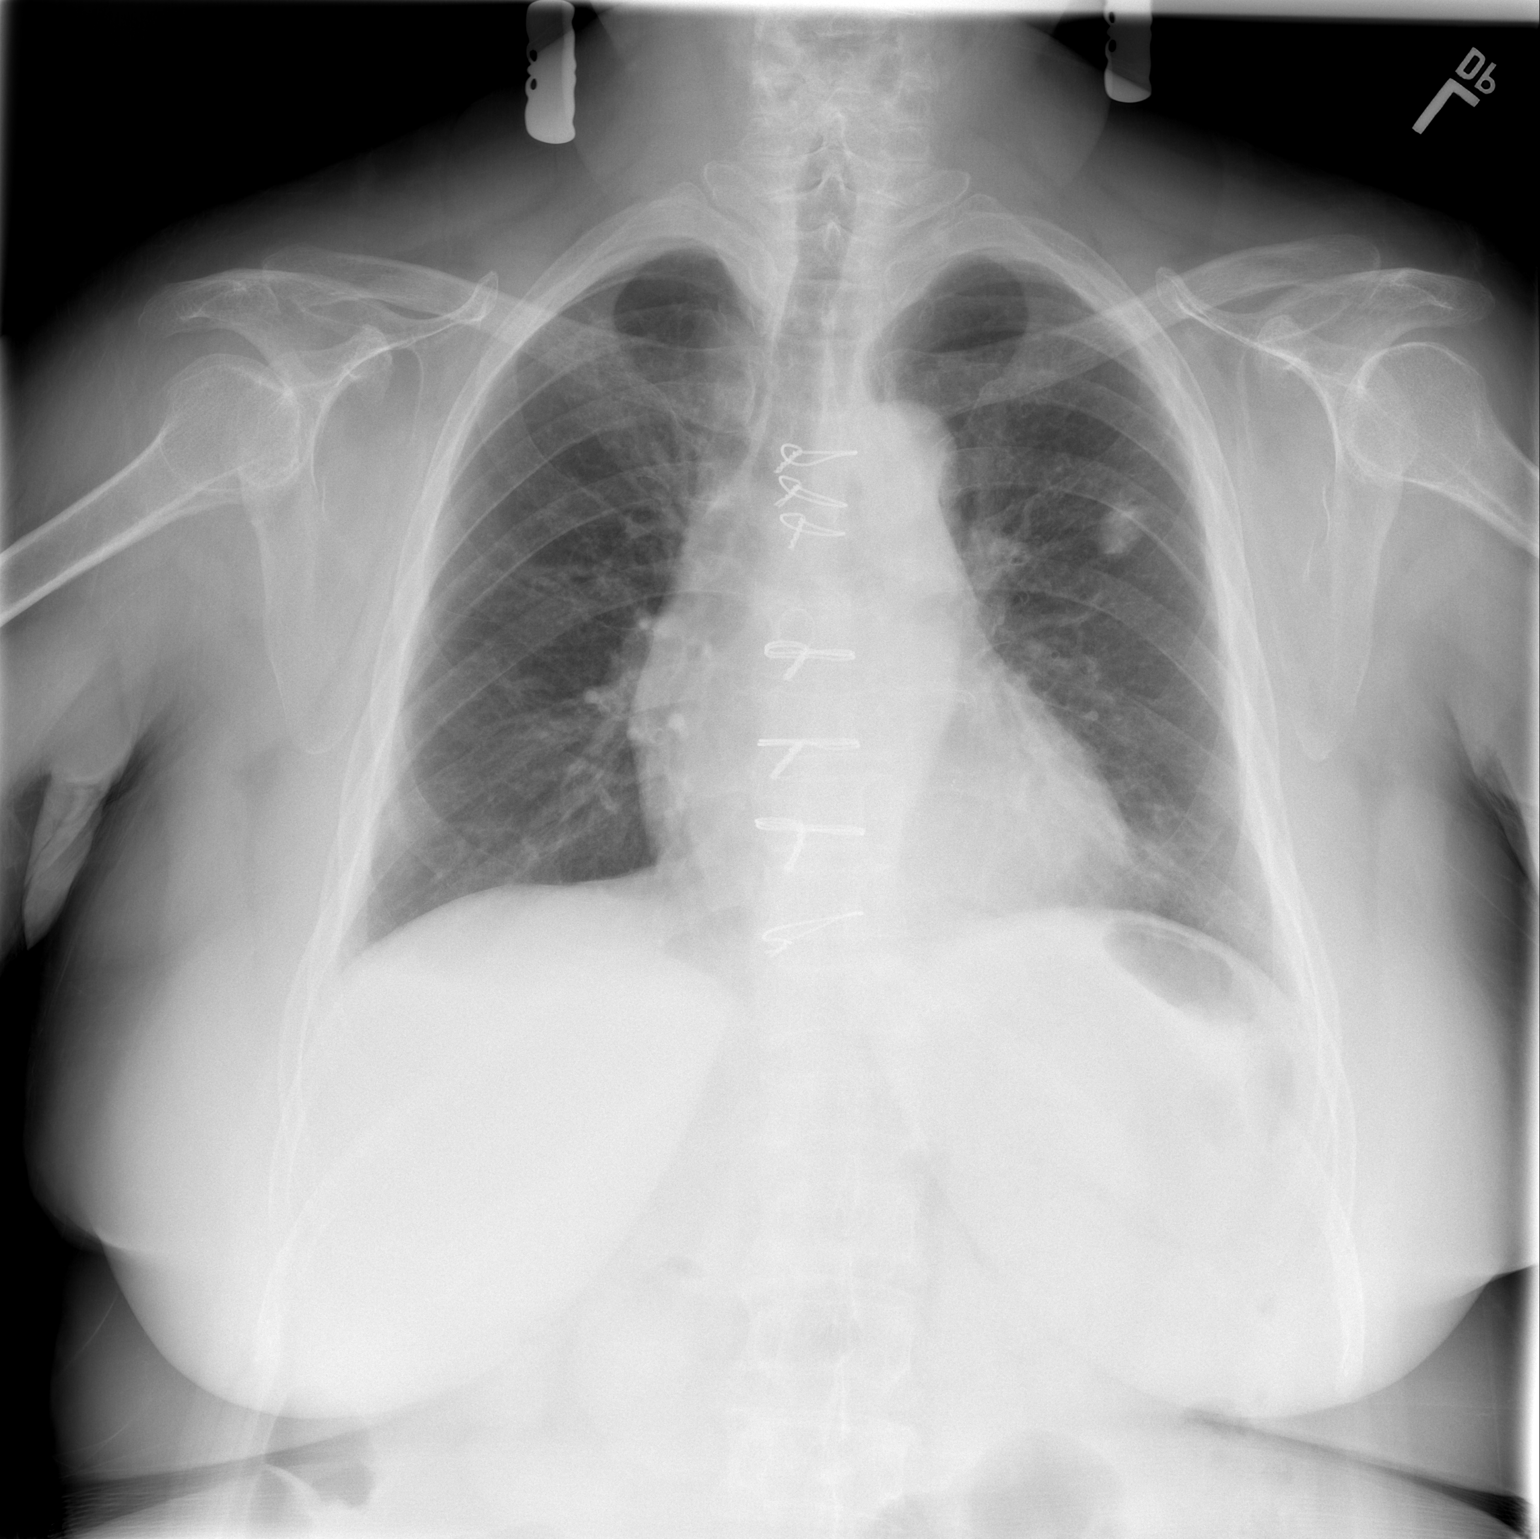

[w chest lat]
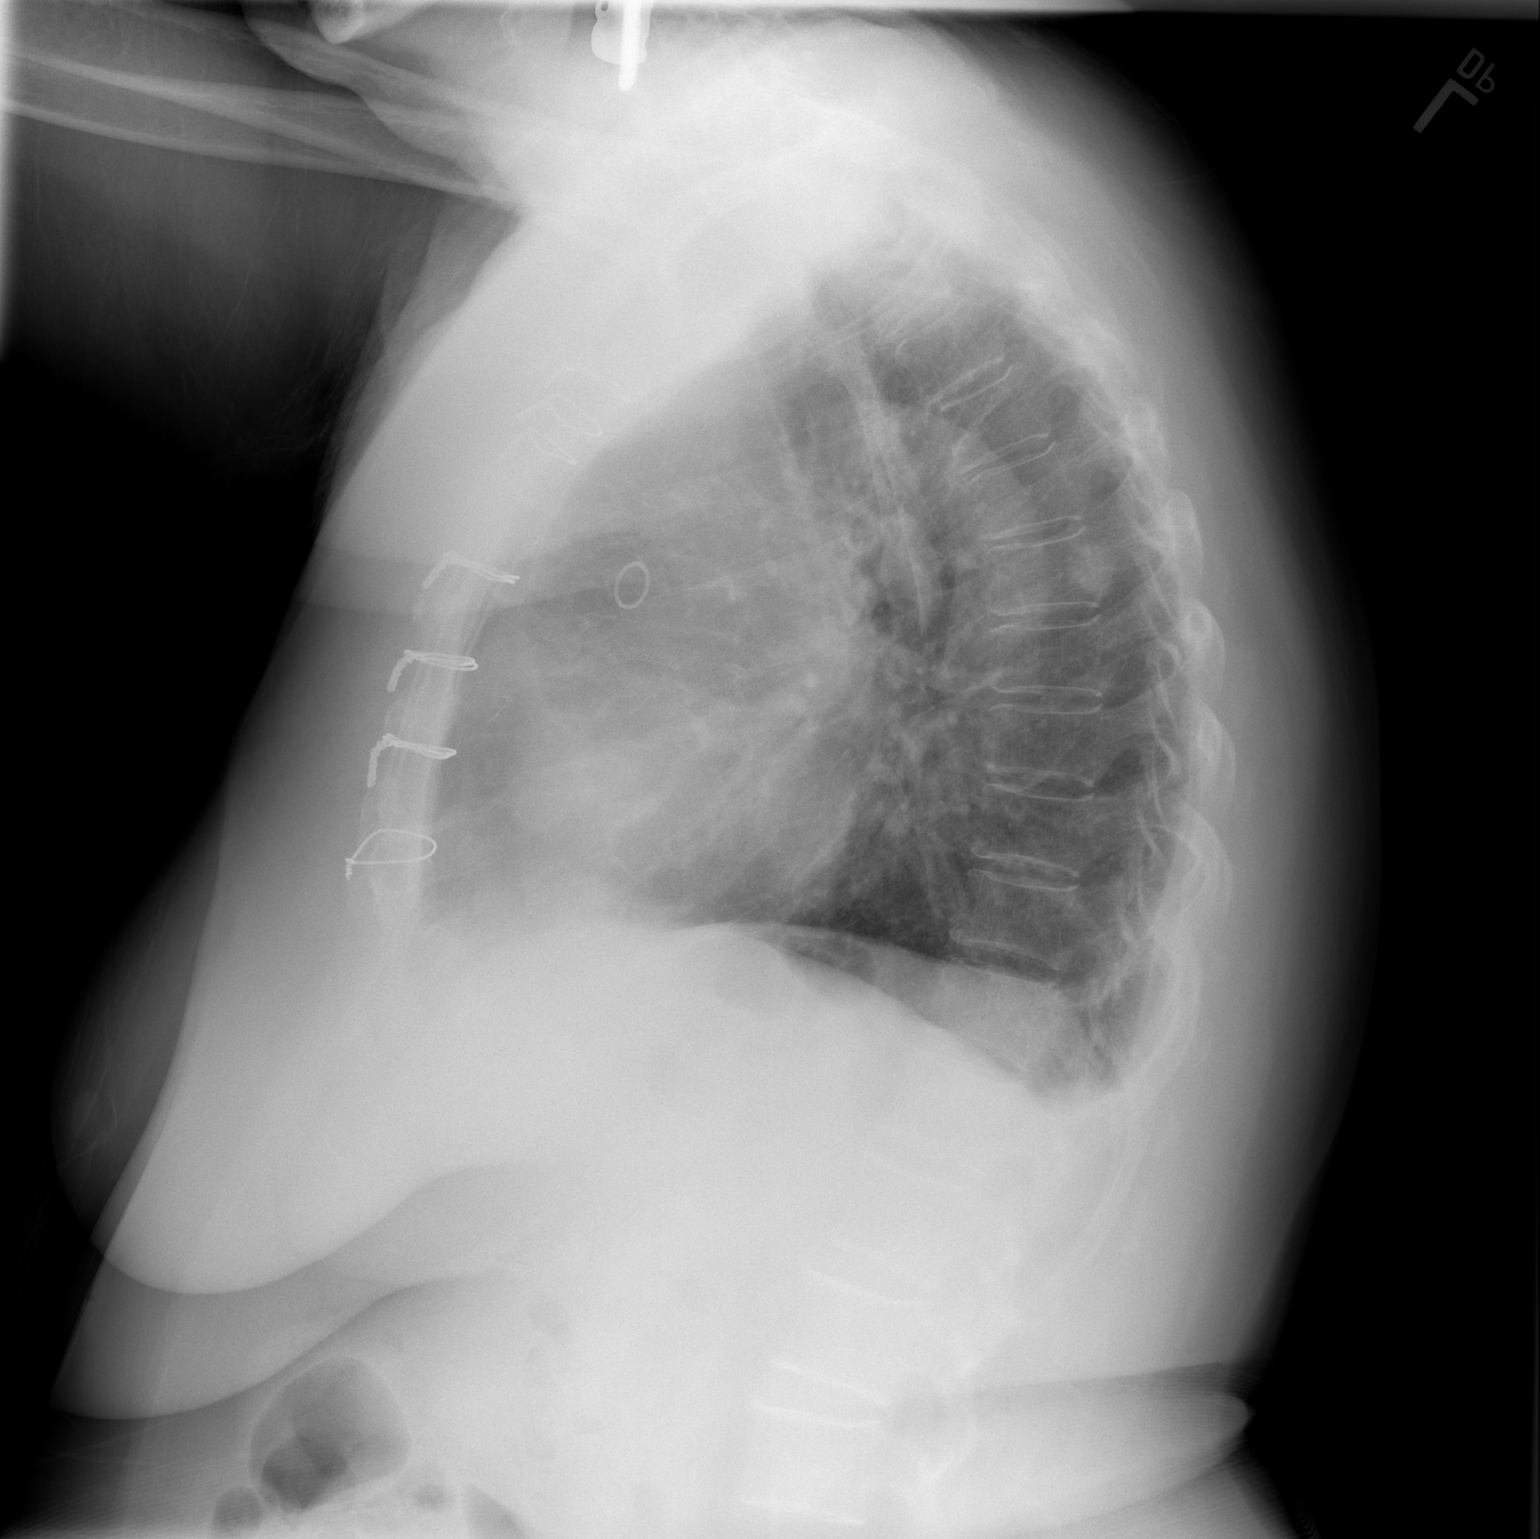

[2 of 2 positions shown; findings below may reference images not displayed]

FINDINGS: Sternal wires vascular remains reflecting recent cardiac surgery.
The cardiac silhouette is normal in size. There are left coronary
artery stents. No mediastinal widening. No mediastinal or hilar
masses or evidence of adenopathy.

Stable prominent granuloma in the left lower lobe superior segment.
Lungs otherwise clear. No pleural effusion or pneumothorax.

Skeletal structures are intact.
IMPRESSION: 1. No acute cardiopulmonary disease. No evidence of an operative
complication.

## 2020-11-14 ENCOUNTER — Encounter (INDEPENDENT_AMBULATORY_CARE_PROVIDER_SITE_OTHER): Payer: Self-pay | Admitting: Primary Care

## 2020-11-14 ENCOUNTER — Ambulatory Visit (INDEPENDENT_AMBULATORY_CARE_PROVIDER_SITE_OTHER): Payer: Self-pay | Admitting: Primary Care

## 2020-11-14 ENCOUNTER — Other Ambulatory Visit: Payer: Self-pay

## 2020-11-14 VITALS — BP 114/73 | HR 79 | Temp 97.3°F | Ht 63.0 in | Wt 215.8 lb

## 2020-11-14 DIAGNOSIS — Z794 Long term (current) use of insulin: Secondary | ICD-10-CM

## 2020-11-14 DIAGNOSIS — E119 Type 2 diabetes mellitus without complications: Secondary | ICD-10-CM

## 2020-11-14 LAB — GLUCOSE, POCT (MANUAL RESULT ENTRY): POC Glucose: 239 mg/dl — AB (ref 70–99)

## 2020-11-14 MED ORDER — LANTUS SOLOSTAR 100 UNIT/ML ~~LOC~~ SOPN: 6.0000 [IU] | PEN_INJECTOR | Freq: Every day | SUBCUTANEOUS | 0 refills | Status: DC

## 2020-11-14 NOTE — Progress Notes (Signed)
Erika Bates is a 70 y.o. female who presents for an follow up  evaluation of Type 2 diabetes mellitus.  Current symptoms/problems include none and have been stable.  Current diabetic medications include .  Monotherapy oral Metformin at 1000 mg twice a day,insulin  Lantus 6 units daily and Victoza 18 units daily The patient was initially diagnosed with Type 2 diabetes mellitus based on the following criteria: ADA guidelines A1c is 8.4 Current monitoring regimen: none Home blood sugar records: None Any episodes of hypoglycemia? no  Known diabetic complications: none Cardiovascular risk factors: advanced age (older than 87 for men, 10 for women), diabetes mellitus, hypertension and obesity (BMI >= 30 kg/m2) Eye exam current (within one year): unknown Weight trend: stable Prior visit with CDE: no Current diet: in general, a "healthy" diet   Current exercise: housecleaning Medication Compliance?  Yes   Is She on ACE inhibitorYes  lisinopril (generic)   The following portions of the patient's history were reviewed and updated as appropriate: allergies, past medical history, past social history and past surgical history.  ROS  Objective:  All pertinent positive and negative noted in HPI Vitals:   11/14/20 1108  BP: 114/73  Pulse: 79  Temp: (!) 97.3 F (36.3 C)  TempSrc: Temporal  SpO2: 97%  Weight: 215 lb 12.8 oz (97.9 kg)  Height: 5\' 3"  (1.6 m)    BMI 38.23 kg/m   Physical Exam  General: Vital signs reviewed.  Patient is well-developed and well-nourished obese female , in no acute distress and cooperative with exam.  Head: Normocephalic and atraumatic. Eyes: EOMI, conjunctivae normal, no scleral icterus.  Neck: Supple, trachea midline, normal ROM, no JVD, masses, thyromegaly, or carotid bruit present.  Cardiovascular: RRR, S1 normal, S2 normal, no murmurs, gallops, or rubs. Pulmonary/Chest: Clear to auscultation bilaterally, no wheezes, rales, or  rhonchi. Abdominal: Soft, non-tender, non-distended, BS +, no masses, organomegaly, or guarding present.  Musculoskeletal: No joint deformities, erythema, or stiffness, ROM full and nontender. Extremities: No lower extremity edema bilaterally,  pulses symmetric and intact bilaterally. No cyanosis or clubbing. Neurological: A&O x3, Strength is normal and symmetric bilaterally no focal motor deficit, Skin: Warm, dry and intact. No rashes or erythema. Psychiatric: Normal mood and affect. speech and behavior is normal. Cognition and memory are normal.  Lab Review Glucose (mg/dL)  Date Value  151 (H)  08/11/2019 127 (H)  05/03/2019 163 (H)   Glucose, Bld (mg/dL)  Date Value  07/03/2019 86  08/06/2018 79  08/05/2018 114 (H)   CO2 (mmol/L)  Date Value  08/15/2020 22  08/11/2019 23  05/03/2019 23   BUN (mg/dL)  Date Value  07/03/2019 16  08/11/2019 16  05/03/2019 16   Creatinine, Ser (mg/dL)  Date Value  07/03/2019 1.38 (H)  08/11/2019 1.50 (H)  05/03/2019 1.41 (H)      Assessment:    Diabetes Mellitus type II, under excellent control.   Erika Bates was seen today for diabetes.  Diagnoses and all orders for this visit:   Type 2 diabetes mellitus without complication, with long-term current use of insulin (HCC) -     insulin glargine (LANTUS SOLOSTAR) 100 UNIT/ML Solostar Pen; Inject 6 Units into the skin daily. Blood sugar is not yet therapeutic ~less than 6.7 discussed comorbidities with uncontrolled diabetes Discussed  co- morbidities with uncontrol diabetes  Complications -diabetic retinopathy, (close your eyes ? What do you see nothing) nephropathy decrease in kidney function- can lead to dialysis-on a machine 3 days a  week to filter your kidney, neuropathy- numbness and tinging in your hands and feet,  increase risk of heart attack and stroke, and amputation due to decrease wound healing and circulation. Decrease your risk by taking medication daily as  prescribed, monitor carbohydrates- foods that are high in carbohydrates are the following rice, potatoes, breads, sugars, and pastas.  Reduction in the intake (eating) will assist in lowering your blood sugars. Exercise daily at least 30 minutes daily.     Plan:    1.  Rx changes: none 2.  Education: Reviewed 'ABCs' of diabetes management (respective goals in parentheses):  A1C (<7), blood pressure (<130/80), and cholesterol (LDL <100). 3. Discussed pathophysiology of DM; difference between type 1 and type 2 DM. 4. CHO counting diet discussed.  Reviewed CHO amount in various foods and how to read nutrition labels.  Discussed recommended serving sizes.  5.  Recommend check BG 3 times a day 6.  Recommended increase physical activity - goal is 150 minutes per week 7. Follow up: 3 months   Erika Bates was seen today for diabetes.

## 2020-11-14 NOTE — Patient Instructions (Signed)
Diabetes mellitus y nutricin, en adultos Diabetes Mellitus and Nutrition, Adult Si sufre de diabetes, o diabetes mellitus, es muy importante tener hbitos alimenticios saludables debido a que sus niveles de azcar en la sangre (glucosa) se ven afectados en gran medida por lo que come y bebe. Comer alimentos saludables en las cantidades correctas, aproximadamente a la misma hora todos los das, lo ayudar a: Controlar la glucemia. Disminuir el riesgo de sufrir una enfermedad cardaca. Mejorar la presin arterial. Alcanzar o mantener un peso saludable. Qu puede afectar mi plan de alimentacin? Todas las personas que sufren de diabetes son diferentes y cada una tiene necesidades diferentes en cuanto a un plan de alimentacin. El mdico puede recomendarle que trabaje con un nutricionista para elaborar el mejor plan para usted. Su plan de alimentacin puede variar segn factores como: Las caloras que necesita. Los medicamentos que toma. Su peso. Sus niveles de glucemia, presin arterial y colesterol. Su nivel de actividad. Otras afecciones que tenga, como enfermedades cardacas o renales. Cmo me afectan los carbohidratos? Los carbohidratos, o hidratos de carbono, afectan su nivel de glucemia ms que cualquier otro tipo de alimento. La ingesta de carbohidratos naturalmente aumenta la cantidad de glucosa en la sangre. El recuento de carbohidratos es un mtodo destinado a llevar un registro de la cantidad de carbohidratos que se consumen. El recuento de carbohidratos es importante para mantener la glucemia a un nivel saludable, especialmente si utiliza insulina o toma determinados medicamentos por va oral para la diabetes. Es importante conocer la cantidad de carbohidratos que se pueden ingerir en cada comida sin correr ningn riesgo. Esto es diferente en cada persona. Su nutricionista puede ayudarlo a calcular la cantidad de carbohidratos que debe ingerir en cada comida y en cada refrigerio. Cmo  me afecta el alcohol? El alcohol puede provocar disminuciones sbitas de la glucemia (hipoglucemia), especialmente si utiliza insulina o toma determinados medicamentos por va oral para la diabetes. La hipoglucemia es una afeccin potencialmente mortal. Los sntomas de la hipoglucemia, como somnolencia, mareos y confusin, son similares a los sntomas de haber consumido demasiado alcohol. No beba alcohol si: Su mdico le indica no hacerlo. Est embarazada, puede estar embarazada o est tratando de quedar embarazada. Si bebe alcohol: No beba con el estmago vaco. Limite la cantidad que bebe: De 0 a 1 medida por da para las mujeres. De 0 a 2 medidas por da para los hombres. Est atento a la cantidad de alcohol que hay en las bebidas que toma. En los [place], una medida equivale a una botella de cerveza de 12 oz (355 ml), un vaso de vino de 5 oz (148 ml) o un vaso de una bebida alcohlica de alta graduacin de 1 oz (44 ml). Mantngase hidratado bebiendo agua, refrescos dietticos o t helado sin azcar. Tenga en cuenta que los refrescos comunes, los jugos y otras bebida para mezclar pueden contener mucha azcar y se deben contar como carbohidratos. Consejos para seguir este plan Leer las etiquetas de los alimentos Comience por leer el tamao de la porcin en la "Informacin nutricional" en las etiquetas de los alimentos envasados y las bebidas. La cantidad de caloras, carbohidratos, grasas y otros nutrientes mencionados en la etiqueta se basan en una porcin del alimento. Muchos alimentos contienen ms de una porcin por envase. Verifique la cantidad total de gramos (g) de carbohidratos totales en una porcin. Puede calcular la cantidad de porciones de carbohidratos al dividir el total de carbohidratos por 15. Por ejemplo, si un alimento tiene un   total de 30 g de carbohidratos totales por porcin, equivale a 2 porciones de carbohidratos. Verifique la cantidad de gramos (g) de grasas  saturadas y grasas trans de una porcin. Escoja alimentos que no contengan estas grasas o que su contenido de estas sea bajo. Verifique la cantidad de miligramos (mg) de sal (sodio) en una porcin. La mayora de las personas deben limitar la ingesta de sodio total a menos de 2300 mg por da. Siempre consulte la informacin nutricional de los alimentos etiquetados como "con bajo contenido de grasa" o "sin grasa". Estos alimentos pueden tener un mayor contenido de azcar agregada o carbohidratos refinados, y deben evitarse. Hable con su nutricionista para identificar sus objetivos diarios en cuanto a los nutrientes mencionados en la etiqueta. Al ir de compras Evite comprar alimentos procesados, enlatados o precocidos. Estos alimentos tienden a tener una mayor cantidad de grasa, sodio y azcar agregada. Compre en la zona exterior de la tienda de comestibles. Esta es la zona donde se encuentran con mayor frecuencia las frutas y las verduras frescas, los cereales a granel, las carnes frescas y los productos lcteos frescos. Al cocinar Utilice mtodos de coccin a baja temperatura, como hornear, en lugar de mtodos de coccin a alta temperatura, como frer en abundante aceite. Cocine con aceites saludables, como el aceite de oliva, canola o girasol. Evite cocinar con manteca, crema o carnes con alto contenido de grasa. Planificacin de las comidas Coma las comidas y los refrigerios regularmente, preferentemente a la misma hora todos los das. Evite pasar largos perodos de tiempo sin comer. Consuma alimentos ricos en fibra, como frutas frescas, verduras, frijoles y cereales integrales. Consulte a su nutricionista sobre cuntas porciones de carbohidratos puede consumir en cada comida. Consuma entre 4 y 6 onzas (entre 112 y 168 g) de protenas magras por da, como carnes magras, pollo, pescado, huevos o tofu. Una onza (oz) de protena magra equivale a: 1 onza (28 g) de carne, pollo o pescado. 1 huevo.  de  taza (62 g) de tofu. Coma algunos alimentos por da que contengan grasas saludables, como aguacates, frutos secos, semillas y pescado. Qu alimentos debo comer? Frutas Bayas. Manzanas. Naranjas. Duraznos. Damascos. Ciruelas. Uvas. Mango. Papaya. Granada. Kiwi. Cerezas. Verduras Lechuga. Espinaca. Verduras de hoja verde, que incluyen col rizada, acelga, hojas de berza y de mostaza. Remolachas. Coliflor. Repollo. Brcoli. Zanahorias. Judas verdes. Tomates. Pimientos. Cebollas. Pepinos. Coles de Bruselas. Granos Granos integrales, como panes, galletas, tortillas, cereales y pastas de salvado o integrales. Avena sin azcar. Quinua. Arroz integral o salvaje. Carnes y otras protenas Mariscos. Carne de ave sin piel. Cortes magros de ave y carne de res. Tofu. Frutos secos. Semillas. Lcteos Productos lcteos sin grasa o con bajo contenido de grasa, como leche, yogur y queso. Es posible que los productos que se enumeran ms arriba no constituyan una lista completa de los alimentos y las bebidas que puede tomar. Consulte a un nutricionista para obtener ms informacin. Qu alimentos debo evitar? Frutas Frutas enlatadas al almbar. Verduras Verduras enlatadas. Verduras congeladas con mantequilla o salsa de crema. Granos Productos elaborados con harina y harina blanca refinada, como panes, pastas, bocadillos y cereales. Evite todos los alimentos procesados. Carnes y otras protenas Cortes de carne con alto contenido de grasa. Carne de ave con piel. Carnes empanizadas o fritas. Carne procesada. Evite las grasas saturadas. Lcteos Yogur, queso o leche enteros. Bebidas Bebidas azucaradas, como gaseosas o t helado. Es posible que los productos que se enumeran ms arriba no constituyan una lista completa de   los alimentos y las bebidas que debe evitar. Consulte a un nutricionista para obtener ms informacin. Preguntas para hacerle al mdico Es necesario que me rena con un instructor en el cuidado  de la diabetes? Es necesario que me rena con un nutricionista? A qu nmero puedo llamar si tengo preguntas? Cules son los mejores momentos para controlar la glucemia? Dnde encontrar ms informacin: Asociacin Estadounidense de la Diabetes (American Diabetes Association): diabetes.org Academy of Nutrition and Dietetics (Academia de Nutricin y Diettica): www.eatright.org National Institute of Diabetes and Digestive and Kidney Diseases (Instituto Nacional de la Diabetes y las Enfermedades Digestivas y Renales): www.niddk.nih.gov Association of Diabetes Care and Education Specialists (Asociacin de Especialistas en Atencin y Educacin sobre la Diabetes): www.diabeteseducator.org Resumen Es importante tener hbitos alimenticios saludables debido a que sus niveles de azcar en la sangre (glucosa) se ven afectados en gran medida por lo que come y bebe. Un plan de alimentacin saludable lo ayudar a controlar la glucemia y mantener un estilo de vida saludable. El mdico puede recomendarle que trabaje con un nutricionista para elaborar el mejor plan para usted. Tenga en cuenta que los carbohidratos (hidratos de carbono) y el alcohol tienen efectos inmediatos en sus niveles de glucemia. Es importante contar los carbohidratos que ingiere y consumir alcohol con prudencia. Esta informacin no tiene como fin reemplazar el consejo del mdico. Asegrese de hacerle al mdico cualquier pregunta que tenga. Document Revised: 10/20/2019 Document Reviewed: 10/20/2019 Elsevier Patient Education  2021 Elsevier Inc.  

## 2020-11-27 NOTE — Progress Notes (Unsigned)
    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 Presents for diabetes evaluation, education, and management. Patient was referred and last seen by Primary Care Provider on 11/14/20. Pharmacy saw her on 10/03/20 and BG 332 and home FBG [id number]. Pt was started on Victoza 0.6 mg daily and Lantus decreased to 6 units daily. Most recent visit with PCP on 2/16, there were no medication changes to DM regimen.  Today, patient reports ***  A1C = 8.4% (down from 10.8% in November) Check Clinic BG Review medications and adherence (timing of meds, etc.)  Ate or drank anything prior to visit today? At home BGs?  Marland Kitchen Highs . Lows  Hyperglycemia sx (nocturia, neuropathy, visual changes, foot exams) Hypoglycemia symptoms (dizziness, shaky, sweating, hungry, confusion) Diet Exercise  Plan 1. Titrate insulin 2. Tolerating Victoza? 3. rousvastatin   Family/Social History:  -Fhx: father with hypertension -Tobacco use: denies -Alcohol use: denies  Insurance coverage/medication affordability: dnbi  Medication adherence reported *** .   Current diabetes medications include: Victoza 1.8 mg daily, Lantus 6 units daily, metformin 1000 mg BID Current hypertension medications include: lisinopril 2.5 mg (renal protection) Current hyperlipidemia medications include:pravastatin 40 mg daily  Patient {Actions; denies-reports:120008} hypoglycemic events.  Patient reported dietary habits: Eats 2-3 meals/day Breakfast: bread, coffee,  Lunch/dinner: fish, rice, sweet potato, some vegetables Snacks: saltine crackers, little cookies  Drinks: soda and water   Patient-reported exercise habits: walks outside around the house in the mornings and walks to friends houses too   Patient {Actions; denies-reports:120008} nocturia (nighttime urination).  Patient  {Actions; denies-reports:120008} neuropathy (nerve pain). Patient {Actions; denies-reports:120008} visual changes. Patient {Actions; denies-reports:120008} self foot exams.     O:  POCT:  Home fasting blood sugars: ***  2 hour post-meal/random blood sugars: ***.  Lab Results  Component Value Date   HGBA1C 8.4 (A) 10/03/2020   There were no vitals filed for this visit.  Lipid Panel     Component Value Date/Time   CHOL 179 08/15/2020 0900   TRIG 183 (H) 08/15/2020 0900   HDL 37 (L) 08/15/2020 0900   CHOLHDL 4.8 (H) 08/15/2020 0900   CHOLHDL 3.5 07/30/2018 0335   VLDL 27 07/30/2018 0335   LDLCALC 110 (H) 08/15/2020 0900   Clinical Atherosclerotic Cardiovascular Disease (ASCVD): Yes  The ASCVD Risk score Denman George DC Jr., et al., 2013) failed to calculate for the following reasons:   The patient has a prior MI or stroke diagnosis    A/P: Diabetes longstanding*** currently ***. Patient is *** able to verbalize appropriate hypoglycemia management plan. Medication adherence appears ***. Control is suboptimal due to ***. - -Extensively discussed pathophysiology of diabetes, recommended lifestyle interventions, dietary effects on blood sugar control -Counseled on s/sx of and management of hypoglycemia -Next A1C anticipated ***.    ASCVD risk - secondary prevention in patient with diabetes. Last LDL is not controlled. high intensity statin indicated. Aspirin is indicated. Pt is on pravastatin but maintained a goal LDL < 70 on atorvastatin 80mg  daily previously. Will address at next visit.  Written patient instructions provided.  Total time in face to face counseling *** minutes.   Follow up Pharmacist/PCP*** Clinic Visit in ***.   Patient seen with ***  , PharmD, BCPS PGY2 Ambulatory Care Resident [facility]arm[facility]

## 2020-11-28 ENCOUNTER — Ambulatory Visit: Payer: Self-pay | Admitting: Pharmacist

## 2020-12-25 ENCOUNTER — Ambulatory Visit: Payer: Self-pay | Admitting: Pharmacist

## 2021-01-08 ENCOUNTER — Ambulatory Visit: Payer: Self-pay | Admitting: Pharmacist

## 2021-01-08 ENCOUNTER — Other Ambulatory Visit: Payer: Self-pay

## 2021-01-08 ENCOUNTER — Ambulatory Visit: Payer: Self-pay | Attending: Primary Care | Admitting: Pharmacist

## 2021-01-08 DIAGNOSIS — Z794 Long term (current) use of insulin: Secondary | ICD-10-CM

## 2021-01-08 DIAGNOSIS — E119 Type 2 diabetes mellitus without complications: Secondary | ICD-10-CM

## 2021-01-08 MED ORDER — LANTUS SOLOSTAR 100 UNIT/ML ~~LOC~~ SOPN
7.0000 [IU] | PEN_INJECTOR | Freq: Every day | SUBCUTANEOUS | 0 refills | Status: DC
  Filled 2021-01-08: qty 3, 28d supply, fill #0

## 2021-01-08 MED ORDER — TRUE METRIX METER W/DEVICE KIT
PACK | 0 refills | Status: DC
  Filled 2021-01-08: qty 1, 1d supply, fill #0

## 2021-01-08 MED ORDER — TRUEPLUS LANCETS 28G MISC
2 refills | Status: DC
  Filled 2021-01-08: qty 100, 33d supply, fill #0

## 2021-01-08 MED ORDER — TRUE METRIX BLOOD GLUCOSE TEST VI STRP
ORAL_STRIP | 2 refills | Status: DC
  Filled 2021-01-08: qty 100, 33d supply, fill #0

## 2021-01-08 MED ORDER — LIRAGLUTIDE 18 MG/3ML ~~LOC~~ SOPN
PEN_INJECTOR | SUBCUTANEOUS | 2 refills | Status: DC
  Filled 2021-01-08: qty 6, 27d supply, fill #0

## 2021-01-08 MED ORDER — ATORVASTATIN CALCIUM 40 MG PO TABS
40.0000 mg | ORAL_TABLET | Freq: Every day | ORAL | 2 refills | Status: DC
  Filled 2021-01-08: qty 30, 30d supply, fill #0

## 2021-01-08 NOTE — Progress Notes (Signed)
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 Presents for diabetes evaluation, education, and management. Patient was referred and last seen by Primary Care Provider on 11/14/2020. Today, pt tells me she is out of all of her medications. She has not had DM medications for about 2 weeks.   Family/Social History:  -Fhx: father with hypertension -Tobacco use: denies -Alcohol use: denies  Insurance coverage/medication affordability: dnbi  Medication adherence denied. She has not taken any antidiabetic medications in 2 weeks.  Current diabetes medications include: Lantus 7 units daily, metformin 1000 mg BID, Victoza 1.8 mg daily Current hypertension medications include: lisinopril 2.5 mg  Current hyperlipidemia medications include: pravastatin 40 mg daily  Patient denies hypoglycemic events.  Patient reported dietary habits: Eats 2-3 meals/day Breakfast: bread, coffee,  Lunch/dinner: fish, rice, sweet potato, some vegetables Snacks: saltine crackers, little cookies  Drinks: soda and water   Patient-reported exercise habits: walks outside around the house in the mornings and walks to friends houses too   Patient reports nocturia (nighttime urination). (3x/night) Patient reports neuropathy (nerve pain). Patient reports visual changes. Patient reports self foot exams.    O:  POCT: 303  Home fasting blood sugars: none - not checking. Requests new DM testing supplies.   Lab Results  Component Value Date   HGBA1C 8.4 (A) 10/03/2020   There were no vitals filed for this visit.  Lipid Panel     Component Value Date/Time   CHOL 179 08/15/2020 0900   TRIG 183 (H) 08/15/2020 0900   HDL 37 (L) 08/15/2020 0900   CHOLHDL 4.8 (H) 08/15/2020 0900   CHOLHDL 3.5 07/30/2018 0335   VLDL 27 07/30/2018 0335   LDLCALC 110 (H)  08/15/2020 0900    Clinical Atherosclerotic Cardiovascular Disease (ASCVD): Yes  The ASCVD Risk score Denman George DC Jr., et al., 2013) failed to calculate for the following reasons:   The patient has a prior MI or stroke diagnosis   A/P: Diabetes longstanding currently uncontrolled. Medication adherence denied. We will restart Lantus and Victoza. I have instructed her to hold metformin for now until we get her BMP results. New testing supplies rx sent.  -Restart Victoza 0.6 mg daily x1 week, then 1.2 mg daily x1 week, then increase to 1.8 mg daily thereafter.  -Restart Lantus 7u daily.  -Hold metformin for now.  -Extensively discussed pathophysiology of diabetes, recommended lifestyle interventions, dietary effects on blood sugar control -Counseled on s/sx of and management of hypoglycemia -Next A1C anticipated 12/2020. Will get at next visit.  ADDENDUM 01/09/2021: Pt's renal function has declined. Scr is 1.53 with a GFR of 37. Since she has been without metformin, I recommend to hold off on restarting at this time. Will wait for interpretation/review from PCP before reaching out to the patient. In the meantime, we instructed her yesterday to hold metformin until we speak to her so she should not be taking this.   ASCVD risk - secondary prevention in patient with diabetes. Last LDL is not controlled. high intensity statin indicated. Aspirin is indicated. Will stop pravastatin and start atorvastatin. -Stop pravastatin.  -Start atorvastatin 40 mg daily.   Written patient instructions provided.  Total time in face to face counseling 30 minutes.   Follow up Pharmacist Clinic Visit in 2 weeks.   Butch Penny, PharmD, BCACP, CPP Clinical Pharmacist  [place] [phone]

## 2021-01-09 ENCOUNTER — Other Ambulatory Visit: Payer: Self-pay

## 2021-01-09 ENCOUNTER — Other Ambulatory Visit (INDEPENDENT_AMBULATORY_CARE_PROVIDER_SITE_OTHER): Payer: Self-pay

## 2021-01-09 ENCOUNTER — Encounter: Payer: Self-pay | Admitting: Pharmacist

## 2021-01-09 LAB — CMP14+EGFR
ALT: 9 IU/L (ref 0–32)
AST: 14 IU/L (ref 0–40)
Albumin/Globulin Ratio: 1 — ABNORMAL LOW (ref 1.2–2.2)
Albumin: 4 g/dL (ref 3.8–4.8)
Alkaline Phosphatase: 90 IU/L (ref 44–121)
BUN/Creatinine Ratio: 12 (ref 12–28)
BUN: 18 mg/dL (ref 8–27)
Bilirubin Total: 0.4 mg/dL (ref 0.0–1.2)
CO2: 23 mmol/L (ref 20–29)
Calcium: 9.2 mg/dL (ref 8.7–10.3)
Chloride: 100 mmol/L (ref 96–106)
Creatinine, Ser: 1.53 mg/dL — ABNORMAL HIGH (ref 0.57–1.00)
Globulin, Total: 4.1 g/dL (ref 1.5–4.5)
Glucose: 280 mg/dL — ABNORMAL HIGH (ref 65–99)
Potassium: 4.8 mmol/L (ref 3.5–5.2)
Sodium: 139 mmol/L (ref 134–144)
Total Protein: 8.1 g/dL (ref 6.0–8.5)
eGFR: 37 mL/min/{1.73_m2} — ABNORMAL LOW (ref >59)

## 2021-01-09 LAB — MICROALBUMIN / CREATININE URINE RATIO
Creatinine, Urine: 44.5 mg/dL
Microalb/Creat Ratio: 268 mg/g creat — ABNORMAL HIGH (ref 0–29)
Microalbumin, Urine: 119.2 ug/mL

## 2021-01-09 LAB — GLUCOSE, POCT (MANUAL RESULT ENTRY): POC Glucose: 303 mg/dl — AB (ref 70–99)

## 2021-01-09 MED ORDER — INSULIN PEN NEEDLE 32G X 4 MM MISC
2 refills | Status: AC
  Filled 2021-01-09: qty 100, 50d supply, fill #0

## 2021-01-09 NOTE — Telephone Encounter (Signed)
Copied from CRM [id number]. Topic: Quick Communication - Rx Refill/Question >> Jan 09, 2021 10:09 AM Louie Bun, Rosey Bath D wrote: Medication: Insulin Pen Needle 32G X 4 MM MISC  Has the patient contacted their pharmacy? Yes.   (Agent: If no, request that the patient contact the pharmacy for the refill.) (Agent: If yes, when and what did the pharmacy advise?)  Preferred Pharmacy (with phone number or street name): [facility]  Agent: Please be advised that RX refills may take up to 3 business days. We ask that you follow-up with your pharmacy.

## 2021-01-15 ENCOUNTER — Other Ambulatory Visit: Payer: Self-pay

## 2021-01-21 ENCOUNTER — Other Ambulatory Visit (INDEPENDENT_AMBULATORY_CARE_PROVIDER_SITE_OTHER): Payer: Self-pay | Admitting: Primary Care

## 2021-01-21 DIAGNOSIS — N183 Chronic kidney disease, stage 3 unspecified: Secondary | ICD-10-CM

## 2021-01-23 ENCOUNTER — Encounter: Payer: Self-pay | Admitting: Pharmacist

## 2021-01-23 ENCOUNTER — Other Ambulatory Visit: Payer: Self-pay

## 2021-01-23 ENCOUNTER — Ambulatory Visit: Payer: Self-pay | Attending: Family Medicine | Admitting: Pharmacist

## 2021-01-23 DIAGNOSIS — E119 Type 2 diabetes mellitus without complications: Secondary | ICD-10-CM

## 2021-01-23 DIAGNOSIS — Z794 Long term (current) use of insulin: Secondary | ICD-10-CM

## 2021-01-23 LAB — POCT GLYCOSYLATED HEMOGLOBIN (HGB A1C): Hemoglobin A1C: 9.9 % — AB (ref 4.0–5.6)

## 2021-01-23 MED ORDER — LANTUS SOLOSTAR 100 UNIT/ML ~~LOC~~ SOPN
5.0000 [IU] | PEN_INJECTOR | Freq: Every day | SUBCUTANEOUS | 0 refills | Status: DC
  Filled 2021-01-23: qty 15, 300d supply, fill #0

## 2021-01-23 NOTE — Progress Notes (Signed)
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 Presents for diabetes evaluation, education, and management. Patient was referred and last seen by Primary Care Provider on 11/14/2020. Last seen by pharmacy on 01/08/2021. At that visit, victoza and lantus were restarted since patient had been out of medication.  Today, pt reports only starting medication 1 week ago but has not missed any doses. She endorses feeling fatigued when her sugar is around 140. Pt denies any hyperglycemic events or symptoms.  Family/Social History:  -Fhx: father with hypertension -Tobacco use: denies -Alcohol use: denies  Insurance coverage/medication affordability: dnbi  Medication adherence reported but started medication last week.  Current diabetes medications include: Lantus 7 units daily, Victoza 1.2 mg daily Current hypertension medications include: lisinopril 2.5 mg  Current hyperlipidemia medications include: atorvastatin 40 mg daily  Patient reported dietary habits: Eats 2-3 meals/day Breakfast: bread, coffee,  Lunch/dinner: fish, rice, sweet potato, some vegetables Snacks: saltine crackers, little cookies  Drinks: soda and water   Patient-reported exercise habits: walks outside around the house in the mornings and walks to friends houses too   Patient denies nocturia (nighttime urination) Patient denies neuropathy (nerve pain). Patient denies visual changes. Patient reports self foot exams.    O:   Home fasting blood sugars: mostly 140s-150s, reports a value of 251 on morning of 01/23/2021  Lab Results  Component Value Date   HGBA1C 9.9 (A) 01/23/2021   There were no vitals filed for this visit.  Lipid Panel     Component Value Date/Time   CHOL 179 08/15/2020 0900   TRIG 183 (H) 08/15/2020 0900   HDL 37 (L) 08/15/2020 0900    CHOLHDL 4.8 (H) 08/15/2020 0900   CHOLHDL 3.5 07/30/2018 0335   VLDL 27 07/30/2018 0335   LDLCALC 110 (H) 08/15/2020 0900    Clinical Atherosclerotic Cardiovascular Disease (ASCVD): Yes  The ASCVD Risk score Denman George DC Jr., et al., 2013) failed to calculate for the following reasons:   The patient has a prior MI or stroke diagnosis   A/P: Diabetes longstanding currently uncontrolled. Medication adherence reported but did not start until a week ago. Ensured that pt was titrating victoza appropriately and taking insulin as prescribed. A1c did increase today to 9.9 up from 8.4 in January. Increase most likely due to not having medication for part of that time. Will start victoza 1.2 mg this week and will decrease insulin to decrease risk of hypoglycemia. Informed patient that fatigue was due to relative hypoglycemia and that this should resolve soon. Will not restart metformin now due to decrease in kidney function and Scr increase. -Continue Victoza titration (1.2 mg daily this week, then 1.8 mg daily thereafter) -Decrease Lantus to 5u daily.  -Continue to hold metformin for now.  -Extensively discussed pathophysiology of diabetes, recommended lifestyle interventions, dietary effects on blood sugar control -Counseled on s/sx of and management of hypoglycemia -Next A1C anticipated 03/2021.  ASCVD risk - secondary prevention in patient with diabetes. Last LDL is not controlled. high intensity statin indicated. Aspirin is indicated -Continue atorvastatin 40 mg daily.   Written patient instructions provided.  Total time in face to face counseling 30 minutes.   Follow up Pharmacist Clinic Visit in 1 week.   Butch Penny, PharmD, [place], CPP Clinical Pharmacist [facility] [phone],  PharmD Candidate [employer] of 20[employer]

## 2021-01-29 NOTE — Progress Notes (Signed)
    S:     PCP: Gwinda Passe PMH: T2DM, HTN, MI (CAD s/p CABG) in 2019, HLD, CRI (chronic renal insufficiency) stage 3  Patient arrives in good spirits accompanied with her daughter as her Nurse, learning disability. Presents for diabetes evaluation, education, and management. Patient was referred and last seen by Primary Care Provider on 11/14/2020. Last seen by pharmacy on 01/23/21. At that visit, A1C increased from 8.4% to 9.9%. Victoza was increased to 1.2 mg daily and Lantus was decreased from 7 units to 5 units daily to reduce hypoglycemia risk.  Today, patient brought medications with her and reports medication adherence with Victoza 1.2 mg daily. Reports taking Lantus 7 units daily instead of 5 units daily as prescribed. Glucometer in the last two weeks shows FBG ranging 158-250s with a high of 349 and PPBG 220 and 331. Denies BG<70, however reports some sweating (unsure if related to hot weather or low sugar)  Family/Social History:  -Fhx: father with hypertension -Tobacco use: denies -Alcohol use: denies  Insurance coverage/medication affordability: dnbi  Medication adherence reported good.   Current diabetes medications include: Lantus 5 units daily (taking 7 units daily), Victoza 1.2 mg daily Current hypertension medications include: lisinopril 2.5 mg  Current hyperlipidemia medications include:atorvastatin 40 mg daily  Patient denies hypoglycemic events.  Patient reported dietary habits: Eats 2-3 meals/day Breakfast: bread, coffee,  Lunch/dinner: fish, rice, sweet potato, some vegetables Snacks: saltine crackers, little cookies  Drinks: soda and water   Patient-reported exercise habits: walks outside around the house in the mornings and walks to friends houses too  O:  POCT: 221 (post-prandial)  4/19 - 5/4 Home fasting blood sugars: 201, 198, 158, 251, 234, 176, 349, 215, 214 2 hour post-meal/random blood sugars: 220, 331   Lab Results  Component Value Date   HGBA1C 9.9  (A) 01/23/2021   There were no vitals filed for this visit.  Lipid Panel     Component Value Date/Time   CHOL 179 08/15/2020 0900   TRIG 183 (H) 08/15/2020 0900   HDL 37 (L) 08/15/2020 0900   CHOLHDL 4.8 (H) 08/15/2020 0900   CHOLHDL 3.5 07/30/2018 0335   VLDL 27 07/30/2018 0335   LDLCALC 110 (H) 08/15/2020 0900    Clinical Atherosclerotic Cardiovascular Disease (ASCVD): Yes  The ASCVD Risk score Denman George DC Jr., et al., 2013) failed to calculate for the following reasons:   The patient has a prior MI or stroke diagnosis    A/P: Diabetes longstanding currently uncontrolled. Medication adherence appears optimal. Ensured patient was taking the appropriate dose of Victoza 1.2 mg daily. Will plan to increase both Victoza and Lantus for additional BG control as she is averaging 200-250s with a few spikes in the 300s. Reviewed symptoms and management of hypoglycemia vs relative hypoglycemia which is usually temporarily. Patient verbalized understanding. -Increased Victoza (liraglutide) from 1.2 mg to 1.8 mg daily -Increase Lantus from 7 units to 12 units daily -Extensively discussed pathophysiology of diabetes, recommended lifestyle interventions, dietary effects on blood sugar control -Counseled on s/sx of and management of hypoglycemia -Next A1C anticipated 03/2021.   ASCVD risk - secondary prevention in patient with diabetes. Last LDL is not controlled. high intensity statin indicated. Aspirin is indicated -Continue atorvastatin 40 mg daily.   Written patient instructions provided.  Total time in face to face counseling 30 minutes.   Follow up Pharmacist Clinic Visit in 2 weeks.    Fabio Neighbors, PharmD, BCPS PGY2 Ambulatory Care Resident [facility]  Pharmacy

## 2021-01-30 ENCOUNTER — Other Ambulatory Visit: Payer: Self-pay

## 2021-01-30 ENCOUNTER — Ambulatory Visit: Payer: Self-pay | Attending: Primary Care | Admitting: Pharmacist

## 2021-01-30 DIAGNOSIS — E119 Type 2 diabetes mellitus without complications: Secondary | ICD-10-CM

## 2021-01-30 DIAGNOSIS — Z794 Long term (current) use of insulin: Secondary | ICD-10-CM

## 2021-01-30 LAB — GLUCOSE, POCT (MANUAL RESULT ENTRY): POC Glucose: 221 mg/dl — AB (ref 70–99)

## 2021-01-30 MED ORDER — LIRAGLUTIDE 18 MG/3ML ~~LOC~~ SOPN
PEN_INJECTOR | SUBCUTANEOUS | 2 refills | Status: DC
  Filled 2021-01-30: qty 9, 30d supply, fill #0
  Filled 2021-02-13: qty 6, 20d supply, fill #0

## 2021-01-30 MED ORDER — LANTUS SOLOSTAR 100 UNIT/ML ~~LOC~~ SOPN
12.0000 [IU] | PEN_INJECTOR | Freq: Every day | SUBCUTANEOUS | 2 refills | Status: DC
  Filled 2021-01-30: qty 15, 125d supply, fill #0
  Filled 2021-02-13: qty 3, 25d supply, fill #0

## 2021-02-06 ENCOUNTER — Other Ambulatory Visit: Payer: Self-pay

## 2021-02-12 NOTE — Progress Notes (Signed)
    S:     PCP: Gwinda Passe PMH: T2DM, HTN, MI (CAD s/p CABG) in 2019, HLD, CRI (chronic renal insufficiency) stage 3  Patient arrives in good spirits accompanied with her daughter as her Nurse, learning disability. Presents for diabetes evaluation, education, and management. Patient was referred and last seen by Primary Care Provider on 11/14/2020.Last seen by pharmacy on 01/30/21. At that visit, home FBG ranged from 158 to the 250s with a high of 349. Reported PPBG values included 220 and 331. Victoza was increased from 1.2 mg to 1.8 mg daily and Lantus was increased from 7 units to 12 units daily.  Today, patient reports medication adherence with Victoza 1.8 mg daily and Lantus 12 units daily. Glucometer in the last two weeks shows FBG ranging 160-226 with a 7-day average of 217. Denies BG<70. Reports eating a large meal consisting of rice and fruits today, and likes to drink soda.  Family/Social History:  -Fhx: father with hypertension -Tobacco use: denies -Alcohol use: denies  Insurance coverage/medication affordability: dnbi  Medication adherence reported good.   Current diabetes medications include: Lantus 12 units daily, Victoza 1.8mg  daily Current hypertension medications include: lisinopril 2.5 mg  Current hyperlipidemia medications include:atorvastatin40 mg daily  Patient denies hypoglycemic events.  Patient reported dietary habits: Eats 2-3 meals/day Breakfast: bread, coffee,  Lunch/dinner: fish, rice, sweet potato, some vegetables Snacks: saltine crackers, little cookies  Drinks: soda and water   Patient-reported exercise habits: walks outside around the house in the mornings and walks to friends houses too  O:  POCT: 314 (post-prandial)  Home fasting blood sugars: 226, 208, 198, 181, 206, 208, 163, 164, 160 2 hour post-meal/random blood sugars: not checking   Lab Results  Component Value Date   HGBA1C 9.9 (A) 01/23/2021   There were no vitals filed for this  visit.  Lipid Panel     Component Value Date/Time   CHOL 179 08/15/2020 0900   TRIG 183 (H) 08/15/2020 0900   HDL 37 (L) 08/15/2020 0900   CHOLHDL 4.8 (H) 08/15/2020 0900   CHOLHDL 3.5 07/30/2018 0335   VLDL 27 07/30/2018 0335   LDLCALC 110 (H) 08/15/2020 0900    Clinical Atherosclerotic Cardiovascular Disease (ASCVD): Yes  The ASCVD Risk score Denman George DC Jr., et al., 2013) failed to calculate for the following reasons:   The patient has a prior MI or stroke diagnosis    A/P: Diabetes longstanding currently uncontrolled. Medication adherence appears optimal. Patient is able to verbalize appropriate hypoglycemia management plan. Encouraged patient to start checking sugars at least 2-hours after largest meal in addition to fasting sugars. Encouraged patient to reduce soda intake and to consider flavored water. Patient verbalized understanding. -Increased Lantus from 12 units to 16 units daily -Continued Victoza 1.8 mg daily -Extensively discussed pathophysiology of diabetes, recommended lifestyle interventions, dietary effects on blood sugar control -Counseled on s/sx of and management of hypoglycemia -Next A1C anticipated 03/2021.   ASCVD risk - secondary prevention in patient with diabetes. Last LDL is notcontrolled. high intensity statin indicated. Aspirin isindicated -Continuedatorvastatin 40 mg daily.   Written patient instructions provided.  Total time in face to face counseling 20 minutes.   Follow up Pharmacist Clinic Visit in 4 weeks.     Fabio Neighbors, PharmD, BCPS PGY2 Ambulatory Care Resident [facility]  Pharmacy

## 2021-02-13 ENCOUNTER — Ambulatory Visit: Payer: Self-pay | Attending: Family Medicine | Admitting: Pharmacist

## 2021-02-13 ENCOUNTER — Other Ambulatory Visit: Payer: Self-pay

## 2021-02-13 DIAGNOSIS — Z794 Long term (current) use of insulin: Secondary | ICD-10-CM

## 2021-02-13 DIAGNOSIS — E119 Type 2 diabetes mellitus without complications: Secondary | ICD-10-CM

## 2021-02-13 LAB — GLUCOSE, POCT (MANUAL RESULT ENTRY): POC Glucose: 314 mg/dl — AB (ref 70–99)

## 2021-02-13 MED ORDER — LANTUS SOLOSTAR 100 UNIT/ML ~~LOC~~ SOPN
16.0000 [IU] | PEN_INJECTOR | Freq: Every day | SUBCUTANEOUS | 2 refills | Status: DC
  Filled 2021-02-13: qty 15, 93d supply, fill #0

## 2021-03-18 ENCOUNTER — Ambulatory Visit: Payer: Self-pay | Admitting: Pharmacist

## 2021-03-27 ENCOUNTER — Telehealth (INDEPENDENT_AMBULATORY_CARE_PROVIDER_SITE_OTHER): Payer: Self-pay

## 2021-03-27 NOTE — Telephone Encounter (Signed)
Copied from CRM [id number]. Topic: General - Other >> Mar 26, 2021  8:17 AM Wyonia Hough E wrote: Reason for CRM: Pts daughter called and stated that the pt is concerned that with her health issue she may not need to get the covid vaccine/ pt has not been vaccinated and wanted to know what Marcelino Duster thinks/ Pt is leaving to go out of [place] country in [place] and wants to know if she should get the vaccine with having her health issues asap / please advise

## 2021-03-28 NOTE — Telephone Encounter (Signed)
Erika Bates called to follow up on the question about her mom getting the covid vaccine/ if she is going to get it she will need to asap to complete the set / please advise asap

## 2021-04-02 NOTE — Telephone Encounter (Signed)
Spoke woke daughter Erika Bates mother is going back home wanted to know which COVID vaccine flight is August 14- spoke with pulmonologist 1 vaccine is better than none information passed on

## 2021-04-10 ENCOUNTER — Other Ambulatory Visit: Payer: Self-pay

## 2021-04-10 ENCOUNTER — Telehealth (INDEPENDENT_AMBULATORY_CARE_PROVIDER_SITE_OTHER): Payer: Self-pay | Admitting: Primary Care

## 2021-04-10 ENCOUNTER — Encounter (INDEPENDENT_AMBULATORY_CARE_PROVIDER_SITE_OTHER): Payer: Self-pay | Admitting: Primary Care

## 2021-04-10 DIAGNOSIS — G4700 Insomnia, unspecified: Secondary | ICD-10-CM

## 2021-04-10 DIAGNOSIS — R519 Headache, unspecified: Secondary | ICD-10-CM

## 2021-04-10 NOTE — Progress Notes (Signed)
[facility]   Telephone Note  I connected with Erika Bates, on 04/10/2021 at 11:24 AM through by telephone and verified that I am speaking with the correct person using two identifiers.   Consent: I discussed the limitations, risks, security and privacy concerns of performing an evaluation and management service by telephone and the availability of in person appointments. I also discussed with the patient that there may be a patient responsible charge related to this service. The patient expressed understanding and agreed to proceed.   Location of Patient: Home  Location of Provider:  Primary Care at [place]   Persons participating in Telemedicine visit: Erika Mems,  NP Erika Bates , CMA  History of Present Illness: Ms. Erika Bates is a 70 year old having a visit with permission for her daughter to interpret she is complaining of recent headaches unable to identify any triggers or factors.  Denies consuming a lot of caffeine i.e. dark sodas, coffee, chocolate, or alcohol.  She does admit to photosensitivity.  She does not need to not sleeping well at night. Past Medical History:  Diagnosis Date   Dialysis patient [facility]) [phone]   Hypertension    Myocardial infarction ([place]) 01/2018   Pre-diabetes    No Known Allergies  Current Outpatient Medications on File Prior to Visit  Medication Sig Dispense Refill   acetaminophen (TYLENOL) 325 MG tablet Take 2 tablets (650 mg total) by mouth every 6 (six) hours as needed for mild pain.     aspirin EC 81 MG EC tablet Take 1 tablet (81 mg total) by mouth daily. 30 tablet 11   atorvastatin (LIPITOR) 40 MG tablet Take 1 tablet (40 mg total) by mouth daily. 30 tablet 2   Blood Glucose Monitoring Suppl (TRUE METRIX METER) w/Device KIT Check blood sugar three times daily. 1 kit 0   clopidogrel (PLAVIX) 75 MG tablet Take 1 tablet (75 mg total) by mouth daily.  90 tablet 3   glucose blood (TRUE METRIX BLOOD GLUCOSE TEST) test strip Check blood sugar three times daily. 100 each 2   insulin glargine (LANTUS SOLOSTAR) 100 UNIT/ML Solostar Pen Inject 16 Units into the skin daily. 15 mL 2   Insulin Pen Needle 32G X 4 MM MISC USE TO INJECT LANTUS ONCE DAILY. 100 each 2   liraglutide (VICTOZA) 18 MG/3ML SOPN Take 1.8 mg daily 6 mL 2   lisinopril (ZESTRIL) 2.5 MG tablet TAKE 1 TABLET DAILY FOR RENAL PROTECTION PER ADA 90 tablet 1   TRUEplus Lancets 28G MISC Check blood sugar three times daily. 100 each 2   [DISCONTINUED] glimepiride (AMARYL) 4 MG tablet Take 1 tablet (4 mg total) by mouth daily before breakfast. MUST MAKE APPT FOR FURTHER REFILLS 90 tablet 1   No current facility-administered medications on file prior to visit.    Observations/Objective: There were no vitals taken for this visit. Review of Systems  Neurological:  Positive for headaches.       Whole head no specific location admits to drinking coffee and sodas daily and does not sleep well at night    Assessment and Plan: [name] was seen today for follow-up.  Diagnoses and all orders for this visit:  Nonintractable headache, unspecified chronicity pattern, unspecified headache type Probable cause of headaches not resting or sleeping well at night.  Limited with available options being 70 years old and chronic renal failure.  Headache log may be useful to determine any associated triggers causing headaches.  Insomnia, unspecified type Unable to sleep at night and difficulty falling back to sleep recommended over-the-counter melatonin try not to watch TV eat before going to bed and have a quiet room without distractions or relaxing sounds or if able try to exercise before bedtime.  Probable cause of headaches.  Follow Up Instructions:   I discussed the assessment and treatment plan with the patient. The patient was provided an opportunity to ask questions and all were answered. The  patient agreed with the plan and demonstrated an understanding of the instructions.   The patient was advised to call back or seek an in-person evaluation if the symptoms worsen or if the condition fails to improve as anticipated.     I provided 15 minutes total of non-face-to-face time during this encounter including median intraservice time, reviewing previous notes, investigations, ordering medications, medical decision making, coordinating care and patient verbalized understanding at the end of the visit.    This note has been created with Surveyor, quantity. Any transcriptional errors are unintentional.   Kerin Perna, NP 04/10/2021, 11:24 AM

## 2021-04-25 ENCOUNTER — Other Ambulatory Visit: Payer: Self-pay

## 2021-04-25 ENCOUNTER — Ambulatory Visit (INDEPENDENT_AMBULATORY_CARE_PROVIDER_SITE_OTHER): Payer: Self-pay | Admitting: Primary Care

## 2021-04-25 DIAGNOSIS — E119 Type 2 diabetes mellitus without complications: Secondary | ICD-10-CM

## 2021-04-25 DIAGNOSIS — Z794 Long term (current) use of insulin: Secondary | ICD-10-CM

## 2021-04-25 DIAGNOSIS — Z760 Encounter for issue of repeat prescription: Secondary | ICD-10-CM

## 2021-04-25 MED ORDER — GLIMEPIRIDE 4 MG PO TABS
4.0000 mg | ORAL_TABLET | Freq: Every day | ORAL | 1 refills | Status: DC
Start: 2021-04-25 — End: 2023-07-23
  Filled 2021-04-25: qty 30, 30d supply, fill #0

## 2021-04-25 MED ORDER — LIRAGLUTIDE 18 MG/3ML ~~LOC~~ SOPN
PEN_INJECTOR | SUBCUTANEOUS | 2 refills | Status: DC
  Filled 2021-04-25: qty 9, 30d supply, fill #0

## 2021-04-25 MED ORDER — LANTUS SOLOSTAR 100 UNIT/ML ~~LOC~~ SOPN
24.0000 [IU] | PEN_INJECTOR | Freq: Every day | SUBCUTANEOUS | 3 refills | Status: DC
Start: 2021-04-25 — End: 2023-07-23
  Filled 2021-04-25: qty 9, 37d supply, fill #0

## 2021-04-25 MED ORDER — ATORVASTATIN CALCIUM 40 MG PO TABS
40.0000 mg | ORAL_TABLET | Freq: Every day | ORAL | 1 refills | Status: DC
  Filled 2021-04-25: qty 30, 30d supply, fill #0

## 2021-04-25 NOTE — Progress Notes (Signed)
Established Patient Office Visit  Subjective:  Patient ID: Erika Bates, female    DOB: Mar 14, 1951  Age: 70 y.o. MRN: [id number]  CC: No chief complaint on file.   HPI Erika Bates presents for medication refills and f/u on DM/HTN. Denies shortness of breath, headaches, chest pain or lower extremity edema, sudden onset, vision changes, unilateral weakness, dizziness, paresthesias .Follow up  evaluation of Type 2 diabetes mellitus.  Current symptoms/problems include none . Past Medical History:  Diagnosis Date   Dialysis patient [facility]) [phone]   Hypertension    Myocardial infarction ([place]) 01/2018   Pre-diabetes     Past Surgical History:  Procedure Laterality Date   ABDOMINAL EXPLORATION SURGERY  >1990   "they thought it was problems w/my gallbladder; said it was my pancreas but they didn't take that out" (02/16/2018)   CARDIAC CATHETERIZATION  07/29/2018   CORONARY ARTERY BYPASS GRAFT N/A 08/02/2018   Procedure: CORONARY ARTERY BYPASS GRAFTING (CABG) times 2 using left internal mammary artery to LAD  and left greater saphenous vein to Ramus harvested endoscopically.;  Surgeon: Grace Isaac, MD;  Location: [place];  Service: Open Heart Surgery;  Laterality: N/A;   CORONARY STENT INTERVENTION N/A 02/16/2018   Procedure: CORONARY STENT INTERVENTION;  Surgeon: Leonie Man, MD;  Location: [place] CV LAB;  Service: Cardiovascular;  Laterality: N/A;   LEFT HEART CATH AND CORONARY ANGIOGRAPHY N/A 02/16/2018   Procedure: LEFT HEART CATH AND CORONARY ANGIOGRAPHY;  Surgeon: Leonie Man, MD;  Location: [place] CV [facility];  Service: Cardiovascular;  Laterality: N/A;   LEFT HEART CATH AND CORONARY ANGIOGRAPHY N/A 07/29/2018   Procedure: LEFT HEART CATH AND CORONARY ANGIOGRAPHY;  Surgeon: Lorretta Harp, MD;  Location: [place] [facility];  Service: Cardiovascular;  Laterality: N/A;   TEE WITHOUT CARDIOVERSION N/A 08/02/2018   Procedure: TRANSESOPHAGEAL ECHOCARDIOGRAM (TEE);   Surgeon: Grace Isaac, MD;  Location: [place];  Service: Open Heart Surgery;  Laterality: N/A;   TUBAL LIGATION      Family History  Problem Relation Age of Onset   Hypertension Father     Social History   Socioeconomic History   Marital status: Significant Other    Spouse name: Not on file   Number of children: Not on file   Years of education: Not on file   Highest education level: Not on file  Occupational History   Not on file  Tobacco Use   Smoking status: Never   Smokeless tobacco: Never  Vaping Use   Vaping Use: Never used  Substance and Sexual Activity   Alcohol use: No   Drug use: No   Sexual activity: Not Currently  Other Topics Concern   Not on file  Social History Narrative   Not on file   Social Determinants of Health   Financial Resource Strain: Not on file  Food Insecurity: Not on file  Transportation Needs: Not on file  Physical Activity: Not on file  Stress: Not on file  Social Connections: Not on file  Intimate Partner Violence: Not on file    Outpatient Medications Prior to Visit  Medication Sig Dispense Refill   acetaminophen (TYLENOL) 325 MG tablet Take 2 tablets (650 mg total) by mouth every 6 (six) hours as needed for mild pain.     aspirin EC 81 MG EC tablet Take 1 tablet (81 mg total) by mouth daily. 30 tablet 11   Blood Glucose Monitoring Suppl (TRUE METRIX METER) w/Device KIT Check blood sugar three  times daily. 1 kit 0   clopidogrel (PLAVIX) 75 MG tablet Take 1 tablet (75 mg total) by mouth daily. 90 tablet 3   glucose blood (TRUE METRIX BLOOD GLUCOSE TEST) test strip Check blood sugar three times daily. 100 each 2   Insulin Pen Needle 32G X 4 MM MISC USE TO INJECT LANTUS ONCE DAILY. 100 each 2   lisinopril (ZESTRIL) 2.5 MG tablet TAKE 1 TABLET DAILY FOR RENAL PROTECTION PER ADA 90 tablet 1   TRUEplus Lancets 28G MISC Check blood sugar three times daily. 100 each 2   atorvastatin (LIPITOR) 40 MG tablet Take 1 tablet (40 mg total)  by mouth daily. 30 tablet 2   insulin glargine (LANTUS SOLOSTAR) 100 UNIT/ML Solostar Pen Inject 16 Units into the skin daily. 15 mL 2   liraglutide (VICTOZA) 18 MG/3ML SOPN Take 1.8 mg daily 6 mL 2   No facility-administered medications prior to visit.    No Known Allergies  ROS Review of Systems  All other systems reviewed and are negative.    Objective:    Physical Exam  General: Vital signs reviewed.  Patient is well-developed and well-nourished, morbid obese female in no acute distress and cooperative with exam.  Head: Normocephalic and atraumatic. Eyes: EOMI, conjunctivae normal, no scleral icterus.  Neck: Supple, trachea midline, normal ROM, no JVD, masses, thyromegaly, or carotid bruit present.  Cardiovascular: RRR, S1 normal, S2 normal, no murmurs, gallops, or rubs. Pulmonary/Chest: Clear to auscultation bilaterally, no wheezes, rales, or rhonchi. Abdominal: Soft, non-tender, non-distended, BS +, no masses, organomegaly, or guarding present.  Musculoskeletal: No joint deformities, erythema, or stiffness, ROM full and nontender. Extremities: No lower extremity edema bilaterally,  pulses symmetric and intact bilaterally. No cyanosis or clubbing. Neurological: A&O x3, Strength is normal and symmetric bilaterally Skin: Warm, dry and intact. No rashes or erythema. Psychiatric: Normal mood and affect. speech and behavior is normal. Cognition and memory are normal.   There were no vitals taken for this visit. Wt Readings from Last 3 Encounters:  11/14/20 215 lb 12.8 oz (97.9 kg)  08/14/20 215 lb 12.8 oz (97.9 kg)  08/11/19 216 lb 6.4 oz (98.2 kg)     Health Maintenance Due  Topic Date Due   COVID-19 Vaccine (1) Never done   OPHTHALMOLOGY EXAM  Never done   MAMMOGRAM  Never done   Fecal DNA (Cologuard)  Never done   Zoster Vaccines- Shingrix (1 of 2) Never done   DEXA SCAN  Never done    There are no preventive care reminders to display for this patient.  No results  found for: TSH Lab Results  Component Value Date   WBC 7.9 04/25/2021   HGB 12.8 04/25/2021   HCT 40.0 04/25/2021   MCV 86 04/25/2021   PLT 220 04/25/2021   Lab Results  Component Value Date   NA 141 04/25/2021   K 4.5 04/25/2021   CO2 25 04/25/2021   GLUCOSE 287 (H) 04/25/2021   BUN 17 04/25/2021   CREATININE 1.49 (H) 04/25/2021   BILITOT 0.3 04/25/2021   ALKPHOS 83 04/25/2021   AST 15 04/25/2021   ALT 10 04/25/2021   PROT 7.3 04/25/2021   ALBUMIN 4.0 04/25/2021   CALCIUM 8.9 04/25/2021   ANIONGAP 8 08/07/2018   EGFR 38 (L) 04/25/2021   Lab Results  Component Value Date   CHOL 185 04/25/2021   Lab Results  Component Value Date   HDL 41 04/25/2021   Lab Results  Component Value Date  [place] 109 (H) 04/25/2021   Lab Results  Component Value Date   TRIG 202 (H) 04/25/2021   Lab Results  Component Value Date   CHOLHDL 4.5 (H) 04/25/2021   Lab Results  Component Value Date   HGBA1C 9.9 (A) 01/23/2021      Assessment & Plan:  Diagnoses and all orders for this visit:  Type 2 diabetes mellitus without complication, with long-term current use of insulin ([place])  Goal of therapy: Less than 6.5 hemoglobin A1c.  Foods that are high in carbohydrates are the following rice, potatoes, breads, sugars, and pastas.  Reduction in the intake (eating) will assist in lowering your blood sugars.  -     Lipid Panel -     CMP14+EGFR -     CBC with Differential  Medication refill -     liraglutide (VICTOZA) 18 MG/3ML SOPN; Take 1.8 mg daily -     insulin glargine (LANTUS SOLOSTAR) 100 UNIT/ML Solostar Pen; Inject 24 Units into the skin daily. -     Discontinue: atorvastatin (LIPITOR) 40 MG tablet; Take 1 tablet (40 mg total) by mouth daily. -     glimepiride (AMARYL) 4 MG tablet; Take 1 tablet (4 mg total) by mouth daily before breakfast. -     HgB A1c     Follow-up: No follow-ups on file.    Kerin Perna, NP

## 2021-04-26 ENCOUNTER — Telehealth (INDEPENDENT_AMBULATORY_CARE_PROVIDER_SITE_OTHER): Payer: Self-pay

## 2021-04-26 ENCOUNTER — Other Ambulatory Visit: Payer: Self-pay

## 2021-04-26 ENCOUNTER — Other Ambulatory Visit (INDEPENDENT_AMBULATORY_CARE_PROVIDER_SITE_OTHER): Payer: Self-pay | Admitting: Primary Care

## 2021-04-26 DIAGNOSIS — E119 Type 2 diabetes mellitus without complications: Secondary | ICD-10-CM

## 2021-04-26 LAB — CBC WITH DIFFERENTIAL/PLATELET
Basophils Absolute: 0.1 10*3/uL (ref 0.0–0.2)
Basos: 1 %
EOS (ABSOLUTE): 0.2 10*3/uL (ref 0.0–0.4)
Eos: 2 %
Hematocrit: 40 % (ref 34.0–46.6)
Hemoglobin: 12.8 g/dL (ref 11.1–15.9)
Immature Grans (Abs): 0 10*3/uL (ref 0.0–0.1)
Immature Granulocytes: 0 %
Lymphocytes Absolute: 3.3 10*3/uL — ABNORMAL HIGH (ref 0.7–3.1)
Lymphs: 42 %
MCH: 27.6 pg (ref 26.6–33.0)
MCHC: 32 g/dL (ref 31.5–35.7)
MCV: 86 fL (ref 79–97)
Monocytes Absolute: 0.5 10*3/uL (ref 0.1–0.9)
Monocytes: 6 %
Neutrophils Absolute: 3.9 10*3/uL (ref 1.4–7.0)
Neutrophils: 49 %
Platelets: 220 10*3/uL (ref 150–450)
RBC: 4.64 x10E6/uL (ref 3.77–5.28)
RDW: 13 % (ref 11.7–15.4)
WBC: 7.9 10*3/uL (ref 3.4–10.8)

## 2021-04-26 LAB — LIPID PANEL
Chol/HDL Ratio: 4.5 ratio — ABNORMAL HIGH (ref 0.0–4.4)
Cholesterol, Total: 185 mg/dL (ref 100–199)
HDL: 41 mg/dL (ref >39)
LDL Chol Calc (NIH): 109 mg/dL — ABNORMAL HIGH (ref 0–99)
Triglycerides: 202 mg/dL — ABNORMAL HIGH (ref 0–149)
VLDL Cholesterol Cal: 35 mg/dL (ref 5–40)

## 2021-04-26 LAB — CMP14+EGFR
ALT: 10 IU/L (ref 0–32)
AST: 15 IU/L (ref 0–40)
Albumin/Globulin Ratio: 1.2 (ref 1.2–2.2)
Albumin: 4 g/dL (ref 3.8–4.8)
Alkaline Phosphatase: 83 IU/L (ref 44–121)
BUN/Creatinine Ratio: 11 — ABNORMAL LOW (ref 12–28)
BUN: 17 mg/dL (ref 8–27)
Bilirubin Total: 0.3 mg/dL (ref 0.0–1.2)
CO2: 25 mmol/L (ref 20–29)
Calcium: 8.9 mg/dL (ref 8.7–10.3)
Chloride: 104 mmol/L (ref 96–106)
Creatinine, Ser: 1.49 mg/dL — ABNORMAL HIGH (ref 0.57–1.00)
Globulin, Total: 3.3 g/dL (ref 1.5–4.5)
Glucose: 287 mg/dL — ABNORMAL HIGH (ref 65–99)
Potassium: 4.5 mmol/L (ref 3.5–5.2)
Sodium: 141 mmol/L (ref 134–144)
Total Protein: 7.3 g/dL (ref 6.0–8.5)
eGFR: 38 mL/min/{1.73_m2} — ABNORMAL LOW (ref >59)

## 2021-04-26 MED ORDER — ATORVASTATIN CALCIUM 40 MG PO TABS: 40.0000 mg | ORAL_TABLET | Freq: Every day | ORAL | 1 refills | Status: DC

## 2021-04-26 NOTE — Telephone Encounter (Signed)
Per DPR left voicemail notifying daughter of patients lab results per PCP. Maryjean Morn, CMA

## 2021-04-26 NOTE — Telephone Encounter (Signed)
-----   Message from Grayce Sessions, NP sent at 04/26/2021  1:00 PM EDT ----- Your cholesterol is still high, but continued recommendations to make lifestyle changes. Your LDL is above normal. The LDL is the bad cholesterol. Over time and in combination with inflammation and other factors, this contributes to plaque which in turn may lead to stroke and/or heart attack down the road. Sometimes high LDL is primarily genetic, and people might be eating all the right foods but still have high numbers. Other times, there is room for improvement in one's diet and eating healthier can bring this number down and potentially reduce one's risk of heart attack and/or stroke.   To reduce your LDL, Remember - more fruits and vegetables, more fish, and limit red meat and dairy products. More soy, nuts, beans, barley, lentils, oats and plant sterol ester enriched margarine instead of butter. I also encourage eliminating sugar and processed food. Remember, shop on the outside of the grocery store and visit your [employer]. If you would like to talk with me about dietary changes plus or minus medications for your cholesterol, please let me know. We should recheck your cholesterol in 3-6 months. Take your atorvastatin 40mg  at bedtime

## 2021-05-02 ENCOUNTER — Other Ambulatory Visit: Payer: Self-pay

## 2021-05-22 ENCOUNTER — Other Ambulatory Visit: Payer: Self-pay

## 2023-07-23 ENCOUNTER — Ambulatory Visit (INDEPENDENT_AMBULATORY_CARE_PROVIDER_SITE_OTHER): Payer: Self-pay | Admitting: Primary Care

## 2023-07-23 ENCOUNTER — Encounter (INDEPENDENT_AMBULATORY_CARE_PROVIDER_SITE_OTHER): Payer: Self-pay | Admitting: Primary Care

## 2023-07-23 ENCOUNTER — Telehealth: Payer: Self-pay

## 2023-07-23 ENCOUNTER — Other Ambulatory Visit: Payer: Self-pay

## 2023-07-23 ENCOUNTER — Other Ambulatory Visit: Payer: Self-pay | Admitting: Pharmacist

## 2023-07-23 VITALS — BP 131/84 | HR 58 | Resp 16 | Ht 61.0 in | Wt 213.8 lb

## 2023-07-23 DIAGNOSIS — Z794 Long term (current) use of insulin: Secondary | ICD-10-CM

## 2023-07-23 DIAGNOSIS — E2839 Other primary ovarian failure: Secondary | ICD-10-CM

## 2023-07-23 DIAGNOSIS — R079 Chest pain, unspecified: Secondary | ICD-10-CM

## 2023-07-23 DIAGNOSIS — I10 Essential (primary) hypertension: Secondary | ICD-10-CM

## 2023-07-23 DIAGNOSIS — E119 Type 2 diabetes mellitus without complications: Secondary | ICD-10-CM

## 2023-07-23 DIAGNOSIS — E785 Hyperlipidemia, unspecified: Secondary | ICD-10-CM

## 2023-07-23 DIAGNOSIS — Z1211 Encounter for screening for malignant neoplasm of colon: Secondary | ICD-10-CM

## 2023-07-23 LAB — POCT GLYCOSYLATED HEMOGLOBIN (HGB A1C): HbA1c, POC (controlled diabetic range): 10.1 % — AB (ref 0.0–7.0)

## 2023-07-23 MED ORDER — BASAGLAR KWIKPEN 100 UNIT/ML ~~LOC~~ SOPN
14.0000 [IU] | PEN_INJECTOR | Freq: Every day | SUBCUTANEOUS | 2 refills | Status: DC
  Filled 2023-07-23: qty 6, 42d supply, fill #0

## 2023-07-23 MED ORDER — TRULICITY 0.75 MG/0.5ML ~~LOC~~ SOAJ
0.7500 mg | SUBCUTANEOUS | 1 refills | Status: DC
  Filled 2023-07-23: qty 2, 28d supply, fill #0

## 2023-07-23 NOTE — Patient Instructions (Signed)
Pneumococcal Conjugate Vaccine: What You Need to Know Many vaccine information statements are available in Spanish and other languages. See PromoAge.com.br. 1. Why get vaccinated? Pneumococcal conjugate vaccine can prevent pneumococcal disease. Pneumococcal disease refers to any illness caused by pneumococcal bacteria. These bacteria can cause many types of illnesses, including pneumonia, which is an infection of the lungs. Pneumococcal bacteria are one of the most common causes of pneumonia. Besides pneumonia, pneumococcal bacteria can also cause: Ear infections Sinus infections Meningitis (infection of the tissue covering the brain and spinal cord) Bacteremia (infection of the blood) Anyone can get pneumococcal disease, but children under 68 years old, people with certain medical conditions or other risk factors, and adults 65 years or older are at the highest risk. Most pneumococcal infections are mild. However, some can result in long-term problems, such as brain damage or hearing loss. Meningitis, bacteremia, and pneumonia caused by pneumococcal disease can be fatal. 2. Pneumococcal conjugate vaccine Pneumococcal conjugate vaccine helps protect against bacteria that cause pneumococcal disease. There are three pneumococcal conjugate vaccines (PCV13, PCV15, and PCV20). The different vaccines are recommended for different people based on age and medical status. Your health care provider can help you determine which type of pneumococcal conjugate vaccine, and how many doses, you should receive. Infants and young children usually need 4 doses of pneumococcal conjugate vaccine. These doses are recommended at 2, 4, 6, and 50-51 months of age. Older children and adolescents might need pneumococcal conjugate vaccine depending on their age and medical conditions or other risk factors if they did not receive the recommended doses as infants or young children. Adults 19 through 2 years old with certain  medical conditions or other risk factors who have not already received pneumococcal conjugate vaccine should receive pneumococcal conjugate vaccine. Adults 65 years or older who have not previously received pneumococcal conjugate vaccine should receive pneumococcal conjugate vaccine. Some people with certain medical conditions are also recommended to receive pneumococcal polysaccharide vaccine (a different type of pneumococcal vaccine known as PPSV23). Some adults who have previously received a pneumococcal conjugate vaccine may be recommended to receive another pneumococcal conjugate vaccine. 3. Talk with your health care provider Tell your vaccination provider if the person getting the vaccine: Has had an allergic reaction after a previous dose of any type of pneumococcal conjugate vaccine (PCV13, PCV15, PCV20, or an earlier pneumococcal conjugate vaccine known as PCV7), or to any vaccine containing diphtheria toxoid (for example, DTaP), or has any severe, life-threatening allergies In some cases, your health care provider may decide to postpone pneumococcal conjugate vaccination until a future visit. People with minor illnesses, such as a cold, may be vaccinated. People who are moderately or severely ill should usually wait until they recover. Your health care provider can give you more information. 4. Risks of a vaccine reaction Redness, swelling, pain, or tenderness where the shot is given, and fever, loss of appetite, fussiness (irritability), feeling tired, headache, muscle aches, joint pain, and chills can happen after pneumococcal conjugate vaccination. Young children may be at increased risk for seizures caused by fever after a pneumococcal conjugate vaccine if it is administered at the same time as inactivated influenza vaccine. Ask your health care provider for more information. People sometimes faint after medical procedures, including vaccination. Tell your provider if you feel dizzy or  have vision changes or ringing in the ears. As with any medicine, there is a very remote chance of a vaccine causing a severe allergic reaction, other serious injury, or death. 5.  What if there is a serious problem? An allergic reaction could occur after the vaccinated person leaves the clinic. If you see signs of a severe allergic reaction (hives, swelling of the face and throat, difficulty breathing, a fast heartbeat, dizziness, or weakness), call 9-1-1 and get the person to the nearest hospital. For other signs that concern you, call your health care provider. Adverse reactions should be reported to the Vaccine Adverse Event Reporting System (VAERS). Your health care provider will usually file this report, or you can do it yourself. Visit the VAERS website at www.vaers.LAgents.no or call 575-760-5650. VAERS is only for reporting reactions, and VAERS staff members do not give medical advice. 6. The National Vaccine Injury Compensation Program The [employer] Vaccine Injury Compensation Program (VICP) is a federal program that was created to compensate people who may have been injured by certain vaccines. Claims regarding alleged injury or death due to vaccination have a time limit for filing, which may be as short as two years. Visit the VICP website at SpiritualWord.at or call 650-878-8768 to learn about the program and about filing a claim. 7. How can I learn more? Ask your health care provider. Call your local or state health department. Visit the website of the Food and Drug Administration (FDA) for vaccine package inserts and additional information at FinderList.no. Contact the Centers for Disease Control and Prevention (CDC): Call 340-145-6958 (1-800-CDC-INFO) or Visit CDC's website at PicCapture.uy. Source: CDC Vaccine Information Statement (Interim) Pneumococcal Conjugate Vaccine (02/07/2022) This same material is available at  FootballExhibition.com.br for no charge. This information is not intended to replace advice given to you by your health care provider. Make sure you discuss any questions you have with your health care provider. Document Revised: 12/31/2022 Document Reviewed: 10/06/2022 Elsevier Patient Education  2024 Elsevier Inc.   Influenza Vaccine Injection What is this medication? INFLUENZA VACCINE (in floo EN zuh vak SEEN) reduces the risk of the influenza (flu). It does not treat influenza. It is still possible to get influenza after receiving this vaccine, but the symptoms may be less severe or not last as long. It works by helping your immune system learn how to fight off a future infection. This medicine may be used for other purposes; ask your health care provider or pharmacist if you have questions. COMMON BRAND NAME(S): Afluria Quadrivalent, FLUAD Quadrivalent, Fluarix Quadrivalent, Flublok Quadrivalent, FLUCELVAX Quadrivalent, Flulaval Quadrivalent, Fluzone Quadrivalent What should I tell my care team before I take this medication? They need to know if you have any of these conditions: Bleeding disorder like hemophilia Fever or infection Guillain-Barre syndrome or other neurological problems Immune system problems Infection with the human immunodeficiency virus (HIV) or AIDS Low blood platelet counts Multiple sclerosis An unusual or allergic reaction to influenza virus vaccine, latex, other medications, foods, dyes, or preservatives. Different brands of vaccines contain different allergens. Some may contain latex or eggs. Talk to your care team about your allergies to make sure that you get the right vaccine. Pregnant or trying to get pregnant Breastfeeding How should I use this medication? This vaccine is injected into a muscle or under the skin. It is given by your care team. A copy of Vaccine Information Statements will be given before each vaccination. Be sure to read this sheet carefully each time.  This sheet may change often. Talk to your care team to see which vaccines are right for you. Some vaccines should not be used in all age groups. Overdosage: If you think you have taken too  much of this medicine contact a poison control center or emergency room at once. NOTE: This medicine is only for you. Do not share this medicine with others. What if I miss a dose? This does not apply. What may interact with this medication? Certain medications that lower your immune system, such as etanercept, anakinra, infliximab, adalimumab Certain medications that prevent or treat blood clots, such as warfarin Chemotherapy or radiation therapy Phenytoin Steroid medications, such as prednisone or cortisone Theophylline Vaccines This list may not describe all possible interactions. Give your health care provider a list of all the medicines, herbs, non-prescription drugs, or dietary supplements you use. Also tell them if you smoke, drink alcohol, or use illegal drugs. Some items may interact with your medicine. What should I watch for while using this medication? Report any side effects that do not go away with your care team. Call your care team if any unusual symptoms occur within 6 weeks of receiving this vaccine. You may still catch the flu, but the illness is not usually as bad. You cannot get the flu from the vaccine. The vaccine will not protect against colds or other illnesses that may cause fever. The vaccine is needed every year. What side effects may I notice from receiving this medication? Side effects that you should report to your care team as soon as possible: Allergic reactions--skin rash, itching, hives, swelling of the face, lips, tongue, or throat Side effects that usually do not require medical attention (report these to your care team if they continue or are bothersome): Chills Fatigue Headache Joint pain Loss of appetite Muscle pain Nausea Pain, redness, or irritation at  injection site This list may not describe all possible side effects. Call your doctor for medical advice about side effects. You may report side effects to FDA at 1-800-FDA-1088. Where should I keep my medication? The vaccine is only given by your care team. It will not be stored at home. NOTE: This sheet is a summary. It may not cover all possible information. If you have questions about this medicine, talk to your doctor, pharmacist, or health care provider.  2024 Elsevier/Gold Standard (2022-02-25 00:00:00)

## 2023-07-23 NOTE — Telephone Encounter (Signed)
Daughter called requesting information about mammogram for mother. She will fax mammogram scholarship application. Screening process completed via telephone. [facility] (scholarship)

## 2023-07-23 NOTE — Progress Notes (Signed)
Subjective:  Patient ID: Erika Bates, female    DOB: [birth year]  Age: 72 y.o. MRN: [id number]  CC: physical    HPI Erika Bates presents for Follow-up of diabetes,( Patient has given her dtrNicholaus Bates permission to be at her appt and interpreted for her. HTN.and chest pain left side radiates to back , shortness of breath , headaches and dizzy.  Patient does not check blood sugar at home. She has been out of the country for 2 years instead of 6 months- [place].  Compliant with meds - No Checking CBGs? No  Fasting avg -   Postprandial average -  Exercising regularly? - No Watching carbohydrate intake? - No Neuropathy ? - No Hypoglycemic events - No  - Recovers with :   Pertinent ROS:  Polyuria - Yes Polydipsia - Yes Vision problems - No  Medications as noted below. Taking them regularly without complication/adverse reaction being reported today.   History Erika Bates has a past medical history of Dialysis patient [facility]) [phone]), Hypertension, Myocardial infarction (HCC) (01/2018), and Pre-diabetes.   She has a past surgical history that includes LEFT HEART CATH AND CORONARY ANGIOGRAPHY (N/A, 02/16/2018); CORONARY STENT INTERVENTION (N/A, 02/16/2018); Tubal ligation; Abdominal exploration surgery (>1990); Cardiac catheterization (07/29/2018); LEFT HEART CATH AND CORONARY ANGIOGRAPHY (N/A, 07/29/2018); Coronary artery bypass graft (N/A, 08/02/2018); and TEE without cardioversion (N/A, 08/02/2018).   Her family history includes Hypertension in her father.She reports that she has never smoked. She has never used smokeless tobacco. She reports that she does not drink alcohol and does not use drugs.  Current Outpatient Medications on File Prior to Visit  Medication Sig Dispense Refill   acetaminophen (TYLENOL) 325 MG tablet Take 2 tablets (650 mg total) by mouth Bates 6 (six) hours as needed for mild pain.     aspirin EC 81 MG EC tablet Take 1 tablet (81 mg total) by mouth daily. 30  tablet 11   atorvastatin (LIPITOR) 40 MG tablet Take 1 tablet (40 mg total) by mouth daily. 90 tablet 1   Blood Glucose Monitoring Suppl (TRUE METRIX METER) w/Device KIT Check blood sugar three times daily. 1 kit 0   clopidogrel (PLAVIX) 75 MG tablet Take 1 tablet (75 mg total) by mouth daily. 90 tablet 3   glimepiride (AMARYL) 4 MG tablet Take 1 tablet (4 mg total) by mouth daily before breakfast. 90 tablet 1   glucose blood (TRUE METRIX BLOOD GLUCOSE TEST) test strip Check blood sugar three times daily. 100 each 2   insulin glargine (LANTUS SOLOSTAR) 100 UNIT/ML Solostar Pen Inject 24 Units into the skin daily. 15 mL 3   liraglutide (VICTOZA) 18 MG/3ML SOPN Take 1.8 mg daily 6 mL 2   lisinopril (ZESTRIL) 2.5 MG tablet TAKE 1 TABLET DAILY FOR RENAL PROTECTION PER ADA 90 tablet 1   TRUEplus Lancets 28G MISC Check blood sugar three times daily. 100 each 2   No current facility-administered medications on file prior to visit.    ROS. Comprehensive ROS Pertinent positive and negative noted in HPI    Objective:  There were no vitals taken for this visit.  BP Readings from Last 3 Encounters:  11/14/20 114/73  08/14/20 114/68  08/11/19 114/71    Wt Readings from Last 3 Encounters:  11/14/20 215 lb 12.8 oz (97.9 kg)  08/14/20 215 lb 12.8 oz (97.9 kg)  08/11/19 216 lb 6.4 oz (98.2 kg)    Physical Exam Vitals reviewed.  Constitutional:      Appearance: Normal appearance. She is  obese.  HENT:     Right Ear: Tympanic membrane and external ear normal.     Left Ear: Tympanic membrane and external ear normal.     Nose: Nose normal.  Eyes:     Extraocular Movements: Extraocular movements intact.  Cardiovascular:     Rate and Rhythm: Normal rate and regular rhythm.  Pulmonary:     Effort: Pulmonary effort is normal.     Breath sounds: Normal breath sounds.  Abdominal:     General: Bowel sounds are normal. There is distension.     Palpations: Abdomen is soft.  Musculoskeletal:         General: Normal range of motion.     Cervical back: Normal range of motion and neck supple.  Skin:    General: Skin is warm and dry.  Neurological:     Mental Status: She is oriented to person, place, and time.  Psychiatric:        Mood and Affect: Mood normal.        Behavior: Behavior normal.   Lab Results  Component Value Date   HGBA1C 9.9 (A) 01/23/2021   HGBA1C 8.4 (A) 10/03/2020   HGBA1C 10.8 (A) 08/14/2020    Lab Results  Component Value Date   WBC 7.9 04/25/2021   HGB 12.8 04/25/2021   HCT 40.0 04/25/2021   PLT 220 04/25/2021   GLUCOSE 287 (H) 04/25/2021   CHOL 185 04/25/2021   TRIG 202 (H) 04/25/2021   HDL 41 04/25/2021   LDLCALC 109 (H) 04/25/2021   ALT 10 04/25/2021   AST 15 04/25/2021   NA 141 04/25/2021   K 4.5 04/25/2021   CL 104 04/25/2021   CREATININE 1.49 (H) 04/25/2021   BUN 17 04/25/2021   CO2 25 04/25/2021   INR 1.51 08/02/2018   HGBA1C 9.9 (A) 01/23/2021     Assessment & Plan:  Mette was seen today for new patient (initial visit).  Diagnoses and all orders for this visit:  Type 2 diabetes mellitus without complication, with long-term current use of insulin (HCC) -     POCT glycosylated hemoglobin (Hb A1C) -     CBC with Differential/Platelet -     Microalbumin / creatinine urine ratio -     MM DIGITAL SCREENING BILATERAL; Future  Essential hypertension -     CMP14+EGFR  Hyperlipidemia LDL goal <70 -     Lipid panel  Colon cancer screening -     Fecal occult blood, imunochemical  Estrogen deficiency -     MM DIGITAL SCREENING BILATERAL; Future -     DG Bone Density; Future  Chest pain, unspecified type -     EKG 12-Lead    There are no diagnoses linked to this encounter. I am having Jenalis Dutch maintain her aspirin EC, acetaminophen, clopidogrel, lisinopril, TRUEplus Lancets 28G, True Metrix Blood Glucose Test, True Metrix Meter, liraglutide, Lantus SoloStar, glimepiride, and atorvastatin.  No orders of the defined  types were placed in this encounter.    Follow-up:   No follow-ups on file.  The above assessment and management plan was discussed with the patient. The patient verbalized understanding of and has agreed to the management plan. Patient is aware to call the clinic if symptoms fail to improve or worsen. Patient is aware when to return to the clinic for a follow-up visit. Patient educated on when it is appropriate to go to the emergency department.   Gwinda Passe, NP-C

## 2023-07-29 ENCOUNTER — Other Ambulatory Visit (INDEPENDENT_AMBULATORY_CARE_PROVIDER_SITE_OTHER): Payer: Self-pay

## 2023-07-29 DIAGNOSIS — Z7984 Long term (current) use of oral hypoglycemic drugs: Secondary | ICD-10-CM

## 2023-07-29 DIAGNOSIS — Z794 Long term (current) use of insulin: Secondary | ICD-10-CM

## 2023-07-29 DIAGNOSIS — E119 Type 2 diabetes mellitus without complications: Secondary | ICD-10-CM

## 2023-07-30 LAB — CMP14+EGFR
ALT: 13 [IU]/L (ref 0–32)
AST: 16 [IU]/L (ref 0–40)
Albumin: 3.9 g/dL (ref 3.9–4.9)
Alkaline Phosphatase: 103 [IU]/L (ref 44–121)
BUN/Creatinine Ratio: 13 (ref 12–28)
BUN: 20 mg/dL (ref 8–27)
Bilirubin Total: 0.4 mg/dL (ref 0.0–1.2)
CO2: 24 mmol/L (ref 20–29)
Calcium: 9 mg/dL (ref 8.7–10.3)
Chloride: 101 mmol/L (ref 96–106)
Creatinine, Ser: 1.54 mg/dL — ABNORMAL HIGH (ref 0.57–1.00)
Globulin, Total: 3.7 g/dL (ref 1.5–4.5)
Glucose: 134 mg/dL — ABNORMAL HIGH (ref 70–99)
Potassium: 4.8 mmol/L (ref 3.5–5.2)
Sodium: 139 mmol/L (ref 134–144)
Total Protein: 7.6 g/dL (ref 6.0–8.5)
eGFR: 36 mL/min/{1.73_m2} — ABNORMAL LOW (ref >59)

## 2023-07-30 LAB — CBC WITH DIFFERENTIAL/PLATELET
Basophils Absolute: 0.1 10*3/uL (ref 0.0–0.2)
Basos: 1 %
EOS (ABSOLUTE): 0.5 10*3/uL — ABNORMAL HIGH (ref 0.0–0.4)
Eos: 7 %
Hematocrit: 43.5 % (ref 34.0–46.6)
Hemoglobin: 13.9 g/dL (ref 11.1–15.9)
Immature Grans (Abs): 0 10*3/uL (ref 0.0–0.1)
Immature Granulocytes: 0 %
Lymphocytes Absolute: 4.1 10*3/uL — ABNORMAL HIGH (ref 0.7–3.1)
Lymphs: 53 %
MCH: 28.3 pg (ref 26.6–33.0)
MCHC: 32 g/dL (ref 31.5–35.7)
MCV: 89 fL (ref 79–97)
Monocytes Absolute: 0.5 10*3/uL (ref 0.1–0.9)
Monocytes: 6 %
Neutrophils Absolute: 2.5 10*3/uL (ref 1.4–7.0)
Neutrophils: 33 %
Platelets: 272 10*3/uL (ref 150–450)
RBC: 4.91 x10E6/uL (ref 3.77–5.28)
RDW: 12.5 % (ref 11.7–15.4)
WBC: 7.7 10*3/uL (ref 3.4–10.8)

## 2023-07-30 LAB — LIPID PANEL
Chol/HDL Ratio: 4.7 ratio — ABNORMAL HIGH (ref 0.0–4.4)
Cholesterol, Total: 204 mg/dL — ABNORMAL HIGH (ref 100–199)
HDL: 43 mg/dL (ref >39)
LDL Chol Calc (NIH): 131 mg/dL — ABNORMAL HIGH (ref 0–99)
Triglycerides: 167 mg/dL — ABNORMAL HIGH (ref 0–149)
VLDL Cholesterol Cal: 30 mg/dL (ref 5–40)

## 2023-07-30 LAB — MICROALBUMIN / CREATININE URINE RATIO
Creatinine, Urine: 117.4 mg/dL
Microalb/Creat Ratio: 93 mg/g{creat} — ABNORMAL HIGH (ref 0–29)
Microalbumin, Urine: 109 ug/mL

## 2023-08-03 ENCOUNTER — Ambulatory Visit (INDEPENDENT_AMBULATORY_CARE_PROVIDER_SITE_OTHER): Payer: Self-pay | Admitting: *Deleted

## 2023-08-03 NOTE — Telephone Encounter (Signed)
  Chief Complaint: medication question- patient would like to know if she can continue her oral diabetic medication  Disposition: [] ED /[] Urgent Care (no appt availability in office) / [] Appointment(In office/virtual)/ []  [facility]/ [] Home Care/ [] Refused Recommended Disposition /[] [facility]/ [x]  Follow-up with PCP Additional Notes: Patient has appointment with Franky Macho on Friday- patient advised if she has not been advised to stop medications- then please continue to take then- I will notify PCP of medication she is taking.   Summary: medication Questions   Medication she received while out of the country .  Question if she should continue taking them. Please advise         Reason for Disposition  [1] Caller has medicine question about med NOT prescribed by PCP AND [2] triager unable to answer question (e.g., compatibility with other med, storage)  Answer Assessment - Initial Assessment Questions 1. NAME of MEDICINE: "What medicine(s) are you calling about?"     Diabetic medications: Metformin, glimepiride  2. QUESTION: "What is your question?" (e.g., double dose of medicine, side effect)     Not sure what the dosing is- patient was prescribed by providers out of the country- patient has been taking them since March- bottles are labeled with name of medication and when issued- but no dosing information.  3. PRESCRIBER: "Who prescribed the medicine?" Reason: if prescribed by specialist, call should be referred to that group.     Unknown  Patient wants to continue these medications until he visit with Franky Macho on Friday  Protocols used: Medication Question Call-A-AH

## 2023-08-03 NOTE — Telephone Encounter (Signed)
  Chief Complaint: Patient's daughter is calling to state her mother actually is taking 2 diabetes oral agents- metformin and glimepiride - she would like to continue these medication until she sees Franky Macho on Friday  Disposition: [] ED /[] Urgent Care (no appt availability in office) / [] Appointment(In office/virtual)/ []  [facility]/ [] Home Care/ [] Refused Recommended Disposition /[] [facility]/ [x]  Follow-up with PCP Additional Notes: Patient has been taking medication since she has been back in [place]. Patient does not know what her dosing is - but she will bring her bottles and medical records to her appointment with [facility] in hopes that there may be information that can be helpful. Please let her know if she needs to stop anything before her appointment - other wise she will continue on until she is seen.

## 2023-08-04 ENCOUNTER — Other Ambulatory Visit: Payer: Self-pay

## 2023-08-06 NOTE — Progress Notes (Deleted)
S:     No chief complaint on file.  72 y.o. female who presents for diabetes evaluation, education, and management.   Patient was referred and last seen by Primary Care Provider, Gwinda Passe, on 07/23/23 to establish care since [place]. At that visit A1C was 10.1 %, glucose was 134 mg/dl, and uACR was 93 mg/g. Patient reported polyuria and polydipsia. Patient was last seen by Pharmacist, Butch Penny, on 02/13/2021 .   PMH is significant for T2DM, HTN, MI (CAD s/p CABG) in 2019, HLD, CRI (chronic renal insufficiency) stage 3, Dialysis patient ([id number])  No stroke    Today, patient arrives in *** good spirits and presents without *** any assistance. ***Patient is accompanied by ***.   Patient reports Diabetes was diagnosed in ***.   Family/Social History:  HTN (father) Never smoked or drink alcohol (stated 2 years ago)  Current diabetes medications include: ? Past diabetes medications include: Lantus, Victoza, Metformin (stopped due to renal function decline), Glimepiride (stopped since patient endorsed symptoms of hypoglycemia)    Patient reports adherence to taking all medications as prescribed.  *** Patient denies adherence with medications, reports missing *** medications *** times per week, on average.  Do you feel that your medications are working for you? {YES NO:22349} Have you been experiencing any side effects to the medications prescribed? {YES NO:22349} Do you have any problems obtaining medications due to transportation or finances? {YES [id number] Insurance coverage: ***  Patient {Actions; denies-reports:120008} hypoglycemic events.  Reported home fasting blood sugars: ***  Reported 2 hour post-meal/random blood sugars: ***.  Patient {Actions; denies-reports:120008} nocturia (nighttime urination).  Patient {Actions; denies-reports:120008} neuropathy (nerve pain). Patient {Actions; denies-reports:120008} visual  changes. Patient {Actions; denies-reports:120008} self foot exams.   Patient reported dietary habits: Eats *** meals/day Breakfast: *** Lunch: *** Dinner: *** Snacks: *** Drinks: ***  Within the past 12 months, did you worry whether your food would run out before you got money to buy more? {YES NO:22349} Within the past 12 months, did the food you bought run out, and you didn't have money to get more? {YES NO:22349} PHQ-9 Score: ***  Patient-reported exercise habits: ***   O:   7 day average blood glucose: ***  Libre3 *** CGM Download today *** on *** % Time CGM is active: ***% Average Glucose: *** mg/dL Glucose Management Indicator: ***  Glucose Variability: ***% (goal <36%) Time in Goal:  - Time in range 70-180: ***% - Time above range: ***% - Time below range: ***% Observed patterns:   Lab Results  Component Value Date   HGBA1C 10.1 (A) 07/23/2023   There were no vitals filed for this visit.  Lipid Panel     Component Value Date/Time   CHOL 204 (H) 07/23/2023 1204   TRIG 167 (H) 07/23/2023 1204   HDL 43 07/23/2023 1204   CHOLHDL 4.7 (H) 07/23/2023 1204   CHOLHDL 3.5 07/30/2018 0335   VLDL 27 07/30/2018 0335   LDLCALC 131 (H) 07/23/2023 1204    Clinical Atherosclerotic Cardiovascular Disease (ASCVD): Yes  The ASCVD Risk score (Arnett DK, et al., 2019) failed to calculate for the following reasons:   The patient has a prior MI or stroke diagnosis   Patient has no coverage?   A/P: Diabetes longstanding currently uncontrolled. Patient is *** able to verbalize appropriate hypoglycemia management plan. Medication adherence appears ***. Control is suboptimal due to ***. -{Meds adjust:18428} basal insulin *** Lantus/Basaglar/Semglee (insulin glargine) *** Evaristo Bury (  insulin degludec) from *** units to *** units daily in the morning. Patient will continue to titrate 1 unit every *** days if fasting blood sugar > 100mg /dl until fasting blood sugars reach goal or next  visit.  -{Meds adjust:18428} rapid insulin *** Novolog (insulin aspart) *** Humalog (insulin lispro) from *** to ***.  -{Meds adjust:18428} GLP-1 *** Trulicity (dulaglutide) *** Ozempic (semaglutide) *** Mounjaro (tirzepatide) from *** mg to *** mg .  -{Meds adjust:18428} SGLT2-I *** Farxiga (dapagliflozin) *** Jardiance (empagliflozin) 10 mg. Counseled on sick day rules. -{Meds adjust:18428} metformin ***.  -Patient educated on purpose, proper use, and potential adverse effects of ***.  -Extensively discussed pathophysiology of diabetes, recommended lifestyle interventions, dietary effects on blood sugar control.  -Counseled on s/sx of and management of hypoglycemia.  -Next A1c anticipated ***.   ASCVD risk - primary ***secondary prevention in patient with diabetes. Last LDL is 131 mg/dl not at goal of <16  mg/dL. ASCVD risk factors include *** and 10-year ASCVD risk score of ***. {Desc; low/moderate/high:110033} intensity statin indicated.  -{Meds adjust:18428} ***statin *** mg.   Hypertension longstanding *** currently ***. Blood pressure goal of <130/80 *** mmHg. Medication adherence ***. Blood pressure control is suboptimal due to ***. -{Meds adjust:18428} *** mg.  Written patient instructions provided. Patient verbalized understanding of treatment plan.  Total time in face to face counseling *** minutes.    Follow-up:  Pharmacist *** PCP clinic visit in *** Patient seen with ***

## 2023-08-07 ENCOUNTER — Ambulatory Visit: Payer: Self-pay | Admitting: Pharmacist

## 2023-08-07 ENCOUNTER — Telehealth (INDEPENDENT_AMBULATORY_CARE_PROVIDER_SITE_OTHER): Payer: Self-pay

## 2023-08-07 NOTE — Telephone Encounter (Signed)
Copied from CRM [id number]. Topic: Appointment Scheduling - Scheduling Inquiry for Clinic >> Aug 07, 2023  8:42 AM De Blanch wrote: Reason for CRM:Pt daughter is calling, asking if Erika Bates has earlier appointments available for today. If not, is requesting a callback to reschedule today's appointment at 1:30. Please advise.

## 2023-08-09 ENCOUNTER — Encounter (HOSPITAL_COMMUNITY): Payer: Self-pay | Admitting: Emergency Medicine

## 2023-08-09 ENCOUNTER — Emergency Department (HOSPITAL_COMMUNITY)
Admission: EM | Admit: 2023-08-09 | Discharge: 2023-08-09 | Disposition: A | Discharge status: Home / Self Care | Payer: Self-pay | Attending: Emergency Medicine | Admitting: Emergency Medicine

## 2023-08-09 ENCOUNTER — Other Ambulatory Visit (INDEPENDENT_AMBULATORY_CARE_PROVIDER_SITE_OTHER): Payer: Self-pay | Admitting: Primary Care

## 2023-08-09 ENCOUNTER — Other Ambulatory Visit: Payer: Self-pay

## 2023-08-09 ENCOUNTER — Emergency Department (HOSPITAL_COMMUNITY): Payer: Self-pay

## 2023-08-09 DIAGNOSIS — Z794 Long term (current) use of insulin: Secondary | ICD-10-CM | POA: Insufficient documentation

## 2023-08-09 DIAGNOSIS — E119 Type 2 diabetes mellitus without complications: Secondary | ICD-10-CM | POA: Insufficient documentation

## 2023-08-09 DIAGNOSIS — N1832 Chronic kidney disease, stage 3b: Secondary | ICD-10-CM

## 2023-08-09 DIAGNOSIS — E782 Mixed hyperlipidemia: Secondary | ICD-10-CM

## 2023-08-09 DIAGNOSIS — R0789 Other chest pain: Secondary | ICD-10-CM | POA: Insufficient documentation

## 2023-08-09 DIAGNOSIS — I2510 Atherosclerotic heart disease of native coronary artery without angina pectoris: Secondary | ICD-10-CM | POA: Insufficient documentation

## 2023-08-09 LAB — CBC
HCT: 41.9 % (ref 36.0–46.0)
Hemoglobin: 13.8 g/dL (ref 12.0–15.0)
MCH: 28.3 pg (ref 26.0–34.0)
MCHC: 32.9 g/dL (ref 30.0–36.0)
MCV: 85.9 fL (ref 80.0–100.0)
Platelets: 240 10*3/uL (ref 150–400)
RBC: 4.88 MIL/uL (ref 3.87–5.11)
RDW: 12.5 % (ref 11.5–15.5)
WBC: 10.1 10*3/uL (ref 4.0–10.5)
nRBC: 0 % (ref 0.0–0.2)

## 2023-08-09 LAB — TROPONIN I (HIGH SENSITIVITY)
Troponin I (High Sensitivity): 3 ng/L (ref <18)
Troponin I (High Sensitivity): 3 ng/L (ref <18)

## 2023-08-09 LAB — BASIC METABOLIC PANEL
Anion gap: 10 (ref 5–15)
BUN: 21 mg/dL (ref 8–23)
CO2: 22 mmol/L (ref 22–32)
Calcium: 9.2 mg/dL (ref 8.9–10.3)
Chloride: 100 mmol/L (ref 98–111)
Creatinine, Ser: 1.52 mg/dL — ABNORMAL HIGH (ref 0.44–1.00)
GFR, Estimated: 37 mL/min — ABNORMAL LOW (ref >60)
Glucose, Bld: 175 mg/dL — ABNORMAL HIGH (ref 70–99)
Potassium: 3.9 mmol/L (ref 3.5–5.1)
Sodium: 132 mmol/L — ABNORMAL LOW (ref 135–145)

## 2023-08-09 MED ORDER — HYDROCODONE-ACETAMINOPHEN 5-325 MG PO TABS
1.0000 | ORAL_TABLET | Freq: Once | ORAL | Status: AC
  Administered 2023-08-09: 1 via ORAL
  Filled 2023-08-09: qty 1

## 2023-08-09 MED ORDER — PRAVASTATIN SODIUM 80 MG PO TABS
80.0000 mg | ORAL_TABLET | Freq: Every day | ORAL | 3 refills | Status: DC
  Filled 2023-08-09: qty 90, 90d supply, fill #0

## 2023-08-09 NOTE — ED Triage Notes (Signed)
Patient arrives ambulatory by POV with daughter c/o left sided chest pain radiating into left arm. Pain has subsided at this time.

## 2023-08-09 NOTE — ED Provider Notes (Signed)
[facility]GENC[facility] DEPARTMENT AT [facility] Provider Note   CSN: [id number] Arrival date & time: 08/09/23  1749     History {Add pertinent medical, surgical, social history, OB history to HPI:1} Chief Complaint  Patient presents with   Chest Pain    Erika Bates is a 72 y.o. female.  Patient has a history of diabetes and coronary artery disease.  She complains of some left-sided chest discomfort no shortness of breath no sweating.  The history is provided by the patient and medical records. No language interpreter was used.  Chest Pain Pain location:  L chest Pain quality: aching   Pain radiates to:  Does not radiate Pain severity:  Mild Onset quality:  Sudden Timing:  Intermittent Progression:  Waxing and waning Chronicity:  New Context: not breathing   Relieved by:  Nothing Associated symptoms: no abdominal pain, no back pain, no cough, no fatigue and no headache        Home Medications Prior to Admission medications   Medication Sig Start Date End Date Taking? Authorizing Provider  Dulaglutide (TRULICITY) 0.75 MG/0.5ML SOAJ Inject 0.75 mg into the skin once a week. 07/23/23   Hoy Register, MD  Insulin Glargine (BASAGLAR KWIKPEN) 100 UNIT/ML Inject 14 Units into the skin daily. 07/23/23   Hoy Register, MD  pravastatin (PRAVACHOL) 80 MG tablet Take 1 tablet (80 mg total) by mouth daily. 08/09/23   Grayce Sessions, NP      Allergies    Patient has no known allergies.    Review of Systems   Review of Systems  Constitutional:  Negative for appetite change and fatigue.  HENT:  Negative for congestion, ear discharge and sinus pressure.   Eyes:  Negative for discharge.  Respiratory:  Negative for cough.   Cardiovascular:  Positive for chest pain.  Gastrointestinal:  Negative for abdominal pain and diarrhea.  Genitourinary:  Negative for frequency and hematuria.  Musculoskeletal:  Negative for back pain.  Skin:  Negative for rash.   Neurological:  Negative for seizures and headaches.  Psychiatric/Behavioral:  Negative for hallucinations.     Physical Exam Updated Vital Signs BP 120/71 (BP Location: Right Arm)   Pulse 60   Temp 98.2 F (36.8 C) (Oral)   Resp 19   Ht 5\' 1"  (1.549 m)   Wt 96.6 kg   SpO2 98%   BMI 40.25 kg/m  Physical Exam Vitals and nursing note reviewed.  Constitutional:      Appearance: She is well-developed.  HENT:     Head: Normocephalic.     Nose: Nose normal.  Eyes:     General: No scleral icterus.    Conjunctiva/sclera: Conjunctivae normal.  Neck:     Thyroid: No thyromegaly.  Cardiovascular:     Rate and Rhythm: Normal rate and regular rhythm.     Heart sounds: No murmur heard.    No friction rub. No gallop.     Comments: Tenderness to left chest under her breast Pulmonary:     Breath sounds: No stridor. No wheezing or rales.  Chest:     Chest wall: No tenderness.  Abdominal:     General: There is no distension.     Tenderness: There is no rebound.  Musculoskeletal:        General: Normal range of motion.     Cervical back: Neck supple.  Lymphadenopathy:     Cervical: No cervical adenopathy.  Skin:    Findings: No erythema or rash.  Neurological:  Mental Status: She is alert and oriented to person, place, and time.     Motor: No abnormal muscle tone.     Coordination: Coordination normal.  Psychiatric:        Behavior: Behavior normal.     ED Results / Procedures / Treatments   Labs (all labs ordered are listed, but only abnormal results are displayed) Labs Reviewed  BASIC METABOLIC PANEL - Abnormal; Notable for the following components:      Result Value   Sodium 132 (*)    Glucose, Bld 175 (*)    Creatinine, Ser 1.52 (*)    GFR, Estimated 37 (*)    All other components within normal limits  CBC  TROPONIN I (HIGH SENSITIVITY)  TROPONIN I (HIGH SENSITIVITY)    EKG EKG Interpretation Date/Time:  Sunday August 09 2023 21:01:17 EST Ventricular  Rate:  65 PR Interval:  174 QRS Duration:  87 QT Interval:  550 QTC Calculation: 572 R Axis:   41  Text Interpretation: Sinus rhythm Low voltage, precordial leads Borderline T abnormalities, anterior leads Prolonged QT interval Confirmed by Bethann Berkshire [phone]) on 08/09/2023 9:21:50 PM  Radiology DG Chest 2 View  Result Date: 08/09/2023 CLINICAL DATA:  chest pain EXAM: CHEST - 2 VIEW COMPARISON:  Chest radiograph dated November 08, 2018 FINDINGS: The heart size and mediastinal contours are within normal limits. Prior CABG and median sternotomy. Stable 17 mm rounded mass in the left lower lobe is essentially unchanged since 2016 and favored to represent a partially calcified granuloma when correlating with prior cross-sectional imaging. No focal consolidation, pneumothorax, or pleural effusion. No acute osseous abnormality. IMPRESSION: No acute cardiopulmonary findings. Electronically Signed   By: Hart Robinsons M.D.   On: 08/09/2023 19:52    Procedures Procedures  {Document cardiac monitor, telemetry assessment procedure when appropriate:1}  Medications Ordered in ED Medications  HYDROcodone-acetaminophen (NORCO/VICODIN) 5-325 MG per tablet 1 tablet (1 tablet Oral Given 08/09/23 2150)    ED Course/ Medical Decision Making/ A&P   {   Click here for ABCD2, HEART and other calculatorsREFRESH Note before signing :1}                              Medical Decision Making Amount and/or Complexity of Data Reviewed Labs: ordered. Radiology: ordered.  Risk Prescription drug management.   Patient with chest pain and history of coronary artery disease.  First troponin negative  {Document critical care time when appropriate:1} {Document review of labs and clinical decision tools ie heart score, Chads2Vasc2 etc:1}  {Document your independent review of radiology images, and any outside records:1} {Document your discussion with family members, caretakers, and with  consultants:1} {Document social determinants of health affecting pt's care:1} {Document your decision making why or why not admission, treatments were needed:1} Final Clinical Impression(s) / ED Diagnoses Final diagnoses:  None    Rx / DC Orders ED Discharge Orders     None

## 2023-08-09 NOTE — Discharge Instructions (Addendum)
Follow-up with your family doctor this week for recheck 

## 2023-08-10 ENCOUNTER — Other Ambulatory Visit: Payer: Self-pay

## 2023-08-13 ENCOUNTER — Ambulatory Visit
Admission: RE | Admit: 2023-08-13 | Discharge: 2023-08-13 | Disposition: A | Discharge status: Home / Self Care | Payer: No Typology Code available for payment source | Source: Ambulatory Visit | Attending: Primary Care | Admitting: Primary Care

## 2023-08-13 DIAGNOSIS — E119 Type 2 diabetes mellitus without complications: Secondary | ICD-10-CM

## 2023-08-13 DIAGNOSIS — E2839 Other primary ovarian failure: Secondary | ICD-10-CM

## 2023-08-17 ENCOUNTER — Telehealth (INDEPENDENT_AMBULATORY_CARE_PROVIDER_SITE_OTHER): Payer: Self-pay | Admitting: Primary Care

## 2023-08-17 NOTE — Telephone Encounter (Signed)
Called and left VM with pt about upcoming apt. 08/18/23 10:10am

## 2023-08-18 ENCOUNTER — Ambulatory Visit (INDEPENDENT_AMBULATORY_CARE_PROVIDER_SITE_OTHER): Payer: Self-pay | Admitting: Primary Care

## 2023-08-18 ENCOUNTER — Encounter (INDEPENDENT_AMBULATORY_CARE_PROVIDER_SITE_OTHER): Payer: Self-pay | Admitting: Primary Care

## 2023-08-18 ENCOUNTER — Other Ambulatory Visit: Payer: Self-pay

## 2023-08-18 VITALS — BP 119/78 | HR 61 | Resp 16 | Wt 213.2 lb

## 2023-08-18 DIAGNOSIS — J9801 Acute bronchospasm: Secondary | ICD-10-CM

## 2023-08-18 DIAGNOSIS — Z09 Encounter for follow-up examination after completed treatment for conditions other than malignant neoplasm: Secondary | ICD-10-CM

## 2023-08-18 DIAGNOSIS — E782 Mixed hyperlipidemia: Secondary | ICD-10-CM

## 2023-08-18 DIAGNOSIS — Z23 Encounter for immunization: Secondary | ICD-10-CM

## 2023-08-18 DIAGNOSIS — Z2821 Immunization not carried out because of patient refusal: Secondary | ICD-10-CM

## 2023-08-18 MED ORDER — BASAGLAR KWIKPEN 100 UNIT/ML ~~LOC~~ SOPN
14.0000 [IU] | PEN_INJECTOR | Freq: Every day | SUBCUTANEOUS | 2 refills | Status: DC
  Filled 2023-08-18: qty 3, 21d supply, fill #0
  Filled 2023-10-07 – 2023-12-09 (×2): qty 3, 21d supply, fill #1
  Filled 2024-01-08: qty 3, 21d supply, fill #2

## 2023-08-18 MED ORDER — TRULICITY 0.75 MG/0.5ML ~~LOC~~ SOAJ
0.7500 mg | SUBCUTANEOUS | 1 refills | Status: DC
  Filled 2023-08-18: qty 2, 28d supply, fill #0
  Filled 2023-10-06 (×2): qty 2, 28d supply, fill #1

## 2023-08-18 MED ORDER — ALBUTEROL SULFATE HFA 108 (90 BASE) MCG/ACT IN AERS
2.0000 | INHALATION_SPRAY | Freq: Four times a day (QID) | RESPIRATORY_TRACT | 2 refills | Status: DC | PRN
  Filled 2023-08-18: qty 6.7, 25d supply, fill #0
  Filled 2023-12-09: qty 6.7, 25d supply, fill #1

## 2023-08-18 MED ORDER — PRAVASTATIN SODIUM 80 MG PO TABS: 80.0000 mg | ORAL_TABLET | Freq: Every day | ORAL | 3 refills | Status: DC

## 2023-08-18 NOTE — Patient Instructions (Signed)
Albuterol Dry Powder Inhaler (DPI) What is this medication? ALBUTEROL (al Gaspar Bidding) treats lung diseases, such as asthma, where the airways in the lungs narrow, causing trouble breathing or wheezing (bronchospasm). It is also used to treat asthma or prevent breathing problems during exercise. This medication works by opening the airways of the lungs, making it easier to breathe. It is often called a rescue- or quick-relief inhaler. This medicine may be used for other purposes; ask your health care provider or pharmacist if you have questions. COMMON BRAND NAME(S): ProAir digihaler, ProAir RespiClick What should I tell my care team before I take this medication? They need to know if you have any of these conditions: Diabetes Heart disease High blood pressure Irregular heartbeat or rhythm Pheochromocytoma Seizures Thyroid disease An unusual or allergic reaction to albuterol, other medications, foods, dyes, or preservatives Pregnant or trying to get pregnant Breastfeeding How should I use this medication? This medication is inhaled through the mouth. Take it as directed on the prescription label. Do not use it more often than directed. This medication comes with INSTRUCTIONS FOR USE. Ask your pharmacist for directions on how to use this medication. Read the information carefully. Talk to your pharmacist or care team if you have questions. Talk to your care team about the use of this medication in children. While it may be given to children as young as 4 for selected conditions, precautions do apply. Overdosage: If you think you have taken too much of this medicine contact a poison control center or emergency room at once. NOTE: This medicine is only for you. Do not share this medicine with others. What if I miss a dose? If you take this medication on a regular basis, take it as soon as you can. If it is almost time for your next dose, take only that dose. Do not take double or extra  doses. What may interact with this medication? Certain medications for blood pressure, heart disease, irregular heartbeat Certain medications for depression, anxiety, or other mental health conditions Diuretics MAOIs, such as Marplan, Nardil, and Parnate This list may not describe all possible interactions. Give your health care provider a list of all the medicines, herbs, non-prescription drugs, or dietary supplements you use. Also tell them if you smoke, drink alcohol, or use illegal drugs. Some items may interact with your medicine. What should I watch for while using this medication? Visit your care team for regular checks on your progress. Tell your care team if your symptoms do not start to get better or if they get worse. If your symptoms get worse or if you are using this medication more than normal, call your care team right away. You and your care team should develop an Asthma Action Plan that is just for you. Be sure to know what to do if you are in the yellow (asthma is getting worse) or red (medical alert) zones. Your mouth may get dry. Chewing sugarless gum or sucking hard candy and drinking plenty of water may help. Contact your care team if the problem does not go away or is severe. What side effects may I notice from receiving this medication? Side effects that you should report to your care team as soon as possible: Allergic reactions--skin rash, itching, hives, swelling of the face, lips, tongue, or throat Heart rhythm changes--fast or irregular heartbeat, dizziness, feeling faint or lightheaded, chest pain, trouble breathing Increase in blood pressure Muscle pain or cramps Wheezing or trouble breathing that is worse after  use Side effects that usually do not require medical attention (report to your care team if they continue or are bothersome): Change in taste Dry mouth Headache Sore throat Tremors or shaking Trouble sleeping This list may not describe all possible side  effects. Call your doctor for medical advice about side effects. You may report side effects to FDA at 1-800-FDA-1088. Where should I keep my medication? Keep out of the reach of children and pets. Store between 15 and 30 degrees C (59 and 86 degrees F). Keep inhaler away from extreme heat, cold or humidity. Get rid of it 13 months after removing it from the foil pouch, when the dose counter reads "0" or after the expiration date, whichever is first. To get rid of medications that are no longer needed or have expired: Take the medication to a medication take-back program. Check with your pharmacy or law enforcement to find a location. If you cannot return the medication, ask your care team how to get rid of this medication safely. NOTE: This sheet is a summary. It may not cover all possible information. If you have questions about this medicine, talk to your doctor, pharmacist, or health care provider.  2024 Elsevier/Gold Standard (2022-04-07 00:00:00)

## 2023-08-18 NOTE — Progress Notes (Signed)
  [facility]   Subjective:   Erika Bates is a 72 y.o. female presents for ED follow up Presented on  08/09/23, chest pain radiating to the left side of body neck, arm and back no shortness of breath or sweating. Today Patient has No headache, No chest pain, No abdominal pain - No Nausea, No new weakness tingling or numbness, No Cough - shortness of breath  Past Medical History:  Diagnosis Date   Dialysis patient [facility]) 1990-1991   Hypertension    Myocardial infarction (HCC) 01/2018   Pre-diabetes      No Known Allergies    Current Outpatient Medications on File Prior to Visit  Medication Sig Dispense Refill   Dulaglutide (TRULICITY) 0.75 MG/0.5ML SOAJ Inject 0.75 mg into the skin once a week. 2 mL 1   Insulin Glargine (BASAGLAR KWIKPEN) 100 UNIT/ML Inject 14 Units into the skin daily. 6 mL 2   pravastatin (PRAVACHOL) 80 MG tablet Take 1 tablet (80 mg total) by mouth daily. 90 tablet 3   No current facility-administered medications on file prior to visit.     Review of System: Comprehensive ROS Pertinent positive and negative noted in HPI    Objective:  BP 119/78   Pulse 61   Resp 16   Wt 213 lb 3.2 oz (96.7 kg)   SpO2 95%   BMI 40.28 kg/m   Filed Weights   08/18/23 1018  Weight: 213 lb 3.2 oz (96.7 kg)    Physical Exam: General Appearance: Well nourished, morbid obese in no apparent distress. Eyes: PERRLA, EOMs, conjunctiva no swelling or erythema Sinuses: No Frontal/maxillary tenderness ENT/Mouth: Ext aud canals clear, TMs without erythema, bulging. No erythema, swelling, or exudate on post pharynx.  Tonsils not swollen or erythematous. Hearing normal.  Neck: Supple, thyroid normal.  Respiratory: Respiratory effort normal, BS equal bilaterally without rales, rhonchi, wheezing or stridor.  Cardio: RRR with no MRGs. Brisk peripheral pulses without edema.  Abdomen: Soft, + BS.  Non tender, no guarding, rebound, hernias, masses. Lymphatics: Non  tender without lymphadenopathy.  Musculoskeletal: Full ROM, 5/5 strength, normal gait.  Skin: Warm, dry without rashes, lesions, ecchymosis.  Neuro: Cranial nerves intact. Normal muscle tone, no cerebellar symptoms. Sensation intact.  Psych: Awake and oriented X 3, normal affect, Insight and Judgment appropriate.    Assessment:  Shakeithia was seen today for hospitalization follow-up.  Diagnoses and all orders for this visit:  Influenza vaccination declined  Herpes zoster vaccination declined  Bronchospasm -     albuterol (VENTOLIN HFA) 108 (90 Base) MCG/ACT inhaler; Inhale 2 puffs into the lungs every 6 (six) hours as needed for wheezing or shortness of breath.  Mixed hyperlipidemia -     pravastatin (PRAVACHOL) 80 MG tablet; Take 1 tablet (80 mg total) by mouth daily.  Encounter for immunization -     Pneumococcal conjugate vaccine 20-valent  Medication refill 2/2 Other orders/ -     Dulaglutide (TRULICITY) 0.75 MG/0.5ML SOAJ; Inject 0.75 mg into the skin once a week. -     Insulin Glargine (BASAGLAR KWIKPEN) 100 UNIT/ML; Inject 14 Units into the skin daily.  Hospital discharge follow-up  See HPI  This note has been created with Dragon speech recognition software and Paediatric nurse. Any transcriptional errors are unintentional.   Grayce Sessions, NP 08/18/2023, 10:26 AM

## 2023-08-21 ENCOUNTER — Other Ambulatory Visit: Payer: Self-pay

## 2023-09-10 ENCOUNTER — Other Ambulatory Visit: Payer: Self-pay

## 2023-09-10 ENCOUNTER — Encounter: Payer: Self-pay | Admitting: Pharmacist

## 2023-09-10 ENCOUNTER — Telehealth: Payer: Self-pay | Admitting: Pharmacist

## 2023-09-10 ENCOUNTER — Ambulatory Visit: Payer: Self-pay | Attending: Primary Care | Admitting: Pharmacist

## 2023-09-10 DIAGNOSIS — I10 Essential (primary) hypertension: Secondary | ICD-10-CM

## 2023-09-10 DIAGNOSIS — E785 Hyperlipidemia, unspecified: Secondary | ICD-10-CM

## 2023-09-10 DIAGNOSIS — E119 Type 2 diabetes mellitus without complications: Secondary | ICD-10-CM

## 2023-09-10 DIAGNOSIS — Z7985 Long-term (current) use of injectable non-insulin antidiabetic drugs: Secondary | ICD-10-CM

## 2023-09-10 DIAGNOSIS — Z794 Long term (current) use of insulin: Secondary | ICD-10-CM

## 2023-09-10 LAB — POCT GLYCOSYLATED HEMOGLOBIN (HGB A1C): HbA1c, POC (controlled diabetic range): 10 % — AB (ref 0.0–7.0)

## 2023-09-10 MED ORDER — ATORVASTATIN CALCIUM 40 MG PO TABS
40.0000 mg | ORAL_TABLET | Freq: Every day | ORAL | 3 refills | Status: DC
  Filled 2023-09-10: qty 90, 90d supply, fill #0
  Filled 2023-12-09: qty 90, 90d supply, fill #1
  Filled 2024-03-01: qty 90, 90d supply, fill #2
  Filled 2024-07-01: qty 90, 90d supply, fill #3

## 2023-09-10 MED ORDER — PEN NEEDLES 32G X 4 MM MISC
2 refills | Status: DC
  Filled 2023-09-10: qty 100, 100d supply, fill #0
  Filled 2024-01-08: qty 100, 100d supply, fill #1

## 2023-09-10 MED ORDER — LISINOPRIL 2.5 MG PO TABS
2.5000 mg | ORAL_TABLET | Freq: Every day | ORAL | 1 refills | Status: DC
  Filled 2023-09-10: qty 90, 90d supply, fill #0
  Filled 2023-12-09: qty 90, 90d supply, fill #1

## 2023-09-10 NOTE — Telephone Encounter (Signed)
LIBERATE Study  Received referral for patient participation in the LIBERATE CGM Study. Contacted patient to discuss study and confirmed HIPAA identifiers. Confirmed patient was provided the LIBERATE Study Information Sheet and any questions were answered.   Confirmed that patient meets study criteria by having a diagnosis of Type 2 Diabetes, is not currently on insulin, and most recent A1c is >8%.  Patient provided verbal consent to participate in the study. Consent documented in electronic medical record.   @CGMFLO @  - Confirmed that patient has a compatible smart phone to [employer] app. ([email or web address]) - Asked to download and create a Josephine Igo account prior to first study visit.  - Scheduled first study visit for today. Confirmed patient has transportation to this appointment.  - Discussed use of MyChart in this study. Confirmed patient has an active MyChart account and is aware of their log in information.  Patient aware of pre-visit questionnaire that will be sent 2 days prior to their scheduled study visit and they will plan to complete before the visit.  Butch Penny, PharmD, P[place]y [place], CPP Clinical Pharmacist [facility] 2 [phone]

## 2023-09-10 NOTE — Progress Notes (Signed)
S:    PCP: Gwinda Passe  PMH: T2DM, HTN, NSTEMI (CAD s/p CABG) in 2019, HLD, CRI (chronic renal insufficiency) stage 3  72 y.o. female who presents for diabetes evaluation, education, and management in the context of the LIBERATE Study.   Patient arrives in good spirits accompanied with her daughter as her Nurse, learning disability. Presents for diabetes evaluation, education, and management. Patient was referred and last seen by Primary Care Provider on 08/18/2023. Of note, she saw her PCP in October and November of this year, however, prior to those visits, she had not been seen since 2022. She informs me today that she returned to [place]. While there she was on metformin and glyburide.   When she saw Marcelino Duster last month, she was changed from Victoza to Trulicity and Mariella Saa was reordered, however, she did not start the [employer] d/t not having insulin pen needles. She is not on insulin at this time.  Her atorvastatin was changed to pravastatin but I cannot see a documented reason why on her last PCP note. Of note, she is not on BB therapy as she could not tolerate. She should also be on a RAAS agent but it appears her lisinopril fell off. In additional to her heart disease, she has a hx of CKD and an elevated urinary microalbumin.  Today, patient reports that she is doing well. She is accompanied by her daughter. She is interested in participating in our LIBERATE study.   Family/Social History:  -Fhx: father with hypertension -Tobacco use: denies -Alcohol use: denies   Insurance coverage/medication affordability: dnbi  Medication adherence reported to everything except for [employer].   Current diabetes medications include: Basaglar 14 units daily (not taking), Trulicity 0.75 mg weekly Current hypertension medications include: none Current hyperlipidemia medications include: pravastatin 80 mg daily   Patient denies hypoglycemic events.  Patient reported dietary habits: Eats 2-3  meals/day Breakfast: bread, coffee,  Lunch/dinner: fish, rice, sweet potato, some vegetables Snacks: saltine crackers, little cookies  Drinks: soda and water    Patient-reported exercise habits: walks outside around the house in the mornings and walks to friends houses too  Not currently checking sugar levels at home.  O: Lab Results  Component Value Date   HGBA1C 10.0 (A) 09/10/2023   There were no vitals filed for this visit.  Lipid Panel     Component Value Date/Time   CHOL 204 (H) 07/23/2023 1204   TRIG 167 (H) 07/23/2023 1204   HDL 43 07/23/2023 1204   CHOLHDL 4.7 (H) 07/23/2023 1204   CHOLHDL 3.5 07/30/2018 0335   VLDL 27 07/30/2018 0335   LDLCALC 131 (H) 07/23/2023 1204    Clinical Atherosclerotic Cardiovascular Disease (ASCVD): Yes  The ASCVD Risk score (Arnett DK, et al., 2019) failed to calculate for the following reasons:   Risk score cannot be calculated because patient has a medical history suggesting prior/existing ASCVD      A/P: LIBERATE Study:  -Patient provided verbal consent to participate in the study. Consent documented in electronic medical record. Of note, she was prescribed insulin prior to this visit but not start it. Therefore, she meets study inclusion criteria.  -Provided education on Libre 3 CGM. Collaborated to ensure Erika Bates 3 app was downloaded on patient's phone. Educated on how to place sensor every 14 days, patient placed first sensor correctly and verbalized understanding of use, removal, and how to place next sensor. Discussed alarms. 8 sensors provided for a 3 month supply. Educated to contact the office if the sensor  falls off early and replacements are needed before their next [employer].   Diabetes longstanding currently uncontrolled. Medication adherence is suboptimal. I have ordered pen needles and will have her start insulin today. Patient is able to verbalize appropriate hypoglycemia management plan. Encouraged patient to  start checking sugars at least 2-hours after largest meal in addition to fasting sugars. Encouraged patient to reduce soda intake and to consider flavored water. Patient verbalized understanding. -Start Basaglar 14 units. -Continue Trulicity 0.75 mg weekly for now.  -Extensively discussed pathophysiology of diabetes, recommended lifestyle interventions, dietary effects on blood sugar control -Counseled on s/sx of and management of hypoglycemia -Next A1C anticipated 11/2023.   ASCVD risk - secondary prevention in patient with diabetes. Last LDL is not controlled. high intensity statin indicated. Changed pravastatin to atorvastatin.  -Stop pravastatin.  -Start atorvastatin 40 mg daily instead.  -Start low dose lisinopril for cardiorenal benefit.  -Anticipate CMP14+eGFR at follow-up.  Written patient instructions provided.  Total time in face to face counseling 20 minutes.   Follow up Pharmacist Clinic Visit in 4 weeks.     Butch Penny, PharmD, Patsy [place], CPP Clinical Pharmacist [facility] [phone]

## 2023-09-21 ENCOUNTER — Telehealth: Payer: Self-pay | Admitting: Pharmacist

## 2023-09-21 NOTE — Telephone Encounter (Signed)
LIBERATE Study  Patient completed first study visit for the LIBERATE CGM Study. Contacted patient to discuss CGM tolerability. Confirmed HIPAA identifiers.   .Date of Download: 12/16 - 09/20/23 % Time CGM is active: 93% Average Glucose: 199 mg/dL Glucose Management Indicator: 8.1  Glucose Variability: 25.5 (goal <36%) Time in Goal:  - Time in range 70-180: 54% - Time above range: 46% - Time below range: 0%  Patient confirms Libre 3 sensors is working and glucose values are transmitting appropriately. Denies any questions or concerns.   - Confirmed visit with me on 10/12/23. Confirmed patient has transportation to this appointment.  - Discussed use of MyChart in this study. Confirmed patient has an active MyChart account and is aware of their log in information.  Patient aware of pre-visit questionnaire that will be sent 2 days prior to their scheduled study visit and they will plan to complete before the visit.  Butch Penny, PharmD, Patsy [place], CPP Clinical Pharmacist [facility] [phone]

## 2023-09-24 ENCOUNTER — Other Ambulatory Visit: Payer: Self-pay

## 2023-10-06 ENCOUNTER — Other Ambulatory Visit: Payer: Self-pay

## 2023-10-07 ENCOUNTER — Other Ambulatory Visit: Payer: Self-pay

## 2023-10-12 ENCOUNTER — Telehealth (INDEPENDENT_AMBULATORY_CARE_PROVIDER_SITE_OTHER): Payer: Self-pay

## 2023-10-12 ENCOUNTER — Ambulatory Visit: Payer: No Typology Code available for payment source | Attending: Pharmacist | Admitting: Pharmacist

## 2023-10-12 NOTE — Telephone Encounter (Signed)
 Copied from CRM [id number]. Topic: General - Inquiry >> Oct 12, 2023  8:59 AM Myrick T wrote: Reason for CRM: patients daughter Talbert called stated she is not feeling well and can not bring patient to her appt with Herlene at 10am today. Please f/u to reschedule

## 2023-10-19 ENCOUNTER — Other Ambulatory Visit: Payer: Self-pay

## 2023-10-23 ENCOUNTER — Ambulatory Visit (INDEPENDENT_AMBULATORY_CARE_PROVIDER_SITE_OTHER): Payer: Self-pay | Admitting: Primary Care

## 2023-11-02 ENCOUNTER — Other Ambulatory Visit (INDEPENDENT_AMBULATORY_CARE_PROVIDER_SITE_OTHER): Payer: Self-pay

## 2023-11-02 DIAGNOSIS — E785 Hyperlipidemia, unspecified: Secondary | ICD-10-CM

## 2023-11-02 NOTE — Progress Notes (Unsigned)
Pt was suppose to have a 3 month f/u with provider but per provider pt doesn't need to be seen since she seen [facility] on 12/20 and has an upcoming appt with him on 2/20. Pt will just need a lipid panel and will need to schedule an appt for 3/12.   Explained to pt and daughter what the provider explained and per pt and daughter that is fine they doesn't have any concerns.

## 2023-11-03 LAB — LIPID PANEL
Chol/HDL Ratio: 3.5 {ratio} (ref 0.0–4.4)
Cholesterol, Total: 124 mg/dL (ref 100–199)
HDL: 35 mg/dL — ABNORMAL LOW (ref >39)
LDL Chol Calc (NIH): 71 mg/dL (ref 0–99)
Triglycerides: 93 mg/dL (ref 0–149)
VLDL Cholesterol Cal: 18 mg/dL (ref 5–40)

## 2023-11-12 ENCOUNTER — Other Ambulatory Visit (INDEPENDENT_AMBULATORY_CARE_PROVIDER_SITE_OTHER): Payer: Self-pay | Admitting: Primary Care

## 2023-11-13 ENCOUNTER — Other Ambulatory Visit: Payer: Self-pay

## 2023-11-13 MED ORDER — TRULICITY 0.75 MG/0.5ML ~~LOC~~ SOAJ
0.7500 mg | SUBCUTANEOUS | 0 refills | Status: DC
  Filled 2023-11-13: qty 2, 28d supply, fill #0

## 2023-11-13 NOTE — Telephone Encounter (Signed)
Requested by interface surescripts. Future visit in 3 weeks . Requested Prescriptions  Pending Prescriptions Disp Refills   Dulaglutide (TRULICITY) 0.75 MG/0.5ML SOAJ 2 mL 0    Sig: Inject 0.75 mg into the skin once a week.     Endocrinology:  Diabetes - GLP-1 Receptor Agonists Failed - 11/13/2023  9:48 AM      Failed - HBA1C is between 0 and 7.9 and within 180 days    HbA1c, POC (controlled diabetic range)  Date Value Ref Range Status  09/10/2023 10.0 (A) 0.0 - 7.0 % Final         Passed - Valid encounter within last 6 months    Recent Outpatient Visits           2 months ago Type 2 diabetes mellitus without complication, with long-term current use of insulin (HCC)   [facility] [place] Drucilla Chalet, RPH-CPP   2 months ago Hospital discharge follow-up   [place] [facility] Grayce Sessions, NP   3 months ago Type 2 diabetes mellitus without complication, with long-term current use of insulin (HCC)   [place] [facility] Grayce Sessions, NP   2 years ago Type 2 diabetes mellitus without complication, with long-term current use of insulin (HCC)   [place] [facility] Grayce Sessions, NP   2 years ago Nonintractable headache, unspecified chronicity pattern, unspecified headache type   [place] [facility] Grayce Sessions, NP       Future Appointments             In 6 days Lois Huxley, Cornelius Moras, RPH-CPP [facility] [place]   In 3 weeks Randa Evens, Kinnie Scales, NP [place] [facility]

## 2023-11-16 ENCOUNTER — Other Ambulatory Visit: Payer: Self-pay

## 2023-11-19 ENCOUNTER — Ambulatory Visit: Payer: No Typology Code available for payment source | Admitting: Pharmacist

## 2023-12-03 ENCOUNTER — Telehealth (INDEPENDENT_AMBULATORY_CARE_PROVIDER_SITE_OTHER): Payer: Self-pay | Admitting: Primary Care

## 2023-12-03 NOTE — Telephone Encounter (Signed)
 Called pt to remind them about appt. Pt did not answer and VM was left. Please advise

## 2023-12-08 ENCOUNTER — Telehealth (INDEPENDENT_AMBULATORY_CARE_PROVIDER_SITE_OTHER): Payer: Self-pay | Admitting: Primary Care

## 2023-12-08 NOTE — Telephone Encounter (Signed)
 Spoke to pt about appt.. Will be present

## 2023-12-09 ENCOUNTER — Ambulatory Visit (INDEPENDENT_AMBULATORY_CARE_PROVIDER_SITE_OTHER): Payer: Self-pay | Admitting: Primary Care

## 2023-12-09 ENCOUNTER — Other Ambulatory Visit: Payer: Self-pay

## 2023-12-09 ENCOUNTER — Other Ambulatory Visit (INDEPENDENT_AMBULATORY_CARE_PROVIDER_SITE_OTHER): Payer: Self-pay | Admitting: Primary Care

## 2023-12-09 ENCOUNTER — Encounter (INDEPENDENT_AMBULATORY_CARE_PROVIDER_SITE_OTHER): Payer: Self-pay | Admitting: Primary Care

## 2023-12-09 VITALS — BP 125/83 | HR 72 | Wt 205.8 lb

## 2023-12-09 DIAGNOSIS — E119 Type 2 diabetes mellitus without complications: Secondary | ICD-10-CM

## 2023-12-09 DIAGNOSIS — K5900 Constipation, unspecified: Secondary | ICD-10-CM

## 2023-12-09 DIAGNOSIS — Z794 Long term (current) use of insulin: Secondary | ICD-10-CM

## 2023-12-09 DIAGNOSIS — I10 Essential (primary) hypertension: Secondary | ICD-10-CM

## 2023-12-09 LAB — POCT GLYCOSYLATED HEMOGLOBIN (HGB A1C): HbA1c, POC (controlled diabetic range): 8.9 % — AB (ref 0.0–7.0)

## 2023-12-09 LAB — GLUCOSE, POCT (MANUAL RESULT ENTRY): POC Glucose: 164 mg/dL — AB (ref 70–99)

## 2023-12-09 MED ORDER — LISINOPRIL 2.5 MG PO TABS
2.5000 mg | ORAL_TABLET | Freq: Every day | ORAL | 1 refills | Status: DC
  Filled 2024-03-01: qty 90, 90d supply, fill #0

## 2023-12-09 MED ORDER — SENNA-DOCUSATE SODIUM 8.6-50 MG PO TABS
1.0000 | ORAL_TABLET | Freq: Every day | ORAL | 1 refills | Status: AC
  Filled 2023-12-09: qty 90, 90d supply, fill #0
  Filled 2024-03-01: qty 90, 90d supply, fill #1

## 2023-12-09 MED ORDER — TRULICITY 0.75 MG/0.5ML ~~LOC~~ SOAJ
0.7500 mg | SUBCUTANEOUS | 2 refills | Status: DC
  Filled 2023-12-09: qty 2, 28d supply, fill #0
  Filled 2024-01-08: qty 2, 28d supply, fill #1

## 2023-12-09 NOTE — Progress Notes (Signed)
 Subjective:  Patient ID: Erika Bates, female    DOB: Feb 10, 1951  Age: 73 y.o. MRN: [id number]  CC: Medical Management of Chronic Issues (Patient states to having dizziness sometimes and also has not seen luke )   Stark Bray presents for follow-up of diabetes. Patient does not check blood sugar at home. Libre doesn't sync with daughter phone /app Compliant with meds - Yes Checking CBGs? No  Fasting avg -   Postprandial average -  Exercising regularly? - Yes Watching carbohydrate intake? - Yes Neuropathy ? - sometime  Hypoglycemic events - No  - Recovers with :   Pertinent ROS:  Polyuria - Yes Polydipsia - No Vision problems - No Questions how many BM she was having  every 3-4 days. C/O abdominal pain LUQ  Medications as noted below. Taking them regularly without complication/adverse reaction being reported today.   History Bailyn has a past medical history of Dialysis patient [facility]) [phone]), Hypertension, Myocardial infarction (HCC) (01/2018), and Pre-diabetes.   She has a past surgical history that includes LEFT HEART CATH AND CORONARY ANGIOGRAPHY (N/A, 02/16/2018); CORONARY STENT INTERVENTION (N/A, 02/16/2018); Tubal ligation; Abdominal exploration surgery (>1990); Cardiac catheterization (07/29/2018); LEFT HEART CATH AND CORONARY ANGIOGRAPHY (N/A, 07/29/2018); Coronary artery bypass graft (N/A, 08/02/2018); and TEE without cardioversion (N/A, 08/02/2018).   Her family history includes Hypertension in her father.She reports that she has never smoked. She has never used smokeless tobacco. She reports that she does not drink alcohol and does not use drugs.  Current Outpatient Medications on File Prior to Visit  Medication Sig Dispense Refill   albuterol (VENTOLIN HFA) 108 (90 Base) MCG/ACT inhaler Inhale 2 puffs into the lungs every 6 (six) hours as needed for wheezing or shortness of breath. 6.7 g 2   atorvastatin (LIPITOR) 40 MG tablet Take 1 tablet (40 mg total) by  mouth daily. 90 tablet 3   Dulaglutide (TRULICITY) 0.75 MG/0.5ML SOAJ Inject 0.75 mg into the skin once a week. 2 mL 0   Insulin Glargine (BASAGLAR KWIKPEN) 100 UNIT/ML Inject 14 Units into the skin daily. 6 mL 2   Insulin Pen Needle (PEN NEEDLES) 32G X 4 MM MISC Use to inject insulin once a day. 100 each 2   lisinopril (ZESTRIL) 2.5 MG tablet Take 1 tablet (2.5 mg total) by mouth daily. 90 tablet 1   No current facility-administered medications on file prior to visit.    Review of Systems Comprehensive ROS Pertinent positive and negative noted in HPI   Objective:  BP 125/83 (BP Location: Right Arm, Patient Position: Sitting, Cuff Size: Large)   Pulse 72   Wt 205 lb 12.8 oz (93.4 kg)   SpO2 96%   BMI 38.89 kg/m   BP Readings from Last 3 Encounters:  12/09/23 125/83  08/18/23 119/78  08/09/23 120/71    Wt Readings from Last 3 Encounters:  12/09/23 205 lb 12.8 oz (93.4 kg)  08/18/23 213 lb 3.2 oz (96.7 kg)  08/09/23 213 lb (96.6 kg)    Physical Exam Vitals reviewed.  Constitutional:      Appearance: She is obese.  HENT:     Head: Normocephalic.     Right Ear: Tympanic membrane, ear canal and external ear normal.     Left Ear: Tympanic membrane, ear canal and external ear normal.     Nose: Nose normal.  Eyes:     Extraocular Movements: Extraocular movements intact.  Cardiovascular:     Rate and Rhythm: Normal rate and regular rhythm.  Pulmonary:  Effort: Pulmonary effort is normal.     Breath sounds: Normal breath sounds.  Abdominal:     General: Bowel sounds are normal. There is distension.     Palpations: Abdomen is soft.  Musculoskeletal:        General: Normal range of motion.     Cervical back: Normal range of motion and neck supple.  Skin:    General: Skin is warm and dry.  Neurological:     Mental Status: She is oriented to person, place, and time.  Psychiatric:        Mood and Affect: Mood normal.        Behavior: Behavior normal.        Thought  Content: Thought content normal.        Judgment: Judgment normal.     Lab Results  Component Value Date   HGBA1C 8.9 (A) 12/09/2023   HGBA1C 10.0 (A) 09/10/2023   HGBA1C 10.1 (A) 07/23/2023   Matti was seen today for medical management of chronic issues.  Diagnoses and all orders for this visit:  Type 2 diabetes mellitus without complication, with long-term current use of insulin (HCC) -     POCT glucose (manual entry) -     POCT glycosylated hemoglobin (Hb A1C) 8.9 previously 10.0 some improvement but not at goal -     CMP14+EGFR - educated on lifestyle modifications, including but not limited to diet choices and adding exercise to daily routine.    Essential hypertension Blood pressure 125/83 renal protection -     lisinopril (ZESTRIL) 2.5 MG tablet; Take 1 tablet (2.5 mg total) by mouth daily. -     CBC with Differential/Platelet -     CMP14+EGFR  Constipation, unspecified constipation type Senokot-S, increase water and fiber  Other orders -     Dulaglutide (TRULICITY) 0.75 MG/0.5ML SOAJ; Inject 0.75 mg into the skin once a week. -     sennosides-docusate sodium (SENOKOT-S) 8.6-50 MG tablet; Take 1 tablet by mouth daily.     The above assessment and management plan was discussed with the patient. The patient verbalized understanding of and has agreed to the management plan. Patient is aware to call the clinic if symptoms fail to improve or worsen. Patient is aware when to return to the clinic for a follow-up visit. Patient educated on when it is appropriate to go to the emergency department.   Gwinda Passe, NP-C

## 2023-12-10 LAB — CBC WITH DIFFERENTIAL/PLATELET
Basophils Absolute: 0.1 10*3/uL (ref 0.0–0.2)
Basos: 1 %
EOS (ABSOLUTE): 1.5 10*3/uL — ABNORMAL HIGH (ref 0.0–0.4)
Eos: 16 %
Hematocrit: 47.3 % — ABNORMAL HIGH (ref 34.0–46.6)
Hemoglobin: 14.9 g/dL (ref 11.1–15.9)
Immature Grans (Abs): 0 10*3/uL (ref 0.0–0.1)
Immature Granulocytes: 0 %
Lymphocytes Absolute: 3.5 10*3/uL — ABNORMAL HIGH (ref 0.7–3.1)
Lymphs: 39 %
MCH: 27.5 pg (ref 26.6–33.0)
MCHC: 31.5 g/dL (ref 31.5–35.7)
MCV: 87 fL (ref 79–97)
Monocytes Absolute: 0.4 10*3/uL (ref 0.1–0.9)
Monocytes: 4 %
Neutrophils Absolute: 3.6 10*3/uL (ref 1.4–7.0)
Neutrophils: 40 %
Platelets: 259 10*3/uL (ref 150–450)
RBC: 5.41 x10E6/uL — ABNORMAL HIGH (ref 3.77–5.28)
RDW: 12.8 % (ref 11.7–15.4)
WBC: 9.1 10*3/uL (ref 3.4–10.8)

## 2023-12-10 LAB — CMP14+EGFR
ALT: 13 IU/L (ref 0–32)
AST: 16 IU/L (ref 0–40)
Albumin: 4.3 g/dL (ref 3.9–4.9)
Alkaline Phosphatase: 94 IU/L (ref 44–121)
BUN/Creatinine Ratio: 11 — ABNORMAL LOW (ref 12–28)
BUN: 18 mg/dL (ref 8–27)
Bilirubin Total: 0.8 mg/dL (ref 0.0–1.2)
CO2: 23 mmol/L (ref 20–29)
Calcium: 9.6 mg/dL (ref 8.7–10.3)
Chloride: 101 mmol/L (ref 96–106)
Creatinine, Ser: 1.6 mg/dL — ABNORMAL HIGH (ref 0.57–1.00)
Globulin, Total: 3.6 g/dL (ref 1.5–4.5)
Glucose: 140 mg/dL — ABNORMAL HIGH (ref 70–99)
Potassium: 4.8 mmol/L (ref 3.5–5.2)
Sodium: 139 mmol/L (ref 134–144)
Total Protein: 7.9 g/dL (ref 6.0–8.5)
eGFR: 34 mL/min/{1.73_m2} — ABNORMAL LOW (ref >59)

## 2023-12-18 ENCOUNTER — Ambulatory Visit: Payer: No Typology Code available for payment source | Admitting: Pharmacist

## 2023-12-21 ENCOUNTER — Ambulatory Visit (INDEPENDENT_AMBULATORY_CARE_PROVIDER_SITE_OTHER): Payer: Self-pay | Admitting: Primary Care

## 2023-12-21 NOTE — Telephone Encounter (Signed)
 This RN called pt and pt daughter answer. Pt daughter states she will call back in five minutes.    Copied from CRM [id number]. Topic: Clinical - Medical Advice >> Dec 21, 2023  3:29 PM Franchot Heidelberg wrote: Reason for CRM: Pt's daughter called requesting to speak to a nurse regarding the pt's cough.   Best contact: [phone]

## 2023-12-21 NOTE — Telephone Encounter (Signed)
 Chief Complaint: Cough with white phlegm Symptoms: cough, headache, mild shortness of breath Frequency: a few days Pertinent Negatives: Patient denies fever Disposition: [] ED /[] Urgent Care (no appt availability in office) / [] Appointment(In office/virtual)/ []  [facility]/ [x] Home Care/ [] Refused Recommended Disposition /[] [facility]/ []  Follow-up with PCP Additional Notes: Patient's daughter called, stating patient has had a cough for a few days. Patient states her chest is hurting with coughing and is experiencing a headache. Patient has mild weakness. Patient denies fever. Daughter states cough is managed by medication. Advised patient's daughter to continue monitoring patient and call back if symptoms worsen.

## 2023-12-21 NOTE — Telephone Encounter (Signed)
 Reason for Disposition . Cough  Answer Assessment - Initial Assessment Questions 1. ONSET: "When did the cough begin?"      A few days ago 2. SEVERITY: "How bad is the cough today?"       N/a 3. SPUTUM: "Describe the color of your sputum" (none, dry cough; clear, white, yellow, green)     White 4. HEMOPTYSIS: "Are you coughing up any blood?" If so ask: "How much?" (flecks, streaks, tablespoons, etc.)     No 5. DIFFICULTY BREATHING: "Are you having difficulty breathing?" If Yes, ask: "How bad is it?" (e.g., mild, moderate, severe)    - MILD: No SOB at rest, mild SOB with walking, speaks normally in sentences, can lie down, no retractions, pulse < 100.    - MODERATE: SOB at rest, SOB with minimal exertion and prefers to sit, cannot lie down flat, speaks in phrases, mild retractions, audible wheezing, pulse 100-120.    - SEVERE: Very SOB at rest, speaks in single words, struggling to breathe, sitting hunched forward, retractions, pulse > 120      Mild - using inhaler 6. FEVER: "Do you have a fever?" If Yes, ask: "What is your temperature, how was it measured, and when did it start?"     No 7. CARDIAC HISTORY: "Do you have any history of heart disease?" (e.g., heart attack, congestive heart failure)      Double by-pass in 2019 8. LUNG HISTORY: "Do you have any history of lung disease?"  (e.g., pulmonary embolus, asthma, emphysema)     No 9. PE RISK FACTORS: "Do you have a history of blood clots?" (or: recent major surgery, recent prolonged travel, bedridden)     No 10. OTHER SYMPTOMS: "Do you have any other symptoms?" (e.g., runny nose, wheezing, chest pain)       Headache, chest pain while coughing, weakness 12. TRAVEL: "Have you traveled out of the country in the last month?" (e.g., travel history, exposures)       No  Protocols used: Cough - Acute Productive-A-AH

## 2023-12-22 NOTE — Telephone Encounter (Signed)
 noted

## 2024-01-08 ENCOUNTER — Ambulatory Visit: Payer: Self-pay | Attending: Primary Care | Admitting: Pharmacist

## 2024-01-08 ENCOUNTER — Other Ambulatory Visit: Payer: Self-pay

## 2024-01-08 ENCOUNTER — Encounter: Payer: Self-pay | Admitting: Pharmacist

## 2024-01-08 DIAGNOSIS — E119 Type 2 diabetes mellitus without complications: Secondary | ICD-10-CM

## 2024-01-08 DIAGNOSIS — Z794 Long term (current) use of insulin: Secondary | ICD-10-CM

## 2024-01-08 DIAGNOSIS — Z7985 Long-term (current) use of injectable non-insulin antidiabetic drugs: Secondary | ICD-10-CM

## 2024-01-08 LAB — POCT GLYCOSYLATED HEMOGLOBIN (HGB A1C): HbA1c, POC (controlled diabetic range): 10.3 % — AB (ref 0.0–7.0)

## 2024-01-08 MED ORDER — BASAGLAR KWIKPEN 100 UNIT/ML ~~LOC~~ SOPN
20.0000 [IU] | PEN_INJECTOR | Freq: Every day | SUBCUTANEOUS | 1 refills | Status: DC
  Filled 2024-01-08: qty 6, 30d supply, fill #0
  Filled 2024-02-08: qty 6, 30d supply, fill #1

## 2024-01-08 MED ORDER — TRULICITY 1.5 MG/0.5ML ~~LOC~~ SOAJ
1.5000 mg | SUBCUTANEOUS | 1 refills | Status: DC
  Filled 2024-01-08: qty 2, 28d supply, fill #0
  Filled 2024-02-08: qty 2, 28d supply, fill #1
  Filled 2024-03-01: qty 2, 28d supply, fill #2

## 2024-01-08 NOTE — Progress Notes (Signed)
 S:     No chief complaint on file.  73 y.o. female who presents for diabetes evaluation, education, and management in the context of the LIBERATE Study. This is her 3 month follow-up. PMH is significant for T2DM, HTN, NSTEMI (CAD s/p CABG) in 2019, HLD, CRI (chronic renal insufficiency) stage 3.  Patient was referred and last seen by Primary Care Provider, Gwinda Passe, on 12/09/2023. Today, patient arrives in good spirits and presents with her daughter.  Patient reports Diabetes is longstanding. Reports adherence to her Trulicity and Basaglar, however, her A1c today is up. Sensors worked for the first [employer] of our study period but then would no longer sync to patient's phone. She brings in her daughter to re-sync sensors and for further instruction. Unfortunately, she endorses symptoms of hyperglycemia as summarized below. She is also having some lower back and neck pain. Headaches as well. Denies any NV, abdominal pain with the Trulicity. Denies any changes in vision.   Family/Social History:  Fhx: HTN Tobacco: never smoker Alcohol: none reported  Current diabetes medications include: Trulicity 0.75 mg weekly, Basaglar 14 units daily  Current hypertension medications include: lisinopril 2.5 mg daily Current hyperlipidemia medications include: atorvastatin 40 mg daily   Patient reports adherence to taking all medications as prescribed.   Insurance coverage: self pay  Patient denies hypoglycemic events.  CGM Study Study visit: 3 Month Follow Up Visit  CGM Data Download date: 01/08/2024 - 90 day report % Time CGM Is Active: 31 % Average glucose (mg/dL): 629 mg/dL Glucose Management Indicator (%): 8 % Glucose Variability (%): 25.3 % Time Above Range >180 mg/dL (%): 57 % Time in Range 70-180 mg/dL (%): 43 % Time Below Range <70 mg/dL (%): 0 %  Diabetes Distress Scale Feeling like diabetes is taking up too much of my mental and physical energy every day.: Not a  problem Feeling that my doctor doesn't know enough about diabetes and diabetic care. : A very serious problem Feeling angry, scared, and/or depressed when I think about living with diabetes : Not a problem Feeling that my doctor doesn't give my clear directions on how to manage my diabetes. : A very serious problem Feeling that im not testing my blood sugars frequently enough.: A moderate problem Feeling that I'm often failing with my diabetes routine: A moderate problem Feelling that friends and family are not supportive enough of self care efforts.: Not a problem Feeling that diabetes controls my life.: A serious problem Feeling that my doctor doesn't take my concerns seriously enough: Not a problem Not feeling confident in my day to day ability to manage diabetes: A slight problem Feeling that I will end up with serious long term complications no matter what I do.: A slight problem Feeling that I am not sticking closely enough to a good meal plan.: A moderate problem Feeling that friends or family don't appreciate how difficult living with diabetes can be. : A slight problem Feeling overwhelmed by the demands of living with diabetes.: A slight problem Feeling that I don't have a doctor who I can see.: Not a problem Not feeling motivated to keep up my diabetes self management.: A slight problem Feeling that friends or family don't give me the emotional support that I would like. : A slight problem DDS17 Score: 43 Emotional Burden Score: 2.2 Physician related distress score: 3.5 Regimen Related Distress score : 2.6 Interpersonal distress score: 1.67   Patient reports nocturia (nighttime urination).  Patient reports neuropathy (nerve pain).  Patient reports visual changes. Patient reports self foot exams.   Patient reported dietary habits: Eats 3 meals/day  Patient-reported exercise habits:  -none  O:   Lab Results  Component Value Date   HGBA1C 10.3 (A) 01/08/2024    There  were no vitals filed for this visit.   Lipid Panel     Component Value Date/Time   CHOL 124 11/02/2023 0935   TRIG 93 11/02/2023 0935   HDL 35 (L) 11/02/2023 0935   CHOLHDL 3.5 11/02/2023 0935   CHOLHDL 3.5 07/30/2018 0335   VLDL 27 07/30/2018 0335   LDLCALC 71 11/02/2023 0935    Clinical Atherosclerotic Cardiovascular Disease (ASCVD): Yes  The ASCVD Risk score (Arnett DK, et al., 2019) failed to calculate for the following reasons:   Risk score cannot be calculated because patient has a medical history suggesting prior/existing ASCVD   Patient is participating in a Managed Medicaid Plan: No     A/P:  LIBERATE Study:  - 4 sensors provided for a 2 month supply. Educated to contact the office if the sensor falls off early and replacements are needed before their next [employer]. She was due for Liberate 3 month follow-up last month but I was out of clinic. She will be due for her 6 month follow-up in June.  Diabetes longstanding currently uncontrolled. Patient is endorsing symptomatic hyperglycemia and needs additional sensor training. After troubleshooting, we learned that he phone's bluetooth was off. Emphasized to keep this active to ensure proper CGM function. She is not currently hypoglycemic but is able to verbalize appropriate hypoglycemia management plan. Medication adherence appears to be appropriate. -Increased dose of Basaglar to 20 units daily. -Increased dose of Trulicity to 1.5 mg weekly. -Patient educated on purpose, proper use, and potential adverse effects of Basaglar, Trulicity.  -Extensively discussed pathophysiology of diabetes, recommended lifestyle interventions, dietary effects on blood sugar control.  -Counseled on s/sx of and management of hypoglycemia.  -Next A1c anticipated 02/2024.   Written patient instructions provided. Patient verbalized understanding of treatment plan.  Total time in face to face counseling 30 minutes.    Follow-up:   Pharmacist in 1 month.  Butch Penny, PharmD, Patsy [place], CPP Clinical Pharmacist [facility] [phone]

## 2024-01-10 ENCOUNTER — Other Ambulatory Visit (INDEPENDENT_AMBULATORY_CARE_PROVIDER_SITE_OTHER): Payer: Self-pay | Admitting: Primary Care

## 2024-01-10 DIAGNOSIS — M542 Cervicalgia: Secondary | ICD-10-CM

## 2024-01-10 DIAGNOSIS — M545 Low back pain, unspecified: Secondary | ICD-10-CM

## 2024-01-19 ENCOUNTER — Other Ambulatory Visit: Payer: Self-pay

## 2024-02-07 NOTE — Progress Notes (Unsigned)
 S:     No chief complaint on file.  73 y.o. female who presents for diabetes evaluation, education, and management. She is enrolled in the [facility] Study. Her 3 month follow-up visit was on 01/08/24. PMH is significant for T2DM, HTN, NSTEMI (CAD s/p CABG) in 2019, HLD, CRI (chronic renal insufficiency) stage 3.  Patient was referred and last seen by Primary Care Provider, Madelyn Schick, on 12/09/2023. At last pharmacy visit on 01/08/24, patient's A1C had increased from 8.9% to 10.3%. She required additional sensor training (I.e. keeping phone bluetooth on). She was instructed to increase Basaglar  to 20 units daily and Trulicity  to 1.5 units weekly.  Both of these medications were dispensed from [facility] pharmacy after the visit.   Today, patient arrives in good spirits and presents with her daughter.***  Adherence? On atorva 40 for ASCVD secondary prevention eGFR was 34 last mo (Cr 1.52 > 1.60), K 4.8 Recheck BP?  OLD: Patient reports Diabetes is longstanding. Reports adherence to her Trulicity  and Basaglar , however, her A1c today is up. Sensors worked for the first [employer] of our study period but then would no longer sync to patient's phone. She brings in her daughter to re-sync sensors and for further instruction. Unfortunately, she endorses symptoms of hyperglycemia as summarized below. She is also having some lower back and neck pain. Headaches as well. Denies any NV, abdominal pain with the Trulicity . Denies any changes in vision.   Family/Social History:  Fhx: HTN Tobacco: never smoker Alcohol: none reported  Current diabetes medications include: Trulicity  0.75 mg weekly, Basaglar  14 units daily  Current hypertension medications include: lisinopril  2.5 mg daily Current hyperlipidemia medications include: atorvastatin  40 mg daily   Patient reports adherence to taking all medications as prescribed.   Insurance coverage: self pay  Patient denies hypoglycemic events.     Patient  reports nocturia (nighttime urination).  Patient reports neuropathy (nerve pain). Patient reports visual changes. Patient reports self foot exams.   Patient reported dietary habits: Eats 3 meals/day  Patient-reported exercise habits:  -none  O:   Date of Download: 02/07/24 % Time CGM is active: 25% (only active 02/07/24 to 02/10/24) Average Glucose: 324 mg/dL Glucose Management Indicator: ***  Glucose Variability: 22 (goal <36%) Time in Goal:  - Time in range 70-180: 2% - Time above range: 98% - Time below range: 0% Observed patterns:      Lab Results  Component Value Date   HGBA1C 10.3 (A) 01/08/2024    There were no vitals filed for this visit.   Lipid Panel     Component Value Date/Time   CHOL 124 11/02/2023 0935   TRIG 93 11/02/2023 0935   HDL 35 (L) 11/02/2023 0935   CHOLHDL 3.5 11/02/2023 0935   CHOLHDL 3.5 07/30/2018 0335   VLDL 27 07/30/2018 0335   LDLCALC 71 11/02/2023 0935    Clinical Atherosclerotic Cardiovascular Disease (ASCVD): Yes  The ASCVD Risk score (Arnett DK, et al., 2019) failed to calculate for the following reasons:   Risk score cannot be calculated because patient has a medical history suggesting prior/existing ASCVD   Patient is participating in a Managed Medicaid Plan: No     A/P:  LIBERATE Study:  - 4 sensors provided for a 2 month supply. Educated to contact the office if the sensor falls off early and replacements are needed before their next [employer]. She was due for Liberate 3 month follow-up last month but I was out of clinic. She will be due  for her 6 month follow-up in June.  Diabetes longstanding currently uncontrolled. Patient is endorsing symptomatic hyperglycemia and needs additional sensor training. After troubleshooting, we learned that he phone's bluetooth was off. Emphasized to keep this active to ensure proper CGM function. She is not currently hypoglycemic but is able to verbalize appropriate hypoglycemia  management plan. Medication adherence appears to be appropriate. -Increased dose of Basaglar  to 20 units daily. -Increased dose of Trulicity  to 1.5 mg weekly. -Patient educated on purpose, proper use, and potential adverse effects of Basaglar , Trulicity .  -Extensively discussed pathophysiology of diabetes, recommended lifestyle interventions, dietary effects on blood sugar control.  -Counseled on s/sx of and management of hypoglycemia.  -Next A1c anticipated 02/2024.   Written patient instructions provided. Patient verbalized understanding of treatment plan.  Total time in face to face counseling 30 minutes.    Follow-up:  Pharmacist in 1 month.  Marene Shape, PharmD, Becky Bowels, CPP Clinical Pharmacist [facility] & [facility] [phone]

## 2024-02-08 ENCOUNTER — Ambulatory Visit: Payer: Self-pay | Attending: Family Medicine | Admitting: Pharmacist

## 2024-02-08 ENCOUNTER — Encounter: Payer: Self-pay | Admitting: Pharmacist

## 2024-02-08 ENCOUNTER — Other Ambulatory Visit: Payer: Self-pay

## 2024-02-08 DIAGNOSIS — Z7985 Long-term (current) use of injectable non-insulin antidiabetic drugs: Secondary | ICD-10-CM

## 2024-02-08 DIAGNOSIS — E119 Type 2 diabetes mellitus without complications: Secondary | ICD-10-CM

## 2024-02-08 DIAGNOSIS — Z794 Long term (current) use of insulin: Secondary | ICD-10-CM

## 2024-03-01 ENCOUNTER — Other Ambulatory Visit: Payer: Self-pay

## 2024-03-04 ENCOUNTER — Other Ambulatory Visit: Payer: Self-pay

## 2024-03-10 ENCOUNTER — Telehealth: Payer: Self-pay | Admitting: Primary Care

## 2024-03-10 NOTE — Telephone Encounter (Signed)
 Copied from CRM [id number]. Topic: Clinical - Medical Advice >> Mar 10, 2024 11:48 AM Dorthula Gavel H wrote: Reason for CRM: pt's daughter is requesting a call back. Call back number is [phone]

## 2024-03-14 ENCOUNTER — Encounter: Admitting: Pharmacist

## 2024-04-05 ENCOUNTER — Encounter: Payer: Self-pay | Admitting: Pharmacist

## 2024-04-05 ENCOUNTER — Ambulatory Visit: Payer: Self-pay | Attending: Primary Care | Admitting: Pharmacist

## 2024-04-05 ENCOUNTER — Other Ambulatory Visit: Payer: Self-pay

## 2024-04-05 DIAGNOSIS — Z794 Long term (current) use of insulin: Secondary | ICD-10-CM

## 2024-04-05 DIAGNOSIS — Z7985 Long-term (current) use of injectable non-insulin antidiabetic drugs: Secondary | ICD-10-CM

## 2024-04-05 DIAGNOSIS — E119 Type 2 diabetes mellitus without complications: Secondary | ICD-10-CM

## 2024-04-05 DIAGNOSIS — Z9189 Other specified personal risk factors, not elsewhere classified: Secondary | ICD-10-CM

## 2024-04-05 LAB — POCT GLYCOSYLATED HEMOGLOBIN (HGB A1C): HbA1c, POC (controlled diabetic range): 10.1 % — AB (ref 0.0–7.0)

## 2024-04-05 MED ORDER — BASAGLAR KWIKPEN 100 UNIT/ML ~~LOC~~ SOPN
18.0000 [IU] | PEN_INJECTOR | Freq: Every day | SUBCUTANEOUS | 1 refills | Status: DC
  Filled 2024-04-05: qty 6, 33d supply, fill #0

## 2024-04-05 MED ORDER — TRULICITY 0.75 MG/0.5ML ~~LOC~~ SOAJ
0.7500 mg | SUBCUTANEOUS | 6 refills | Status: DC
  Filled 2024-04-05: qty 2, 28d supply, fill #0

## 2024-04-05 NOTE — Progress Notes (Signed)
 S:     No chief complaint on file.  73 y.o. female who presents for diabetes evaluation, education, and management. She is enrolled in the [facility] Study. Her 3 month follow-up visit was due to occur in March but she did not get in to clinic until 01/08/24. I saw her again on 02/08/2024. Unfortunately, she is overdue for her final LIBERATE visit. This was supposed to occur in June but she no-showed her appt with me.   PMH is significant for T2DM, HTN, NSTEMI (CAD s/p CABG) in 2019, HLD, CRI (chronic renal insufficiency) stage 3.  Patient was originally referred by Primary Care Provider, Rosaline Bohr, on 08/18/23. She was last seen by her on 12/09/2023.  Today, patient arrives in good spirits and presents with her daughter. She endorses nausea for 1-2 days after Trulicity  at the 1.5 mg dose. She did not experience this with the 0.75 mg dose. Denies any vomiting or abdominal pain. No changes in vision. She is adherent to this and her insulin . She finished her sample supply of Libre 3 sensors and data from last month shows better control. Summary listed below.  Family/Social History:  Fhx: HTN Tobacco: never smoker Alcohol: none reported  Current diabetes medications include: Trulicity  1.5 mg weekly (Thursdays), Basaglar  20 units daily (has been taking 14 units d/t symptoms of relative hypoglycemia with the 20 units dose) Patient reports adherence to taking all medications as prescribed.   Insurance coverage: self pay  Patient denies hypoglycemic events. No objective values <70 mg/dL, however, she starts to feel symptoms in the 100s.   Patient denies nocturia (nighttime urination).  Patient reports stable neuropathy (nerve pain) - in her legs Patient denies visual changes. Patient reports self foot exams.   Patient reported dietary habits: Eats 3 meals/day  Patient-reported exercise habits:  -none  O:   Date of Download: 28 day report, active 5/23 - 03/17/24 % Time CGM is  active: 70% during the dates listed above Average Glucose: 197 mg/dL Glucose Management Indicator: 8.0 Glucose Variability: 31.1 (goal <36%) Time in Goal:  - Time in range 70-180: 47% - Time above range: 53% - Time below range: 0%  Lab Results  Component Value Date   HGBA1C 10.1 (A) 04/05/2024    There were no vitals filed for this visit.  BP Readings from Last 3 Encounters:  12/09/23 125/83  08/18/23 119/78  08/09/23 120/71    Lipid Panel     Component Value Date/Time   CHOL 124 11/02/2023 0935   TRIG 93 11/02/2023 0935   HDL 35 (L) 11/02/2023 0935   CHOLHDL 3.5 11/02/2023 0935   CHOLHDL 3.5 07/30/2018 0335   VLDL 27 07/30/2018 0335   LDLCALC 71 11/02/2023 0935    Clinical Atherosclerotic Cardiovascular Disease (ASCVD): Yes  The ASCVD Risk score (Arnett DK, et al., 2019) failed to calculate for the following reasons:   Risk score cannot be calculated because patient has a medical history suggesting prior/existing ASCVD   Patient is participating in a Managed Medicaid Plan: No     A/P: Diabetes longstanding currently uncontrolled with A1C of 10.1% today, above goal of < 7%. Unfortunately, she did not meet deadlines for LIBERATE and ran out of CGM sensors last month. However, her data from 5/23 to 03/17/24 from [place] looks encouraging. GMI is 8.0 with a time in target at 47%. This is much better than what was reported at our last visit with her. She is not currently hypoglycemic but is able to verbalize  appropriate hypoglycemia management plan.  -Increase Basaglar  to 18 units daily. -Decrease Trulicity  to 0.75 mg weekly. -Patient educated on purpose, proper use, and potential adverse effects of Basaglar , Trulicity .  -Extensively discussed pathophysiology of diabetes, recommended lifestyle interventions, dietary effects on blood sugar control.  -Counseled on s/sx of and management of hypoglycemia.  -Next A1c anticipated 06/2024.  Written patient instructions  provided. Patient verbalized understanding of treatment plan.  Total time in face to face counseling 30 minutes.    Follow-up:  Pharmacist in October. PCP: 05/09/2024  Herlene Fleeta Morris, PharmD, BCACP, CPP Clinical Pharmacist [facility] & [facility] [phone]

## 2024-04-07 ENCOUNTER — Other Ambulatory Visit (HOSPITAL_BASED_OUTPATIENT_CLINIC_OR_DEPARTMENT_OTHER)

## 2024-04-07 ENCOUNTER — Other Ambulatory Visit: Payer: No Typology Code available for payment source

## 2024-04-12 ENCOUNTER — Other Ambulatory Visit: Payer: Self-pay

## 2024-05-09 ENCOUNTER — Ambulatory Visit (INDEPENDENT_AMBULATORY_CARE_PROVIDER_SITE_OTHER): Admitting: Primary Care

## 2024-05-20 ENCOUNTER — Ambulatory Visit (INDEPENDENT_AMBULATORY_CARE_PROVIDER_SITE_OTHER): Admitting: Primary Care

## 2024-06-09 ENCOUNTER — Encounter (INDEPENDENT_AMBULATORY_CARE_PROVIDER_SITE_OTHER): Payer: Self-pay | Admitting: Primary Care

## 2024-06-09 ENCOUNTER — Other Ambulatory Visit: Payer: Self-pay

## 2024-06-09 ENCOUNTER — Ambulatory Visit (INDEPENDENT_AMBULATORY_CARE_PROVIDER_SITE_OTHER): Payer: Self-pay | Admitting: Primary Care

## 2024-06-09 VITALS — BP 112/71 | HR 70 | Temp 98.3°F | Resp 24 | Ht 61.0 in | Wt 213.0 lb

## 2024-06-09 DIAGNOSIS — E119 Type 2 diabetes mellitus without complications: Secondary | ICD-10-CM

## 2024-06-09 DIAGNOSIS — Z794 Long term (current) use of insulin: Secondary | ICD-10-CM

## 2024-06-09 DIAGNOSIS — Z760 Encounter for issue of repeat prescription: Secondary | ICD-10-CM

## 2024-06-09 DIAGNOSIS — J9801 Acute bronchospasm: Secondary | ICD-10-CM

## 2024-06-09 MED ORDER — TRULICITY 0.75 MG/0.5ML ~~LOC~~ SOAJ
0.7500 mg | SUBCUTANEOUS | 6 refills | Status: AC
  Filled 2024-06-09: qty 2, 28d supply, fill #0
  Filled 2024-07-29: qty 2, 28d supply, fill #1
  Filled 2024-09-07: qty 2, 28d supply, fill #2
  Filled 2024-10-05 (×2): qty 2, 28d supply, fill #3

## 2024-06-09 MED ORDER — ALBUTEROL SULFATE HFA 108 (90 BASE) MCG/ACT IN AERS
2.0000 | INHALATION_SPRAY | Freq: Four times a day (QID) | RESPIRATORY_TRACT | 2 refills | Status: AC | PRN
  Filled 2024-06-09: qty 6.7, 25d supply, fill #0
  Filled 2024-10-05 (×2): qty 6.7, 25d supply, fill #1

## 2024-06-09 MED ORDER — BASAGLAR KWIKPEN 100 UNIT/ML ~~LOC~~ SOPN
18.0000 [IU] | PEN_INJECTOR | Freq: Every day | SUBCUTANEOUS | 1 refills | Status: DC
  Filled 2024-06-09: qty 18, 100d supply, fill #0

## 2024-06-09 MED ORDER — PEN NEEDLES 32G X 4 MM MISC
2 refills | Status: AC
  Filled 2024-06-09: qty 100, 100d supply, fill #0

## 2024-06-09 NOTE — Progress Notes (Signed)
 F/u with PCP per Herlene.  Out of medication almost 2 mos.

## 2024-06-13 NOTE — Progress Notes (Signed)
 [facility]  Erika Bates, is a 73 y.o. female  RDW:[id number]  FMW:[id number]  DOB - Aug 28, 1951  Chief Complaint  Patient presents with   Diabetes   Medication Refill       Subjective:   Erika Bates is a 73 y.o. female here today for a follow up visit.  She has been off of her diabetes medication for over 2 weeks Denies polyuria, polydipsia, polyphasia or vision changes.  Does not check blood sugars at home. requested follow-up per clinical pharmacist.  Blood pressure is well-controlled patient has No headache, No chest pain, No abdominal pain - No Nausea, No new weakness tingling or numbness, No Cough - shortness of breath Diabetes  Medication Refill    No problems updated.  Comprehensive ROS Pertinent positive and negative noted in HPI   No Known Allergies  Past Medical History:  Diagnosis Date   Dialysis patient [facility]) 1990-1991   Hypertension    Myocardial infarction (HCC) 01/2018   Pre-diabetes     Current Outpatient Medications on File Prior to Visit  Medication Sig Dispense Refill   atorvastatin  (LIPITOR ) 40 MG tablet Take 1 tablet (40 mg total) by mouth daily. (Patient not taking: Reported on 06/09/2024) 90 tablet 3   sennosides-docusate sodium  (SENOKOT-S) 8.6-50 MG tablet Take 1 tablet by mouth daily. (Patient not taking: Reported on 06/09/2024) 90 tablet 1   No current facility-administered medications on file prior to visit.   Health Maintenance  Topic Date Due   Eye exam for diabetics  Never done   Cologuard (Stool DNA test)  Never done   Zoster (Shingles) Vaccine (1 of 2) Never done   DEXA scan (bone density measurement)  Never done   Flu Shot  04/29/2024   COVID-19 Vaccine (1 - 2024-25 season) Never done   Yearly kidney health urinalysis for diabetes  07/22/2024   Hemoglobin A1C  10/06/2024   Yearly kidney function blood test for diabetes  12/08/2024   Complete foot exam   12/08/2024   Breast Cancer Screening  08/12/2025    DTaP/Tdap/Td vaccine (2 - Td or Tdap) 08/24/2028   Pneumococcal Vaccine for age over 60  Completed   Hepatitis C Screening  Completed   HPV Vaccine  Aged Out   Meningitis B Vaccine  Aged Out    Objective:   Vitals:   06/09/24 1712  BP: 112/71  Pulse: 70  Resp: (!) 24  Temp: 98.3 F (36.8 C)  TempSrc: Oral  SpO2: 97%  Weight: 213 lb (96.6 kg)  Height: 5' 1 (1.549 m)   BP Readings from Last 3 Encounters:  06/09/24 112/71  12/09/23 125/83  08/18/23 119/78      Physical Exam Vitals reviewed.  Constitutional:      Appearance: Normal appearance. She is obese.  HENT:     Head: Normocephalic.     Right Ear: Tympanic membrane, ear canal and external ear normal.     Left Ear: Tympanic membrane, ear canal and external ear normal.     Nose: Nose normal.     Mouth/Throat:     Mouth: Mucous membranes are moist.  Eyes:     Extraocular Movements: Extraocular movements intact.     Pupils: Pupils are equal, round, and reactive to light.  Cardiovascular:     Rate and Rhythm: Normal rate.  Pulmonary:     Effort: Pulmonary effort is normal.     Breath sounds: Normal breath sounds.  Abdominal:     General: Bowel sounds are normal.  Palpations: Abdomen is soft.  Musculoskeletal:        General: Normal range of motion.     Cervical back: Normal range of motion.  Skin:    General: Skin is warm and dry.  Neurological:     Mental Status: She is alert and oriented to person, place, and time.  Psychiatric:        Mood and Affect: Mood normal.        Behavior: Behavior normal.        Thought Content: Thought content normal.       Assessment & Plan  Erika Bates was seen today for diabetes and medication refill.  Diagnoses and all orders for this visit:  Medication refill -     Insulin  Glargine (BASAGLAR  KWIKPEN) 100 UNIT/ML; Inject 18 Units into the skin daily. -     Dulaglutide  (TRULICITY ) 0.75 MG/0.5ML SOAJ; Inject 0.75 mg into the skin once a week. -     Insulin  Pen  Needle (PEN NEEDLES) 32G X 4 MM MISC; Use to inject insulin  once a day. -     albuterol  (VENTOLIN  HFA) 108 (90 Base) MCG/ACT inhaler; Inhale 2 puffs into the lungs every 6 (six) hours as needed for wheezing or shortness of breath.  Type 2 diabetes mellitus without complication, with long-term current use of insulin  (HCC) - educated on lifestyle modifications, including but not limited to diet choices and adding exercise to daily routine.    Bronchospasm -     albuterol  (VENTOLIN  HFA) 108 (90 Base) MCG/ACT inhaler; Inhale 2 puffs into the lungs every 6 (six) hours as needed for wheezing or shortness of breath.      Patient have been counseled extensively about nutrition and exercise. Other issues discussed during this visit include: low cholesterol diet, weight control and daily exercise, foot care, annual eye examinations at Ophthalmology, importance of adherence with medications and regular follow-up. We also discussed long term complications of uncontrolled diabetes and hypertension.   Return in about 6 weeks (around 07/21/2024) for fasting labs a1c.  The patient was given clear instructions to go to ER or return to medical center if symptoms don't improve, worsen or new problems develop. The patient verbalized understanding. The patient was told to call to get lab results if they haven't heard anything in the next week.   This note has been created with Education officer, environmental. Any transcriptional errors are unintentional.   Erika SHAUNNA Bohr, NP 06/13/2024, 11:28 PM

## 2024-06-14 ENCOUNTER — Other Ambulatory Visit: Payer: Self-pay

## 2024-06-29 ENCOUNTER — Telehealth: Payer: Self-pay | Admitting: Primary Care

## 2024-06-29 NOTE — Telephone Encounter (Signed)
 LVM to confirm appt for 10/3

## 2024-07-01 ENCOUNTER — Encounter: Payer: Self-pay | Admitting: Pharmacist

## 2024-07-01 ENCOUNTER — Ambulatory Visit: Payer: Self-pay | Attending: Primary Care | Admitting: Pharmacist

## 2024-07-01 ENCOUNTER — Other Ambulatory Visit: Payer: Self-pay

## 2024-07-01 DIAGNOSIS — E119 Type 2 diabetes mellitus without complications: Secondary | ICD-10-CM

## 2024-07-01 DIAGNOSIS — Z7985 Long-term (current) use of injectable non-insulin antidiabetic drugs: Secondary | ICD-10-CM

## 2024-07-01 DIAGNOSIS — Z794 Long term (current) use of insulin: Secondary | ICD-10-CM

## 2024-07-01 LAB — POCT GLYCOSYLATED HEMOGLOBIN (HGB A1C): HbA1c, POC (controlled diabetic range): 9.8 % — AB (ref 0.0–7.0)

## 2024-07-01 MED ORDER — TRUE METRIX BLOOD GLUCOSE TEST VI STRP
ORAL_STRIP | 6 refills | Status: AC
  Filled 2024-07-01: qty 100, 33d supply, fill #0
  Filled 2024-09-07: qty 100, 33d supply, fill #1

## 2024-07-01 MED ORDER — TRUE METRIX METER W/DEVICE KIT
PACK | 0 refills | Status: AC
  Filled 2024-07-01: qty 1, 30d supply, fill #0

## 2024-07-01 MED ORDER — TRUEPLUS LANCETS 28G MISC
6 refills | Status: AC
  Filled 2024-07-01: qty 100, 33d supply, fill #0
  Filled 2024-09-07: qty 100, 33d supply, fill #1

## 2024-07-01 NOTE — Progress Notes (Signed)
 S:     No chief complaint on file.  73 y.o. female who presents for diabetes evaluation, education, and management. She was previously enrolled in the [facility]. Her last visit with me was on 04/05/2024. I decreased her Trulicity  due to nausea and increased Basaglar  from 14 units to 18 units daily.   PMH is significant for T2DM, HTN, NSTEMI (CAD s/p CABG) in 2019, HLD, CRI (chronic renal insufficiency) stage 3.  Patient was originally referred by Primary Care Provider, Rosaline Bohr, on 08/18/23. She was last seen by her on 06/09/2024.  Today, patient arrives in good spirits and presents with her son. Nausea has resolved since decreasing the Trulicity  dose. Denies any vomiting or abdominal pain. No changes in vision. She is adherent to this and her insulin . However, she has been taking 14 units instead of 16. She has not been checking her sugar at home. Requests a meter and supplies today. Denies any symptoms of hypo- or hyperglycemia. Of note, her A1c remains above goal today. She admits she went without Trulicity  and insulin  for ~1 month due to traveling to [place]  state.   Family/Social History:  Fhx: HTN Tobacco: never smoker Alcohol: none reported  Current diabetes medications include: Trulicity  0.75 mg weekly (Thursdays), Basaglar  18 units daily (has been taking 14 units) Patient reports adherence to taking all medications as prescribed.   Insurance coverage: self pay  Patient denies hypoglycemic events. Not checking levels at home.   Patient denies nocturia (nighttime urination).  Patient reports stable neuropathy (nerve pain) - in her legs Patient denies visual changes. Patient reports self foot exams.   Patient reported dietary habits: Eats 3 meals/day  Patient-reported exercise habits:  -none  O:  Lab Results  Component Value Date   HGBA1C 10.1 (A) 04/05/2024    There were no vitals filed for this visit.  BP Readings from Last 3 Encounters:  06/09/24  112/71  12/09/23 125/83  08/18/23 119/78    Lipid Panel     Component Value Date/Time   CHOL 124 11/02/2023 0935   TRIG 93 11/02/2023 0935   HDL 35 (L) 11/02/2023 0935   CHOLHDL 3.5 11/02/2023 0935   CHOLHDL 3.5 07/30/2018 0335   VLDL 27 07/30/2018 0335   LDLCALC 71 11/02/2023 0935    Clinical Atherosclerotic Cardiovascular Disease (ASCVD): Yes  The ASCVD Risk score (Arnett DK, et al., 2019) failed to calculate for the following reasons:   Risk score cannot be calculated because patient has a medical history suggesting prior/existing ASCVD   Patient is participating in a Managed Medicaid Plan: No     A/P: Diabetes longstanding currently uncontrolled with A1C of 9.8% today, above goal of < 7%. Although it is technically improving her degree of improvement is minimal. Likely due to struggling with non-adherence. She went ~1 month without insulin  and Trulicity  prior to restarting these last month. I will not make changes, send for GM supplies, and have her return for review in 1 month. She is not currently hypoglycemic but is able to verbalize appropriate hypoglycemia management plan.  -Increase Basaglar  to 16 units daily. -Continue Trulicity  to 0.75 mg weekly. -Patient educated on purpose, proper use, and potential adverse effects of Basaglar , Trulicity .  -Extensively discussed pathophysiology of diabetes, recommended lifestyle interventions, dietary effects on blood sugar control.  -Counseled on s/sx of and management of hypoglycemia.  -Next A1c anticipated 09/2024.  Written patient instructions provided. Patient verbalized understanding of treatment plan.  Total time in face to face counseling  30 minutes.    Follow-up:  Pharmacist in 4 weeks.  Herlene Fleeta Morris, PharmD, JAQUELINE, CPP Clinical Pharmacist [facility] & [facility] [phone]

## 2024-07-07 ENCOUNTER — Ambulatory Visit (INDEPENDENT_AMBULATORY_CARE_PROVIDER_SITE_OTHER)

## 2024-07-12 ENCOUNTER — Ambulatory Visit

## 2024-07-29 ENCOUNTER — Other Ambulatory Visit: Payer: Self-pay

## 2024-07-29 ENCOUNTER — Encounter: Payer: Self-pay | Admitting: Pharmacist

## 2024-07-29 ENCOUNTER — Ambulatory Visit: Payer: Self-pay | Attending: Family Medicine | Admitting: Pharmacist

## 2024-07-29 DIAGNOSIS — I2510 Atherosclerotic heart disease of native coronary artery without angina pectoris: Secondary | ICD-10-CM

## 2024-07-29 DIAGNOSIS — E119 Type 2 diabetes mellitus without complications: Secondary | ICD-10-CM

## 2024-07-29 DIAGNOSIS — Z7985 Long-term (current) use of injectable non-insulin antidiabetic drugs: Secondary | ICD-10-CM

## 2024-07-29 DIAGNOSIS — Z760 Encounter for issue of repeat prescription: Secondary | ICD-10-CM

## 2024-07-29 DIAGNOSIS — Z794 Long term (current) use of insulin: Secondary | ICD-10-CM

## 2024-07-29 MED ORDER — BASAGLAR KWIKPEN 100 UNIT/ML ~~LOC~~ SOPN
20.0000 [IU] | PEN_INJECTOR | Freq: Every day | SUBCUTANEOUS | 1 refills | Status: AC
  Filled 2024-07-29: qty 18, 90d supply, fill #0

## 2024-07-29 NOTE — Progress Notes (Signed)
    S:     No chief complaint on file.  73 y.o. female who presents for diabetes evaluation, education, and management. She was previously enrolled in the [facility]. Her last visit with me was on 07/01/2024. A1c at that visit was 9.8 (down from 10.1). I increased her Basaglar  to 16 units daily.   PMH is significant for T2DM, HTN, NSTEMI (CAD s/p CABG) in 2019, HLD, CRI (chronic renal insufficiency) stage 3.  Patient was originally referred by Primary Care Provider, Rosaline Bohr, on 08/18/23. She was last seen by her on 06/09/2024.  Today, patient arrives in good spirits and presents with her son-in-law. Nausea has resolved since decreasing the Trulicity  dose. She denies any current NV, abdominal pain on the 0.75 mg weekly dose. No changes in vision. She is adherent to this and her insulin . She takes Basaglar  16 units daily. She has been checking her sugar at home and brings her meter for review. Denies any symptoms of hypo- or hyperglycemia.   Family/Social History:  Fhx: HTN Tobacco: never smoker Alcohol: none reported  Current diabetes medications include: Trulicity  0.75 mg weekly (Thursdays), Basaglar  16 units daily Patient reports adherence to taking all medications as prescribed.   Insurance coverage: self pay  Patient denies hypoglycemic events.    Patient denies nocturia (nighttime urination).  Patient reports stable neuropathy (nerve pain) - in her legs Patient denies visual changes. Patient reports self foot exams.   Patient reported dietary habits: Eats 3 meals/day  Patient-reported exercise habits:  -none  O:  Brings True Metrix:  7-day: 227 14-day: 218 30-day: 203  Lab Results  Component Value Date   HGBA1C 9.8 (A) 07/01/2024    There were no vitals filed for this visit.  BP Readings from Last 3 Encounters:  06/09/24 112/71  12/09/23 125/83  08/18/23 119/78    Lipid Panel     Component Value Date/Time   CHOL 124 11/02/2023 0935   TRIG 93  11/02/2023 0935   HDL 35 (L) 11/02/2023 0935   CHOLHDL 3.5 11/02/2023 0935   CHOLHDL 3.5 07/30/2018 0335   VLDL 27 07/30/2018 0335   LDLCALC 71 11/02/2023 0935    Clinical Atherosclerotic Cardiovascular Disease (ASCVD): Yes  The ASCVD Risk score (Arnett DK, et al., 2019) failed to calculate for the following reasons:   Risk score cannot be calculated because patient has a medical history suggesting prior/existing ASCVD   Patient is participating in a Managed Medicaid Plan: No     A/P: Diabetes longstanding currently uncontrolled with A1C of 9.8% earlier this month, above goal of < 7%. GM shows readings at home that are still too high. I explained our glycemic targets for her. We will continue with low-dose Trulicity  given her nausea with the higher dose and titrate insulin  instead. She is not currently hypoglycemic but is able to verbalize appropriate hypoglycemia management plan.  -INCREASE Basaglar  to 20 units daily. -Continue Trulicity  to 0.75 mg weekly. -Patient educated on purpose, proper use, and potential adverse effects of Basaglar , Trulicity .  -Extensively discussed pathophysiology of diabetes, recommended lifestyle interventions, dietary effects on blood sugar control.  -Counseled on s/sx of and management of hypoglycemia.  -Next A1c anticipated 09/2024.  Written patient instructions provided. Patient verbalized understanding of treatment plan.  Total time in face to face counseling 30 minutes.    Follow-up:  Pharmacist in 4 weeks.  Herlene Fleeta Morris, PharmD, JAQUELINE, CPP Clinical Pharmacist [facility] & [facility] [phone]

## 2024-09-06 ENCOUNTER — Ambulatory Visit: Admitting: Pharmacist

## 2024-09-07 ENCOUNTER — Other Ambulatory Visit: Payer: Self-pay

## 2024-10-04 ENCOUNTER — Telehealth (INDEPENDENT_AMBULATORY_CARE_PROVIDER_SITE_OTHER): Payer: Self-pay | Admitting: Primary Care

## 2024-10-04 NOTE — Telephone Encounter (Signed)
 Called pt to confirm appt. Pt will be present.

## 2024-10-05 ENCOUNTER — Telehealth: Payer: Self-pay

## 2024-10-05 ENCOUNTER — Encounter (INDEPENDENT_AMBULATORY_CARE_PROVIDER_SITE_OTHER): Payer: Self-pay | Admitting: Primary Care

## 2024-10-05 ENCOUNTER — Ambulatory Visit (INDEPENDENT_AMBULATORY_CARE_PROVIDER_SITE_OTHER): Payer: Self-pay | Admitting: Primary Care

## 2024-10-05 ENCOUNTER — Other Ambulatory Visit (HOSPITAL_COMMUNITY): Payer: Self-pay

## 2024-10-05 ENCOUNTER — Other Ambulatory Visit: Payer: Self-pay | Admitting: Family Medicine

## 2024-10-05 ENCOUNTER — Other Ambulatory Visit: Payer: Self-pay

## 2024-10-05 VITALS — BP 131/83 | HR 67 | Resp 16 | Ht 64.0 in | Wt 211.6 lb

## 2024-10-05 DIAGNOSIS — E119 Type 2 diabetes mellitus without complications: Secondary | ICD-10-CM

## 2024-10-05 DIAGNOSIS — E785 Hyperlipidemia, unspecified: Secondary | ICD-10-CM

## 2024-10-05 DIAGNOSIS — Z794 Long term (current) use of insulin: Secondary | ICD-10-CM

## 2024-10-05 DIAGNOSIS — I10 Essential (primary) hypertension: Secondary | ICD-10-CM

## 2024-10-05 DIAGNOSIS — Z1211 Encounter for screening for malignant neoplasm of colon: Secondary | ICD-10-CM

## 2024-10-05 LAB — POCT GLYCOSYLATED HEMOGLOBIN (HGB A1C): HbA1c, POC (controlled diabetic range): 8.8 % — AB (ref 0.0–7.0)

## 2024-10-05 MED ORDER — ATORVASTATIN CALCIUM 40 MG PO TABS
40.0000 mg | ORAL_TABLET | Freq: Every day | ORAL | 3 refills | Status: AC
  Filled 2024-10-05: qty 90, 90d supply, fill #0

## 2024-10-05 NOTE — Telephone Encounter (Signed)
 I spoke to patient's daughter, Talbert Primojimina, while she was with her mother at the appointment at [facility] today.   Lilly explained that she is interested in obtaining financial assistance for her mother who is from [place].  She has not applied for financial assistance for her in the past.  I explained to Lilly that she can apply for CAFA/ OC for her mother but she may be required to apply for Medicaid first and then present that denial to apply for CAFA/ OC.  I offered to have Eric Beckwith, Artist, call her to discuss the application process for CAFA/ OC and she was in agreement.  Lilly provided her contact : [phone].  Message sent to [facility] requesting he contact Lilly and he responded stating he would reach out to her,

## 2024-10-05 NOTE — Progress Notes (Signed)
 "  Subjective:  Patient ID: Erika Bates, female    DOB: 1951/02/25  Age: 74 y.o. MRN: [id number]  CC: Diabetes (A1C-8.8) and Dental Pain   Adalynd Donahoe presents forFollow-up of diabetes. Patient does not check blood sugar at home HPI  Compliant with meds - Yes Checking CBGs? No  Fasting avg -   Postprandial average -  Exercising regularly? - No Watching carbohydrate intake? - Yes Neuropathy ? - No Hypoglycemic events - No  - Recovers with :   Pertinent ROS:  Polyuria - No Polydipsia - No Vision problems - No She also c/o dental pain. Medications as noted below. Taking them regularly without complication/adverse reaction being reported today.   History Erika Bates has a past medical history of Dialysis patient ([id number]), Hypertension, Myocardial infarction (HCC) (01/2018), and Pre-diabetes.   She has a past surgical history that includes LEFT HEART CATH AND CORONARY ANGIOGRAPHY (N/A, 02/16/2018); CORONARY STENT INTERVENTION (N/A, 02/16/2018); Tubal ligation; Abdominal exploration surgery (>1990); Cardiac catheterization (07/29/2018); LEFT HEART CATH AND CORONARY ANGIOGRAPHY (N/A, 07/29/2018); Coronary artery bypass graft (N/A, 08/02/2018); and TEE without cardioversion (N/A, 08/02/2018).   Her family history includes Hypertension in her father.She reports that she has never smoked. She has never used smokeless tobacco. She reports that she does not drink alcohol and does not use drugs.  Medications Ordered Prior to Encounter[1]  Review of Systems Comprehensive ROS Pertinent positive and negative noted in HPI   Objective:  BP 131/83   Pulse 67   Resp 16   Ht 5' 4 (1.626 m)   Wt 211 lb 9.6 oz (96 kg)   SpO2 100%   BMI 36.32 kg/m   BP Readings from Last 3 Encounters:  10/05/24 131/83  06/09/24 112/71  12/09/23 125/83    Wt Readings from Last 3 Encounters:  10/05/24 211 lb 9.6 oz (96 kg)  06/09/24 213 lb (96.6 kg)  12/09/23 205 lb 12.8 oz (93.4 kg)    Physical  Exam Vitals reviewed.  Constitutional:      Appearance: Normal appearance. She is obese.  HENT:     Head: Normocephalic.     Right Ear: Tympanic membrane, ear canal and external ear normal.     Left Ear: Tympanic membrane, ear canal and external ear normal.     Nose: Nose normal.     Mouth/Throat:     Mouth: Mucous membranes are moist.  Eyes:     Extraocular Movements: Extraocular movements intact.     Pupils: Pupils are equal, round, and reactive to light.  Cardiovascular:     Rate and Rhythm: Normal rate and regular rhythm.  Pulmonary:     Effort: Pulmonary effort is normal.     Breath sounds: Normal breath sounds.  Abdominal:     General: Bowel sounds are normal.     Palpations: Abdomen is soft.  Musculoskeletal:        General: Normal range of motion.     Cervical back: Normal range of motion and neck supple.  Skin:    General: Skin is warm and dry.  Neurological:     Mental Status: She is alert and oriented to person, place, and time.  Psychiatric:        Mood and Affect: Mood normal.        Behavior: Behavior normal.        Thought Content: Thought content normal.     Lab Results  Component Value Date   HGBA1C 8.8 (A) 10/05/2024   HGBA1C 9.8 (A) 07/01/2024  HGBA1C 10.1 (A) 04/05/2024    Lab Results  Component Value Date   WBC 7.8 10/05/2024   HGB 13.5 10/05/2024   HCT 41.8 10/05/2024   PLT 236 10/05/2024   GLUCOSE 210 (H) 10/05/2024   CHOL 223 (H) 10/05/2024   TRIG 198 (H) 10/05/2024   HDL 48 10/05/2024   LDLCALC 139 (H) 10/05/2024   ALT 33 (H) 10/05/2024   AST 35 10/05/2024   NA 140 10/05/2024   K 4.5 10/05/2024   CL 103 10/05/2024   CREATININE 1.42 (H) 10/05/2024   BUN 17 10/05/2024   CO2 26 10/05/2024   INR 1.51 08/02/2018   HGBA1C 8.8 (A) 10/05/2024    Title   Diabetic Foot Exam - detailed    Semmes-Weinstein Monofilament Test + means has sensation and - means no sensation      Image components are not supported.   Image  components are not supported. Image components are not supported.  [place] Comments      Assessment & Plan:  Mihika was seen today for diabetes and dental pain.  Diagnoses and all orders for this visit:  Type 2 diabetes mellitus without complication, with long-term current use of insulin  (HCC) -     POCT glycosylated hemoglobin (Hb A1C) -     Microalbumin / creatinine urine ratio -     CBC with Differential/Platelet  Hyperlipidemia LDL goal <70 -     Lipid panel  Essential hypertension controlled -     CMP14+EGFR  Colon cancer screening -     Fecal occult blood, imunochemical; Future    Follow-up:  Return in about 3 months (around 01/03/2025) for fasting labs.  The above assessment and management plan was discussed with the patient. The patient verbalized understanding of and has agreed to the management plan. Patient is aware to call the clinic if symptoms fail to improve or worsen. Patient is aware when to return to the clinic for a follow-up visit. Patient educated on when it is appropriate to go to the emergency department.   Rosaline Bohr, NP-C      [1]  Current Outpatient Medications on File Prior to Visit  Medication Sig Dispense Refill   albuterol  (VENTOLIN  HFA) 108 (90 Base) MCG/ACT inhaler Inhale 2 puffs into the lungs every 6 (six) hours as needed for wheezing or shortness of breath. 6.7 g 2   Blood Glucose Monitoring Suppl (TRUE METRIX METER) w/Device KIT Use to check blood sugar 3 times daily. 1 kit 0   Dulaglutide  (TRULICITY ) 0.75 MG/0.5ML SOAJ Inject 0.75 mg into the skin once a week. 2 mL 6   glucose blood (TRUE METRIX BLOOD GLUCOSE TEST) test strip Use to check blood sugar 3 times daily. 100 each 6   Insulin  Glargine (BASAGLAR  KWIKPEN) 100 UNIT/ML Inject 20 Units into the skin daily. 18 mL 1   Insulin  Pen Needle (PEN NEEDLES) 32G X 4 MM MISC Use to inject insulin  once a day. 100 each 2   sennosides-docusate sodium  (SENOKOT-S) 8.6-50 MG tablet  Take 1 tablet by mouth daily. (Patient not taking: Reported on 06/09/2024) 90 tablet 1   TRUEplus Lancets 28G MISC Use to check blood sugar 3 times daily. 100 each 6   No current facility-administered medications on file prior to visit.   "

## 2024-10-06 LAB — CBC WITH DIFFERENTIAL/PLATELET
Basophils Absolute: 0.1 x10E3/uL (ref 0.0–0.2)
Basos: 1 %
EOS (ABSOLUTE): 0.3 x10E3/uL (ref 0.0–0.4)
Eos: 4 %
Hematocrit: 41.8 % (ref 34.0–46.6)
Hemoglobin: 13.5 g/dL (ref 11.1–15.9)
Immature Grans (Abs): 0.1 x10E3/uL (ref 0.0–0.1)
Immature Granulocytes: 1 %
Lymphocytes Absolute: 4.1 x10E3/uL — ABNORMAL HIGH (ref 0.7–3.1)
Lymphs: 52 %
MCH: 29.2 pg (ref 26.6–33.0)
MCHC: 32.3 g/dL (ref 31.5–35.7)
MCV: 91 fL (ref 79–97)
Monocytes Absolute: 0.5 x10E3/uL (ref 0.1–0.9)
Monocytes: 6 %
Neutrophils Absolute: 2.8 x10E3/uL (ref 1.4–7.0)
Neutrophils: 36 %
Platelets: 236 x10E3/uL (ref 150–450)
RBC: 4.62 x10E6/uL (ref 3.77–5.28)
RDW: 12.7 % (ref 11.7–15.4)
WBC: 7.8 x10E3/uL (ref 3.4–10.8)

## 2024-10-06 LAB — MICROALBUMIN / CREATININE URINE RATIO
Creatinine, Urine: 88.4 mg/dL
Microalb/Creat Ratio: 74 mg/g{creat} — ABNORMAL HIGH (ref 0–29)
Microalbumin, Urine: 65.2 ug/mL

## 2024-10-06 LAB — CMP14+EGFR
ALT: 33 IU/L — ABNORMAL HIGH (ref 0–32)
AST: 35 IU/L (ref 0–40)
Albumin: 4 g/dL (ref 3.8–4.8)
Alkaline Phosphatase: 91 IU/L (ref 49–135)
BUN/Creatinine Ratio: 12 (ref 12–28)
BUN: 17 mg/dL (ref 8–27)
Bilirubin Total: <0.2 mg/dL (ref 0.0–1.2)
CO2: 26 mmol/L (ref 20–29)
Calcium: 9 mg/dL (ref 8.7–10.3)
Chloride: 103 mmol/L (ref 96–106)
Creatinine, Ser: 1.42 mg/dL — ABNORMAL HIGH (ref 0.57–1.00)
Globulin, Total: 3.4 g/dL (ref 1.5–4.5)
Glucose: 210 mg/dL — ABNORMAL HIGH (ref 70–99)
Potassium: 4.5 mmol/L (ref 3.5–5.2)
Sodium: 140 mmol/L (ref 134–144)
Total Protein: 7.4 g/dL (ref 6.0–8.5)
eGFR: 40 mL/min/1.73 — ABNORMAL LOW

## 2024-10-06 LAB — LIPID PANEL
Chol/HDL Ratio: 4.6 ratio — ABNORMAL HIGH (ref 0.0–4.4)
Cholesterol, Total: 223 mg/dL — ABNORMAL HIGH (ref 100–199)
HDL: 48 mg/dL
LDL Chol Calc (NIH): 139 mg/dL — ABNORMAL HIGH (ref 0–99)
Triglycerides: 198 mg/dL — ABNORMAL HIGH (ref 0–149)
VLDL Cholesterol Cal: 36 mg/dL (ref 5–40)

## 2024-10-10 ENCOUNTER — Ambulatory Visit (INDEPENDENT_AMBULATORY_CARE_PROVIDER_SITE_OTHER): Payer: Self-pay | Admitting: Primary Care

## 2024-10-12 ENCOUNTER — Other Ambulatory Visit: Payer: Self-pay

## 2024-10-17 ENCOUNTER — Encounter (INDEPENDENT_AMBULATORY_CARE_PROVIDER_SITE_OTHER): Payer: Self-pay | Admitting: Primary Care

## 2025-01-03 ENCOUNTER — Ambulatory Visit (INDEPENDENT_AMBULATORY_CARE_PROVIDER_SITE_OTHER): Payer: Self-pay | Admitting: Primary Care
